# Patient Record
Sex: Male | Born: 1960
Health system: Southern US, Community
[De-identification: ages and names within clinical notes are randomized; demographics above are authoritative.]

## PROBLEM LIST (undated history)

## (undated) DIAGNOSIS — K219 Gastro-esophageal reflux disease without esophagitis: Secondary | ICD-10-CM

## (undated) DIAGNOSIS — M4802 Spinal stenosis, cervical region: Secondary | ICD-10-CM

## (undated) DIAGNOSIS — I10 Essential (primary) hypertension: Secondary | ICD-10-CM

## (undated) DIAGNOSIS — N189 Chronic kidney disease, unspecified: Secondary | ICD-10-CM

## (undated) DIAGNOSIS — I509 Heart failure, unspecified: Secondary | ICD-10-CM

## (undated) DIAGNOSIS — G4730 Sleep apnea, unspecified: Secondary | ICD-10-CM

## (undated) DIAGNOSIS — C801 Malignant (primary) neoplasm, unspecified: Secondary | ICD-10-CM

## (undated) DIAGNOSIS — E785 Hyperlipidemia, unspecified: Secondary | ICD-10-CM

## (undated) DIAGNOSIS — E079 Disorder of thyroid, unspecified: Secondary | ICD-10-CM

## (undated) DIAGNOSIS — F419 Anxiety disorder, unspecified: Secondary | ICD-10-CM

## (undated) DIAGNOSIS — F329 Major depressive disorder, single episode, unspecified: Secondary | ICD-10-CM

## (undated) DIAGNOSIS — F32A Depression, unspecified: Secondary | ICD-10-CM

## (undated) DIAGNOSIS — E039 Hypothyroidism, unspecified: Secondary | ICD-10-CM

## (undated) HISTORY — PX: TONSILLECTOMY: SUR1361

## (undated) HISTORY — DX: Depression, unspecified: F32.A

## (undated) HISTORY — DX: Gastro-esophageal reflux disease without esophagitis: K21.9

## (undated) HISTORY — DX: Essential (primary) hypertension: I10

## (undated) HISTORY — DX: Spinal stenosis, cervical region: M48.02

## (undated) HISTORY — DX: Chronic kidney disease, unspecified: N18.9

## (undated) HISTORY — DX: Hyperlipidemia, unspecified: E78.5

## (undated) HISTORY — DX: Anxiety disorder, unspecified: F41.9

## (undated) HISTORY — DX: Disorder of thyroid, unspecified: E07.9

## (undated) HISTORY — PX: WISDOM TOOTH EXTRACTION: SHX21

## (undated) HISTORY — PX: OTHER SURGICAL HISTORY: SHX169

## (undated) HISTORY — DX: Morbid (severe) obesity due to excess calories: E66.01

## (undated) HISTORY — DX: Major depressive disorder, single episode, unspecified: F32.9

---

## 2008-09-15 ENCOUNTER — Ambulatory Visit (HOSPITAL_COMMUNITY): Admission: RE | Admit: 2008-09-15 | Discharge: 2008-09-15 | Payer: Self-pay | Admitting: Internal Medicine

## 2008-09-15 ENCOUNTER — Ambulatory Visit: Payer: Self-pay | Admitting: Internal Medicine

## 2008-10-09 HISTORY — PX: COLONOSCOPY WITH ESOPHAGOGASTRODUODENOSCOPY (EGD): SHX5779

## 2008-11-17 DIAGNOSIS — Z9089 Acquired absence of other organs: Secondary | ICD-10-CM | POA: Insufficient documentation

## 2008-11-17 DIAGNOSIS — K219 Gastro-esophageal reflux disease without esophagitis: Secondary | ICD-10-CM

## 2008-11-17 DIAGNOSIS — R1032 Left lower quadrant pain: Secondary | ICD-10-CM

## 2008-11-17 DIAGNOSIS — J40 Bronchitis, not specified as acute or chronic: Secondary | ICD-10-CM

## 2008-11-17 DIAGNOSIS — R112 Nausea with vomiting, unspecified: Secondary | ICD-10-CM

## 2008-11-17 DIAGNOSIS — E039 Hypothyroidism, unspecified: Secondary | ICD-10-CM | POA: Insufficient documentation

## 2008-11-17 DIAGNOSIS — R197 Diarrhea, unspecified: Secondary | ICD-10-CM

## 2009-02-23 ENCOUNTER — Ambulatory Visit: Payer: Self-pay | Admitting: Internal Medicine

## 2009-02-23 ENCOUNTER — Encounter: Payer: Self-pay | Admitting: Gastroenterology

## 2009-02-23 DIAGNOSIS — Z8719 Personal history of other diseases of the digestive system: Secondary | ICD-10-CM

## 2009-02-26 LAB — CONVERTED CEMR LAB
Eosinophils Relative: 4 % (ref 0–5)
HCT: 43.1 % (ref 39.0–52.0)
Hemoglobin: 14.8 g/dL (ref 13.0–17.0)
Lymphocytes Relative: 43 % (ref 12–46)
Lymphs Abs: 1.9 10*3/uL (ref 0.7–4.0)
Monocytes Absolute: 0.5 10*3/uL (ref 0.1–1.0)
RBC: 5.14 M/uL (ref 4.22–5.81)
Tissue Transglutaminase Ab, IgA: 0.3 units (ref <7)
WBC: 4.5 10*3/uL (ref 4.0–10.5)

## 2009-03-04 ENCOUNTER — Encounter: Payer: Self-pay | Admitting: Internal Medicine

## 2009-03-04 ENCOUNTER — Telehealth (INDEPENDENT_AMBULATORY_CARE_PROVIDER_SITE_OTHER): Payer: Self-pay | Admitting: *Deleted

## 2009-03-04 ENCOUNTER — Ambulatory Visit: Payer: Self-pay | Admitting: Internal Medicine

## 2009-03-04 ENCOUNTER — Ambulatory Visit (HOSPITAL_COMMUNITY): Admission: RE | Admit: 2009-03-04 | Discharge: 2009-03-04 | Payer: Self-pay | Admitting: Internal Medicine

## 2009-03-05 ENCOUNTER — Encounter: Payer: Self-pay | Admitting: Internal Medicine

## 2009-05-14 ENCOUNTER — Ambulatory Visit: Payer: Self-pay | Admitting: Internal Medicine

## 2009-05-14 DIAGNOSIS — K589 Irritable bowel syndrome without diarrhea: Secondary | ICD-10-CM

## 2009-06-15 ENCOUNTER — Encounter: Payer: Self-pay | Admitting: Internal Medicine

## 2009-07-23 ENCOUNTER — Encounter: Payer: Self-pay | Admitting: Urgent Care

## 2009-09-20 ENCOUNTER — Ambulatory Visit: Payer: Self-pay | Admitting: Psychiatry

## 2009-09-20 ENCOUNTER — Inpatient Hospital Stay (HOSPITAL_COMMUNITY): Admission: RE | Admit: 2009-09-20 | Discharge: 2009-09-23 | Payer: Self-pay | Admitting: Psychiatry

## 2010-06-15 ENCOUNTER — Observation Stay (HOSPITAL_COMMUNITY)
Admission: EM | Admit: 2010-06-15 | Discharge: 2010-06-16 | Payer: Self-pay | Source: Home / Self Care | Admitting: Emergency Medicine

## 2010-06-28 ENCOUNTER — Ambulatory Visit: Payer: Self-pay | Admitting: Cardiology

## 2010-06-28 ENCOUNTER — Encounter: Payer: Self-pay | Admitting: Adult Health

## 2010-06-28 DIAGNOSIS — R079 Chest pain, unspecified: Secondary | ICD-10-CM

## 2010-06-28 DIAGNOSIS — F319 Bipolar disorder, unspecified: Secondary | ICD-10-CM | POA: Insufficient documentation

## 2010-07-07 ENCOUNTER — Ambulatory Visit: Payer: Self-pay | Admitting: Cardiology

## 2010-07-26 ENCOUNTER — Ambulatory Visit: Payer: Self-pay | Admitting: Cardiology

## 2010-11-08 NOTE — Assessment & Plan Note (Signed)
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  He followed up with [place] and a stress test was ordered.  He is here for the results.  He has been asymptomatic since being seen without recurrent chest pain.  Current Medications (verified): 1)  Synthroid 175 Mcg Tabs (Levothyroxine Sodium) .... Once Daily 2)  Lomotil 2.5-0.025 Mg Tabs (Diphenoxylate-Atropine) .... Take As Needed 3)  Promethazine Hcl 25 Mg Tabs (Promethazine Hcl) .... One By Mouth Every 4-6 Hours As Needed For N/v 4)  Multivitamin .... Take 1 Tablet By Mouth Once A Day 5)  Alprazolam 1 Mg Tabs (Alprazolam) .... Take 1 Tab Two Times A Day 6)  Lexapro 20 Mg Tabs (Escitalopram Oxalate) .... Take 1 Tab Daily 7)  Abilify 5 Mg Tabs (Aripiprazole) .... Take 1/2 Tab Daily 8)  Nitrostat 0.4 Mg Subl (Nitroglycerin) .Marland Kitchen.. 1 Tablet Under Tongue At Onset of Chest Pain; You May Repeat Every 5 Minutes For Up To 3 Doses. 9)  Trazodone Hcl 100 Mg Tabs (Trazodone Hcl) .Marland Kitchen.. 1 By Mouth At Bedtime 10)  Nexium 40 Mg Cpdr (Esomeprazole Magnesium) .Marland Kitchen.. 1daily  Allergies (verified): No Known Drug Allergies  Comments:  Nurse/Medical Assistant: reviewed meds with patient from previous ov didn't bring meds or list   Review of Systems       All other systems have been reviewed and are negative unless stated above.   Vital Signs:  Patient profile:   50 year old male Weight:      324 pounds BMI:     45.35 Pulse rate:   50 / minute BP sitting:   124 / 80  (right arm)  Vitals Entered By: Dreama Saa, CNA (July 26, 2010 11:13 AM)  Physical Exam  General:  Well developed, well nourished, in no acute distress. Lungs:  Clear bilaterally to auscultation and percussion. Heart:  Non-displaced PMI, chest non-tender; regular rate and  rhythm, S1, S2 without murmurs, rubs or gallops. Carotid upstroke normal, no bruit. Normal abdominal aortic size, no bruits. Femorals normal pulses, no bruits. Pedals normal pulses. No edema, no varicosities. Abdomen:  Obese Pulses:  pulses normal in all 4 extremities Neurologic:  Alert and oriented x 3. Psych:  Normal affect.   Impression & Recommendations:  Problem # 1:  CHEST PAIN (ICD-786.50) Review of stress test demonstrates no evidence of ischemia by ST analysis.  He is given these results and reassurance is given to him and his wife.  He is advised to lose weight and exercise more in order to improve overall health status.  He verbalizes understanding.  We will see him on a as needed basis. His updated medication list for this problem includes:    Nitrostat 0.4 Mg Subl (Nitroglycerin) .Marland Kitchen... 1 tablet under tongue at onset of chest pain; you may repeat every 5 minutes for up to 3 doses.  Patient Instructions: 1)  Your physician recommends that you schedule a follow-up appointment in: as needed  2)  Your physician recommends that you continue on your current medications as directed. Please refer to the Current Medication list given to you today.
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
## 2010-12-14 ENCOUNTER — Inpatient Hospital Stay (HOSPITAL_COMMUNITY)
Admission: RE | Admit: 2010-12-14 | Discharge: 2010-12-17 | DRG: 885 | Disposition: A | Discharge status: Home / Self Care | Payer: 59 | Attending: Psychiatry | Admitting: Psychiatry

## 2010-12-14 DIAGNOSIS — Z818 Family history of other mental and behavioral disorders: Secondary | ICD-10-CM

## 2010-12-14 DIAGNOSIS — E039 Hypothyroidism, unspecified: Secondary | ICD-10-CM

## 2010-12-14 DIAGNOSIS — K219 Gastro-esophageal reflux disease without esophagitis: Secondary | ICD-10-CM

## 2010-12-14 DIAGNOSIS — R45850 Homicidal ideations: Secondary | ICD-10-CM

## 2010-12-14 DIAGNOSIS — I10 Essential (primary) hypertension: Secondary | ICD-10-CM

## 2010-12-14 DIAGNOSIS — F411 Generalized anxiety disorder: Secondary | ICD-10-CM

## 2010-12-14 DIAGNOSIS — F332 Major depressive disorder, recurrent severe without psychotic features: Principal | ICD-10-CM

## 2010-12-14 DIAGNOSIS — R45851 Suicidal ideations: Secondary | ICD-10-CM

## 2010-12-14 DIAGNOSIS — E669 Obesity, unspecified: Secondary | ICD-10-CM

## 2010-12-14 LAB — CBC
HCT: 49.8 % (ref 39.0–52.0)
Hemoglobin: 17.4 g/dL — ABNORMAL HIGH (ref 13.0–17.0)
RDW: 13.4 % (ref 11.5–15.5)
WBC: 10.2 10*3/uL (ref 4.0–10.5)

## 2010-12-14 LAB — COMPREHENSIVE METABOLIC PANEL
ALT: 31 U/L (ref 0–53)
Alkaline Phosphatase: 77 U/L (ref 39–117)
Glucose, Bld: 102 mg/dL — ABNORMAL HIGH (ref 70–99)
Potassium: 3.7 mEq/L (ref 3.5–5.1)
Sodium: 141 mEq/L (ref 135–145)
Total Protein: 7.3 g/dL (ref 6.0–8.3)

## 2010-12-15 DIAGNOSIS — F332 Major depressive disorder, recurrent severe without psychotic features: Secondary | ICD-10-CM

## 2010-12-15 LAB — URINE DRUGS OF ABUSE SCREEN W ALC, ROUTINE (REF LAB)
Barbiturate Quant, Ur: NEGATIVE
Marijuana Metabolite: NEGATIVE
Methadone: NEGATIVE
Phencyclidine (PCP): NEGATIVE
Propoxyphene: NEGATIVE

## 2010-12-15 LAB — TSH: TSH: 11.645 u[IU]/mL — ABNORMAL HIGH (ref 0.350–4.500)

## 2010-12-18 LAB — BENZODIAZEPINE, QUANTITATIVE, URINE
Nordiazepam GC/MS Conf: NEGATIVE NG/ML
Oxazepam GC/MS Conf: NEGATIVE NG/ML
Temazepam GC/MS Conf: NEGATIVE NG/ML
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
## 2010-12-22 LAB — COMPREHENSIVE METABOLIC PANEL
ALT: 22 U/L (ref 0–53)
AST: 25 U/L (ref 0–37)
Albumin: 3.2 g/dL — ABNORMAL LOW (ref 3.5–5.2)
Alkaline Phosphatase: 58 U/L (ref 39–117)
BUN: 8 mg/dL (ref 6–23)
Chloride: 110 mEq/L (ref 96–112)
Potassium: 3.8 mEq/L (ref 3.5–5.1)
Sodium: 141 mEq/L (ref 135–145)
Total Bilirubin: 0.7 mg/dL (ref 0.3–1.2)
Total Protein: 5.9 g/dL — ABNORMAL LOW (ref 6.0–8.3)

## 2010-12-22 LAB — CBC
Hemoglobin: 16.6 g/dL (ref 13.0–17.0)
MCV: 90 fL (ref 78.0–100.0)
Platelets: 153 10*3/uL (ref 150–400)
Platelets: 167 10*3/uL (ref 150–400)
RBC: 4.78 MIL/uL (ref 4.22–5.81)
RBC: 5.39 MIL/uL (ref 4.22–5.81)
RDW: 13.5 % (ref 11.5–15.5)
WBC: 6.2 10*3/uL (ref 4.0–10.5)

## 2010-12-22 LAB — BASIC METABOLIC PANEL
Calcium: 8.9 mg/dL (ref 8.4–10.5)
GFR calc Af Amer: >60 mL/min (ref >60)
GFR calc non Af Amer: 59 mL/min — ABNORMAL LOW (ref >60)
Sodium: 138 mEq/L (ref 135–145)

## 2010-12-22 LAB — LIPID PANEL
LDL Cholesterol: 104 mg/dL — ABNORMAL HIGH (ref 0–99)
Total CHOL/HDL Ratio: 5.7 RATIO
VLDL: 33 mg/dL (ref 0–40)

## 2010-12-22 LAB — DIFFERENTIAL
Basophils Absolute: 0 10*3/uL (ref 0.0–0.1)
Basophils Relative: 1 % (ref 0–1)
Basophils Relative: 2 % — ABNORMAL HIGH (ref 0–1)
Eosinophils Absolute: 0.2 10*3/uL (ref 0.0–0.7)
Eosinophils Relative: 3 % (ref 0–5)
Lymphs Abs: 2.5 10*3/uL (ref 0.7–4.0)
Monocytes Absolute: 0.5 10*3/uL (ref 0.1–1.0)
Monocytes Relative: 7 % (ref 3–12)
Monocytes Relative: 8 % (ref 3–12)
Neutro Abs: 3.1 10*3/uL (ref 1.7–7.7)
Neutrophils Relative %: 49 % (ref 43–77)

## 2010-12-22 LAB — CK TOTAL AND CKMB (NOT AT ARMC)
Relative Index: INVALID (ref 0.0–2.5)
Total CK: 76 U/L (ref 7–232)

## 2010-12-22 LAB — POCT CARDIAC MARKERS
CKMB, poc: <1 ng/mL — ABNORMAL LOW (ref 1.0–8.0)
Myoglobin, poc: 67.4 ng/mL (ref 12–200)

## 2010-12-22 LAB — CARDIAC PANEL(CRET KIN+CKTOT+MB+TROPI)
CK, MB: 1.8 ng/mL (ref 0.3–4.0)
Relative Index: INVALID (ref 0.0–2.5)
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
## 2011-01-05 ENCOUNTER — Ambulatory Visit (INDEPENDENT_AMBULATORY_CARE_PROVIDER_SITE_OTHER): Payer: Commercial Managed Care - PPO | Admitting: Psychiatry

## 2011-01-05 DIAGNOSIS — F332 Major depressive disorder, recurrent severe without psychotic features: Secondary | ICD-10-CM
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
## 2011-01-10 LAB — BENZODIAZEPINE, QUANTITATIVE, URINE
Alprazolam (GC/LC/MS), ur confirm: 600 ng/mL
Nordiazepam GC/MS Conf: NEGATIVE
Oxazepam GC/MS Conf: NEGATIVE

## 2011-01-10 LAB — CBC
HCT: 50.8 % (ref 39.0–52.0)
MCV: 89.5 fL (ref 78.0–100.0)
Platelets: 185 10*3/uL (ref 150–400)
RDW: 13.6 % (ref 11.5–15.5)
WBC: 7.7 10*3/uL (ref 4.0–10.5)

## 2011-01-10 LAB — COMPREHENSIVE METABOLIC PANEL
Albumin: 3.9 g/dL (ref 3.5–5.2)
BUN: 6 mg/dL (ref 6–23)
Calcium: 9.2 mg/dL (ref 8.4–10.5)
Creatinine, Ser: 1.25 mg/dL (ref 0.4–1.5)
Total Bilirubin: 1 mg/dL (ref 0.3–1.2)
Total Protein: 7.9 g/dL (ref 6.0–8.3)

## 2011-01-10 LAB — DRUGS OF ABUSE SCREEN W/O ALC, ROUTINE URINE
Cocaine Metabolites: NEGATIVE
Marijuana Metabolite: NEGATIVE
Methadone: NEGATIVE
Phencyclidine (PCP): NEGATIVE
Propoxyphene: NEGATIVE

## 2011-01-17 LAB — FECAL LACTOFERRIN, QUANT

## 2011-01-17 LAB — CLOSTRIDIUM DIFFICILE EIA: C difficile Toxins A+B, EIA: NEGATIVE

## 2011-01-17 LAB — H. PYLORI ANTIBODY, IGG: H Pylori IgG: 1 {ISR} — ABNORMAL HIGH

## 2011-01-17 LAB — STOOL CULTURE

## 2011-01-17 LAB — OVA AND PARASITE EXAMINATION

## 2011-01-19 ENCOUNTER — Encounter (INDEPENDENT_AMBULATORY_CARE_PROVIDER_SITE_OTHER): Payer: Commercial Managed Care - PPO | Admitting: Psychiatry

## 2011-01-19 DIAGNOSIS — F331 Major depressive disorder, recurrent, moderate: Secondary | ICD-10-CM

## 2011-01-23 ENCOUNTER — Ambulatory Visit (HOSPITAL_COMMUNITY)
Admission: RE | Admit: 2011-01-23 | Discharge: 2011-01-23 | Disposition: A | Discharge status: Home / Self Care | Payer: 59 | Attending: Psychiatry | Admitting: Psychiatry

## 2011-01-23 DIAGNOSIS — F332 Major depressive disorder, recurrent severe without psychotic features: Secondary | ICD-10-CM | POA: Insufficient documentation

## 2011-01-24 ENCOUNTER — Encounter (HOSPITAL_COMMUNITY): Payer: 59 | Attending: Psychiatry | Admitting: Psychiatry

## 2011-01-24 DIAGNOSIS — F332 Major depressive disorder, recurrent severe without psychotic features: Secondary | ICD-10-CM | POA: Insufficient documentation

## 2011-01-24 DIAGNOSIS — K219 Gastro-esophageal reflux disease without esophagitis: Secondary | ICD-10-CM | POA: Insufficient documentation

## 2011-01-24 DIAGNOSIS — E039 Hypothyroidism, unspecified: Secondary | ICD-10-CM | POA: Insufficient documentation

## 2011-01-25 ENCOUNTER — Encounter (HOSPITAL_COMMUNITY): Payer: 59 | Admitting: Psychiatry

## 2011-01-25 ENCOUNTER — Encounter (HOSPITAL_BASED_OUTPATIENT_CLINIC_OR_DEPARTMENT_OTHER): Payer: 59 | Admitting: Psychiatry

## 2011-01-25 DIAGNOSIS — F332 Major depressive disorder, recurrent severe without psychotic features: Secondary | ICD-10-CM

## 2011-01-25 DIAGNOSIS — F411 Generalized anxiety disorder: Secondary | ICD-10-CM

## 2011-01-26 ENCOUNTER — Encounter (HOSPITAL_COMMUNITY): Payer: 59 | Admitting: Psychiatry

## 2011-01-26 ENCOUNTER — Other Ambulatory Visit (HOSPITAL_COMMUNITY): Payer: 59 | Attending: Psychiatry | Admitting: Psychiatry

## 2011-01-27 ENCOUNTER — Encounter (HOSPITAL_COMMUNITY): Payer: 59 | Admitting: Psychiatry

## 2011-01-30 ENCOUNTER — Encounter (HOSPITAL_COMMUNITY): Payer: 59 | Admitting: Psychiatry

## 2011-01-31 ENCOUNTER — Encounter (HOSPITAL_COMMUNITY): Payer: 59 | Admitting: Psychiatry

## 2011-02-01 ENCOUNTER — Encounter (HOSPITAL_COMMUNITY): Payer: 59 | Admitting: Psychiatry

## 2011-02-02 ENCOUNTER — Encounter (HOSPITAL_COMMUNITY): Payer: 59 | Admitting: Psychiatry

## 2011-02-03 ENCOUNTER — Encounter (HOSPITAL_COMMUNITY): Payer: 59 | Admitting: Psychiatry

## 2011-02-06 ENCOUNTER — Encounter (HOSPITAL_COMMUNITY): Payer: 59 | Admitting: Psychiatry

## 2011-02-07 ENCOUNTER — Encounter (HOSPITAL_COMMUNITY): Payer: 59 | Attending: Psychiatry | Admitting: Psychiatry

## 2011-02-07 ENCOUNTER — Encounter (HOSPITAL_COMMUNITY): Payer: 59 | Admitting: Psychiatry

## 2011-02-07 DIAGNOSIS — K219 Gastro-esophageal reflux disease without esophagitis: Secondary | ICD-10-CM | POA: Insufficient documentation

## 2011-02-07 DIAGNOSIS — E039 Hypothyroidism, unspecified: Secondary | ICD-10-CM | POA: Insufficient documentation

## 2011-02-07 DIAGNOSIS — F332 Major depressive disorder, recurrent severe without psychotic features: Secondary | ICD-10-CM | POA: Insufficient documentation

## 2011-02-08 ENCOUNTER — Encounter (HOSPITAL_COMMUNITY): Payer: 59 | Admitting: Psychiatry

## 2011-02-09 ENCOUNTER — Encounter (HOSPITAL_COMMUNITY): Payer: 59 | Admitting: Psychiatry

## 2011-02-10 ENCOUNTER — Encounter (HOSPITAL_COMMUNITY): Payer: 59 | Admitting: Psychiatry

## 2011-02-13 ENCOUNTER — Encounter (HOSPITAL_COMMUNITY): Payer: 59 | Admitting: Psychiatry

## 2011-02-14 ENCOUNTER — Encounter (HOSPITAL_COMMUNITY): Payer: 59 | Admitting: Psychiatry

## 2011-02-15 ENCOUNTER — Encounter (HOSPITAL_COMMUNITY): Payer: 59 | Admitting: Psychiatry

## 2011-02-16 ENCOUNTER — Encounter (HOSPITAL_COMMUNITY): Payer: 59 | Admitting: Psychiatry

## 2011-02-17 ENCOUNTER — Encounter (HOSPITAL_COMMUNITY): Payer: 59 | Admitting: Psychiatry

## 2011-02-20 ENCOUNTER — Encounter (HOSPITAL_COMMUNITY): Payer: 59 | Admitting: Psychiatry

## 2011-02-21 ENCOUNTER — Encounter (HOSPITAL_COMMUNITY): Payer: 59 | Admitting: Psychiatry

## 2011-02-21 ENCOUNTER — Ambulatory Visit (INDEPENDENT_AMBULATORY_CARE_PROVIDER_SITE_OTHER): Payer: 59 | Admitting: Psychiatry

## 2011-02-21 DIAGNOSIS — F331 Major depressive disorder, recurrent, moderate: Secondary | ICD-10-CM

## 2011-02-21 NOTE — Assessment & Plan Note (Signed)
Greg Kemp, Greg Kemp                    CHART#:  [id number]   DATE:  09/15/2008                       DOB:  09-27-1961   REQUESTING PHYSICIAN:  Catalina Pizza, MD   REASON FOR CONSULTATION:  A 52-month history of diarrhea, nausea,  vomiting, and abdominal pain.   HISTORY OF PRESENT ILLNESS:  The patient is a 50 year old obese  Caucasian male.  For the last 4 months, he has had intermittent diarrhea  which is generally 6-8 times per day.  He has also noticed ribbon-like  stool.  He has noticed some mucus in his stool.  His stools are loose  and watery.  He denies any blood in his stools or melena.  He has had  some constipation as well where he can go a day or two without a bowel  movement and his bowels seem to alternate.  He has lower abdominal pain  all the time, it is usually left lower quadrant or suprapubic.  He rates  the pain anywhere from 1-9 on pain scale.  He has actually gained 27  pounds in the last 4 months with these symptoms.  He has tried Imodium  which does seem to help some, but it tends to constipate him.  He has  normal bowel habits were once daily or every other day previously.  He  denies any fever or chills.  He has had nausea and vomiting several days  per week.  He has not had a formed bowel movement in the last 4 months.  He denies any ill contacts.  Denies any new pets.  Denies any foreign  travel.  His only new medications are that of Protonix and Lomotil for  which he started for these problems.  He has complained of metallic  acidic taste in his mouth, but denies any heartburn or indigestion.  His  symptoms have awakened him from sleeping at times.  He is taking  Phenergan for the nausea.  Lab work from September 07, 2008, shows a CBC  with a white blood cell count of 5.7, hemoglobin 16.3, hematocrit 46.8,  platelets 158, percent lymphocytes 51, monocytes 15, and granulocytes  30.  Amylase is 47, lipase 51, and TSH 2.497.  LFTs were normal except  an AST of 42.  He  had a normal MET-7.  He had a C. diff which was  negative.  He had a negative lactoferrin.   PAST MEDICAL AND SURGICAL HISTORY:  Hypothyroidism, GERD, bronchitis,  and status post tonsillectomy.   CURRENT MEDICATIONS:  Synthroid unknown dose daily, Protonix 40 mg  daily, Lomotil p.r.n., and Phenergan p.r.n.   ALLERGIES:  No known drug allergies.   FAMILY HISTORY:  There is no known family history of colorectal  carcinoma, liver, or chronic GI problems.  Mother deceased at 52  secondary to COPD.  Father at age 52 and healthy.  He has one healthy  half-sister.   SOCIAL HISTORY:  He has been married for 31 years.  He has 5 children,  one of which is a foster child.  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  He moved to [place] over a year ago to move  closer to his grandchildren.  Denies any tobacco, alcohol, or drug use.   REVIEW OF SYSTEMS:  See HPI, otherwise  negative.   PHYSICAL EXAMINATION:  VITAL SIGNS:  Weight 302.5 pounds, height 71  inches, temperature 97.8, blood pressure 120/88, and pulse 84.  GENERAL:  He is an obese Caucasian male, who is alert, oriented,  pleasant, and cooperative, no acute distress.  He is accompanied by wife  today.  HEENT:  Sclerae clear and nonicteric.  Conjunctivae pink.  Oropharynx  pink and moist without any lesions.  NECK:  Supple without mass or thyromegaly.  CARDIAC:  Regular rate and rhythm.  Normal S1 and S2.  No murmurs,  clicks, rubs, or gallops.  LUNGS:  Clear to auscultation bilaterally.  ABDOMEN:  Protuberant with positive bowel sounds x4.  No bruits  auscultated.  Abdomen is soft.  He has mild distention in the left lower  quadrant with palpable fullness.  He has moderate tenderness to left  lower quadrant and to the suprapubic area on deep palpation as well as  around the umbilicus.  There is no rebound, tenderness, or guarding.  No  hepatosplenomegaly or mass, although exam is limited given the patient's  body  habitus.  EXTREMITIES:  Without clubbing or edema.   IMPRESSION:  The patient is a  50 year old Caucasian male with a 52-month  history of intermittent diarrhea, severe abdominal pain, nausea, and  vomiting.  He is quite tender in his left lower quadrant and lower  abdomen on deep palpation.  Interestingly, he has a myriad of  gastrointestinal symptoms.  However, his weight continues to climb and  he has reported 27-pound weight gain in the last 4 months which is  inconsistent with his history.  Given his severe pain, I suspect he may  have diverticulitis as a culprit, I cannot rule out infectious colitis  as a culprit and I do not have stool studies from Dr. Scharlene Gloss office yet.  Less likely it would be inflammatory bowel disease given the fact he has  not had noted any bleeding.  He may have an element of gastroesophageal  reflux disease.  He is going to require further evaluation given the  severity of his pain and the frequency of his diarrhea.   PLAN:  CT scan of the abdomen and pelvis with IV and oral contrast as  soon as possible.  I will call him with the results and we can go from  there.  If CT scan is negative, we will initiate treatment with  antispasmodics, PPI, and fiber; and if there is no improvement, and he  continues to have diarrhea, he is going to need colonoscopy plus/minus  EGD.      Lorenza Burton, N.P.  Electronically Signed     R. Roetta Sessions, M.D.  Electronically Signed   KJ/MEDQ  D:  09/15/2008  T:  09/16/2008  Job:  [id number]   cc:   Catalina Pizza, M.D.

## 2011-02-21 NOTE — Op Note (Signed)
Greg Kemp, Greg Kemp                 ACCOUNT NO.:  [id number]   MEDICAL RECORD NO.:  [id number]          PATIENT TYPE:  AMB   LOCATION:  [facility]                           FACILITY:  [facility]   PHYSICIAN:  R. Roetta Sessions, M.D. DATE OF BIRTH:  July 23, 1961   DATE OF PROCEDURE:  03/04/2009  DATE OF DISCHARGE:                               OPERATIVE REPORT   PROCEDURE:  Diagnostic esophagogastroduodenoscopy followed by diagnostic  ileal colonoscopy.   INDICATIONS FOR PROCEDURE:  A 50 year old gentleman with intermittent  dark stools, chronic gastroesophageal reflux disease, intermittent  nausea and vomiting was also has intermittent nonbloody diarrhea.  Unfortunately, his 55 year old daughter just recently succumbed to  metastatic colorectal cancer.  EGD and colonoscopy are now being done.  Risks, benefits, alternatives and limitations have been discussed,  questions answered.  Please see the documentation in the medical record.   PROCEDURE NOTE:  O2 saturation, blood pressure, pulse, respirations were  monitored throughout the entirety of both procedures.   CONSCIOUS SEDATION:  Versed 6 mg IV, Demerol 125 mg IV in divided doses.  Cetacaine spray for topical pharyngeal anesthesia.   INSTRUMENT:  Pentax video chip system.   FINDINGS:  EGD examination of the tubular esophagus revealed a  noncritical Schatzki ring, somewhat patulous EG junction, 4-quadrant  distal esophageal erosions with one area of focal ulceration.  EG  junction was patent and easily traversed.   Stomach:  Gastric cavity was empty.  It insufflated well with air.  Thorough examination of the gastric mucosa including retroflexion of the  proximal stomach and esophagogastric junction demonstrated a small  hiatal hernia.  Otherwise, the gastric mucosa appeared unremarkable.  Pylorus was patent and easily traversed.  Examination of the bulb and  second portion revealed multiple bulbar erosions with surrounding  erythema.  There  was no frank ulcer.  D2 and D3 appeared otherwise  grossly normal.   THERAPEUTIC/DIAGNOSTIC MANEUVERS:  Biopsies of D2 and D3 were taken for  histologic study.  The patient tolerated the procedure well and was  prepared for colonoscopy.  Digital rectal exam revealed no  abnormalities.   FINDINGS:  Prep was adequate.   Colon:  Colonic mucosa was surveyed from the rectosigmoid junction  through the left, transverse, right colon to the appendiceal orifice,  ileocecal valve and cecum.  These structures were well seen,  photographed for the record.  Terminal ileum was intubated at 5 cm.  From this level the scope was slowly withdrawn.  All previous mucosal  surfaces were again seen.  The patient was noted have narrow mouthed,  shallow left-sided diverticula.  The colonic mucosa appeared normal as  did the terminal ileum (prominent lymphoid aggregates).  Scope was  pulled down to the rectum.  Thorough examination of rectal mucosa  including retroflexed view of the anal verge demonstrated no  abnormalities.  Stool sample was submitted to lab.  The patient  tolerated both procedures well and was reactive at endoscopy.   IMPRESSION:  1. Ulcerative/erosive reflux esophagitis, noncritical Schatzki's ring,      hiatal hernia, otherwise normal stomach.  Multiple bulbar erosions.  Otherwise D1-D3 appeared normal status post biopsy D2-D3.  2. Colonoscopy findings:  Normal rectum, shallow narrow-mouthed left-      sided diverticula.  Colonic mucosa, terminal ileal mucosa appeared      normal.  Status post stool sample collection.   RECOMMENDATIONS:  1. Stop Protonix, begin Dexilant 60 mg orally twice daily before      breakfast and supper for 2 weeks and then drop back to once daily      before breakfast thereafter.  Antireflux literature provided to Mr.      Wragg.  2. Obtain H. pylori serologies today.  3. Follow up on pathology.  4. Diverticulosis literature provided to Mr. Liebler.  5.  Follow up on stool studies.  6. Further recommendations to follow.      Jonathon Bellows, M.D.  Electronically Signed     RMR/MEDQ  D:  03/04/2009  T:  03/04/2009  Job:  [id number]   cc:   R. Roetta Sessions, M.D.  [address]  [place]   [address]

## 2011-02-22 ENCOUNTER — Encounter (HOSPITAL_COMMUNITY): Payer: 59 | Admitting: Psychiatry

## 2011-02-28 ENCOUNTER — Encounter (INDEPENDENT_AMBULATORY_CARE_PROVIDER_SITE_OTHER): Payer: 59 | Admitting: Psychiatry

## 2011-02-28 DIAGNOSIS — F331 Major depressive disorder, recurrent, moderate: Secondary | ICD-10-CM

## 2011-03-28 ENCOUNTER — Encounter (HOSPITAL_COMMUNITY): Payer: 59 | Admitting: Psychiatry

## 2011-05-02 ENCOUNTER — Encounter (INDEPENDENT_AMBULATORY_CARE_PROVIDER_SITE_OTHER): Payer: 59 | Admitting: Psychiatry

## 2011-05-02 DIAGNOSIS — F331 Major depressive disorder, recurrent, moderate: Secondary | ICD-10-CM

## 2011-06-08 ENCOUNTER — Encounter (INDEPENDENT_AMBULATORY_CARE_PROVIDER_SITE_OTHER): Payer: 59 | Admitting: Psychiatry

## 2011-06-08 DIAGNOSIS — F331 Major depressive disorder, recurrent, moderate: Secondary | ICD-10-CM

## 2011-06-13 ENCOUNTER — Encounter (HOSPITAL_COMMUNITY): Payer: 59 | Admitting: Psychiatry

## 2011-07-06 ENCOUNTER — Encounter (HOSPITAL_COMMUNITY): Payer: 59 | Admitting: Psychiatry

## 2011-07-12 ENCOUNTER — Ambulatory Visit (INDEPENDENT_AMBULATORY_CARE_PROVIDER_SITE_OTHER): Payer: 59 | Admitting: Psychiatry

## 2011-07-12 DIAGNOSIS — F332 Major depressive disorder, recurrent severe without psychotic features: Secondary | ICD-10-CM

## 2011-07-17 ENCOUNTER — Encounter (INDEPENDENT_AMBULATORY_CARE_PROVIDER_SITE_OTHER): Payer: 59 | Admitting: Psychiatry

## 2011-07-17 DIAGNOSIS — F332 Major depressive disorder, recurrent severe without psychotic features: Secondary | ICD-10-CM

## 2011-07-18 ENCOUNTER — Encounter (HOSPITAL_COMMUNITY): Payer: 59 | Admitting: Psychiatry

## 2011-07-27 ENCOUNTER — Encounter (INDEPENDENT_AMBULATORY_CARE_PROVIDER_SITE_OTHER): Payer: 59 | Admitting: Psychiatry

## 2011-07-27 DIAGNOSIS — F332 Major depressive disorder, recurrent severe without psychotic features: Secondary | ICD-10-CM

## 2011-08-01 ENCOUNTER — Encounter (HOSPITAL_COMMUNITY): Payer: 59 | Admitting: Psychiatry

## 2011-08-01 ENCOUNTER — Encounter (INDEPENDENT_AMBULATORY_CARE_PROVIDER_SITE_OTHER): Payer: 59 | Admitting: Psychiatry

## 2011-08-01 DIAGNOSIS — F331 Major depressive disorder, recurrent, moderate: Secondary | ICD-10-CM

## 2011-08-02 ENCOUNTER — Encounter (INDEPENDENT_AMBULATORY_CARE_PROVIDER_SITE_OTHER): Payer: 59 | Admitting: Psychiatry

## 2011-08-02 DIAGNOSIS — F332 Major depressive disorder, recurrent severe without psychotic features: Secondary | ICD-10-CM

## 2011-08-02 NOTE — Progress Notes (Signed)
Greg Kemp, Greg Kemp                 ACCOUNT NO.:  [id number]  MEDICAL RECORD NO.:  [id number]  LOCATION:  [facility]                           FACILITY:  [facility]  PHYSICIAN:  Alverna Fawley T. Kiamesha Samet, M.D.   DATE OF BIRTH:  Feb 09, 1961                                PROGRESS NOTE   The patient came in today for his appointment with his wife.  He continued to endorse depressive thoughts, anhedonia and decreased energy.  As per his wife, the patient has been laying down all the time at home and has no energy to do things.  Recently he attended the wedding of the son at [place], [place] but wife reported it was very hard for him and he does not feel that he enjoyed that much.  The patient continued to have crying spells, negative thoughts and recently flashback of his past in which he reported not a good relationship with his father.  He actually felt very embarrassed when he told the therapist that his father and mother were separated at a very young age, and he felt very uncomfortable talking about his father's behavior towards him.  On his last visit, we had increased his Wellbutrin to 300, which he has been taking.  Though he complained of very minor shakes with the increased Wellbutrin, but he did not report any other side effects.  He has been sleeping either too much or too less.  He has been out of work since early September.  Wife also noticed some decrease in ADLs and no desire to do anything.  PAST MEDICAL HISTORY: Patient has history of GERD, obesity, hypothyroidism.  CURRENT MEDICATIONS: He is on Synthroid 200 mcg, Nexium 40 mg daily, Xanax 1 mg 1 tablet q. week p.r.n. which according to his wife he has been taking on a regular basis, Cymbalta 60 mg daily, Wellbutrin XL 300 mg daily.  He is also on Ambien 10 mg only as needed for sleep, but lately he has not been taking those, as he has been sleeping too much.  His weight is 329 pounds.  MENTAL STATUS EXAMINATION: The patient is an  obese Caucasian man who is wearing jeans and a T- shirt.  He maintained poor eye contact.  His speech is very soft but clear and coherent.  He described his mood as anxious and depressed. His affect was constricted, though he denied any auditory hallucinations, suicidal thoughts or homicidal thoughts, but he has a lot of negative thoughts and on occasion hearing past memories and flashbacks.  However, there were no delusions or psychosis present at this time.  His attention and concentration were distracted at times. His speech is very soft and slow.  He is alert and oriented x3.  His insight and judgment is fair.  His impulse control was okay.  DIAGNOSIS: AXIS I:  Major depressive disorder, recurrent.  Rule out posttraumatic stress disorder. AXIS II:  Deferred. AXIS III:  See medical history. AXIS IV:  Moderate. AXIS V:  40-45.  PLAN: I talked to the patient and his wife in detail.  I do believe at this time the patient is not ready to go back to work.  We  will try a small dose of Abilify in addition with his medication; however, I recommended if he starts feeling shakes and tremors, then he needs to call us immediately.  We also explored the option of intensive outpatient program, though he will discuss with the therapist on his next visit on this issue.  I also talked about the safety plan that in any case he started having suicidal thoughts or homicidal thoughts or any active hallucination, then he needs to call 911 or go to local ER.  I will see him again in 10 days.     Aodhan Scheidt T. Lolly Mustache, M.D.     STA/MEDQ  D:  08/01/2011  T:  08/01/2011  Job:  [id number]  Electronically Signed by Kathryne Sharper M.D. on 08/02/2011 03:55:43 PM

## 2011-08-10 ENCOUNTER — Encounter (INDEPENDENT_AMBULATORY_CARE_PROVIDER_SITE_OTHER): Payer: 59 | Admitting: Psychiatry

## 2011-08-10 DIAGNOSIS — F331 Major depressive disorder, recurrent, moderate: Secondary | ICD-10-CM

## 2011-08-11 ENCOUNTER — Encounter (INDEPENDENT_AMBULATORY_CARE_PROVIDER_SITE_OTHER): Payer: 59 | Admitting: Psychiatry

## 2011-08-11 DIAGNOSIS — F332 Major depressive disorder, recurrent severe without psychotic features: Secondary | ICD-10-CM

## 2011-08-24 ENCOUNTER — Encounter (HOSPITAL_COMMUNITY): Payer: 59 | Admitting: Psychiatry

## 2011-08-24 ENCOUNTER — Encounter (HOSPITAL_COMMUNITY): Payer: Self-pay | Admitting: Psychiatry

## 2011-08-24 ENCOUNTER — Ambulatory Visit (INDEPENDENT_AMBULATORY_CARE_PROVIDER_SITE_OTHER): Payer: 59 | Admitting: Psychiatry

## 2011-08-24 DIAGNOSIS — F332 Major depressive disorder, recurrent severe without psychotic features: Secondary | ICD-10-CM
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
## 2011-08-24 NOTE — Patient Instructions (Signed)
Continue to improve self-care especially regarding nutrition and exercise.  Continue to use relaxation techniques.

## 2011-09-08 ENCOUNTER — Ambulatory Visit (INDEPENDENT_AMBULATORY_CARE_PROVIDER_SITE_OTHER): Payer: 59 | Admitting: Psychiatry

## 2011-09-08 ENCOUNTER — Encounter (HOSPITAL_COMMUNITY): Payer: Self-pay | Admitting: Psychiatry

## 2011-09-08 DIAGNOSIS — F332 Major depressive disorder, recurrent severe without psychotic features: Secondary | ICD-10-CM

## 2011-09-08 NOTE — Patient Instructions (Signed)
Continue efforts regarding self -care ( eating patterns and physical activity), use relaxation breathing and mindfulness exercise, chart moods (anxiety and depression)
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
## 2011-09-14 ENCOUNTER — Encounter (HOSPITAL_COMMUNITY): Payer: Self-pay | Admitting: Psychiatry

## 2011-09-14 ENCOUNTER — Encounter (HOSPITAL_COMMUNITY): Payer: 59 | Admitting: Psychiatry

## 2011-09-14 ENCOUNTER — Ambulatory Visit (INDEPENDENT_AMBULATORY_CARE_PROVIDER_SITE_OTHER): Payer: 59 | Admitting: Psychiatry

## 2011-09-14 VITALS — Wt 360.0 lb

## 2011-09-14 DIAGNOSIS — F329 Major depressive disorder, single episode, unspecified: Secondary | ICD-10-CM

## 2011-09-14 MED ORDER — ARIPIPRAZOLE 15 MG PO TABS: 15.0000 mg | ORAL_TABLET | Freq: Every day | ORAL | Status: DC

## 2011-09-14 MED ORDER — BUPROPION HCL ER (XL) 300 MG PO TB24: 300.0000 mg | ORAL_TABLET | ORAL | Status: DC

## 2011-09-14 NOTE — Progress Notes (Signed)
[facility] [address] Progress Note  Greg Kemp [id number] 50 y.o.  09/14/2011 10:37 AM  Chief Complaint: I'm feeling more depressed anxious and I have no energy  History of Present Illness: Patient came for his followup appointment with his wife. He has been noticed more depressed anxious with decreased energy and anhedonia. On his last visit he was offered to do intensive outpatient program however patient declined and refused as his wife was also not feeling well. Patient complained of some crying spells and no motivation to do anything. On last visit we have tried Abilify which he has been taking 10 mg but does not feel much improved. Wife also endorse that he continues to have confusion likes take when he talked to himself with poor concentration and attention. Patient admitted sometime he hear his grand mother voices that she is calling him. He denies any active or passive suicidal thinking but remains isolated withdrawn and stays most of the time on his bed. He has gained weight.  He is taking Abilify 10 mg, Wellbutrin XL 300 mg and Cymbalta 60 mg daily. He has restarted taking Ambien as he could not sleep without it. He is also taking Xanax from his primary care physician. He has complete blood work done last week but we are still waiting for his blood results. He has been out of work for past few months and currently not able to resume his work and thinking about long-term disability.  Suicidal Ideation: No Plan Formed: No Patient has means to carry out plan: No  Homicidal Ideation: No Plan Formed: No Patient has means to carry out plan: No  Review of Systems: Psychiatric: Agitation: No Hallucination: Yes Depressed Mood: Yes Insomnia: Yes Hypersomnia: No Altered Concentration: No Feels Worthless: No Grandiose Ideas: No Belief In Special Powers: No New/Increased Substance Abuse: No Compulsions: No  Neurologic: Headache: Yes Seizure: No Paresthesias: No  Past  Medical Family, Social History: Patient lives with his wife who has been very supportive. He continues to think about his daughter who was involved with the criminal who is in jail now.  Outpatient Encounter Prescriptions as of 09/14/2011  Medication Sig Dispense Refill  . ARIPiprazole (ABILIFY) 15 MG tablet Take 1 tablet (15 mg total) by mouth daily.  30 tablet  0  . DULoxetine (CYMBALTA) 60 MG capsule Take 60 mg by mouth daily.        Marland Kitchen DISCONTD: ARIPiprazole (ABILIFY) 10 MG tablet Take 10 mg by mouth daily.        Marland Kitchen ALPRAZolam (XANAX) 1 MG tablet Take 1 mg by mouth at bedtime as needed.        Marland Kitchen buPROPion (WELLBUTRIN XL) 300 MG 24 hr tablet Take 1 tablet (300 mg total) by mouth every morning.  30 tablet  0  . esomeprazole (NEXIUM) 40 MG capsule Take 40 mg by mouth daily before breakfast.        . levothyroxine (SYNTHROID, LEVOTHROID) 200 MCG tablet Take 200 mcg by mouth daily.        . Multiple Vitamin (MULTIVITAMIN) tablet Take 1 tablet by mouth daily.        Marland Kitchen pyridOXINE (VITAMIN B-6) 100 MG tablet Take 100 mg by mouth daily.        Marland Kitchen zolpidem (AMBIEN) 10 MG tablet Take 10 mg by mouth at bedtime as needed.        Marland Kitchen DISCONTD: buPROPion (WELLBUTRIN SR) 150 MG 12 hr tablet Take 150 mg by mouth 2 (two) times daily.  Past Psychiatric History/Hospitalization(s): Anxiety: Yes Bipolar Disorder: No Depression: Yes Mania: No Psychosis: Yes Schizophrenia: No Personality Disorder: No Hospitalization for psychiatric illness: Yes History of Electroconvulsive Shock Therapy: No Prior Suicide Attempts: No  Physical Exam: Constitutional:  Wt 360 lb (163.295 kg)  General Appearance: alert, oriented, no acute distress  Musculoskeletal: Strength & Muscle Tone: within normal limits Gait & Station: normal Patient leans: N/A  Psychiatric: Speech (describe rate, volume, coherence, spontaneity, and abnormalities if any): Speech is slow soft and nonspontaneous with decreased volume and  rate  Thought Process (describe rate, content, abstract reasoning, and computation): His thought process is slow but logical linear and goal-directed  Associations: Coherent and Relevant  Thoughts: He has poverty of thought content with occasional hallucination of his grandmother but there are no command auditory hallucination  Mental Status: Orientation: oriented to person, place and time/date Mood & Affect: depressed affect and flat affect Attention Span & Concentration: Poor  Medical Decision Making (Choose Three): Review of Psycho-Social Stressors (1), Established Problem, Worsening (2), Review of Last Therapy Session (1), Review of Medication Regimen & Side Effects (2) and Review of New Medication or Change in Dosage (2)  Assessment: Axis I: Maj. depressive disorder with psychotic features  Axis II: Deferred  Axis III: Hypothyroidism obesity GERD  Axis IV: Moderate  Axis V: 45-55   Plan: I talked to the patient and his wife in length about increasing his Abilify to 15 mg. At this time patient reported no side effects of medication however he continued to exhibit significant depression anhedonia. I recommended to consider one more time for intensive outpatient program. We are still waiting for his labs which was done at primary care physician office. We will consider adding a stimulant on his next visit if he continued to endorse significant fatigue and decreased energy. We'll also consider option of electroconvulsive therapy his medication continues to show no improvement. I also talk about risks and benefits of medication in detail. We also talked about safety plan that in case if she started to feel worsening of his depression or any time having active suicidal thinking than he to call 911 or go to local ER. At this time patient cannot work and thinking about long-term disability. I will see him again in one month. Time spent 30 minutes   Amely Voorheis T.,  MD 09/14/2011

## 2011-09-18 ENCOUNTER — Ambulatory Visit (HOSPITAL_COMMUNITY): Payer: 59 | Admitting: Psychiatry

## 2011-09-18 ENCOUNTER — Other Ambulatory Visit (HOSPITAL_COMMUNITY): Payer: Self-pay | Admitting: Psychiatry

## 2011-09-21 ENCOUNTER — Ambulatory Visit (INDEPENDENT_AMBULATORY_CARE_PROVIDER_SITE_OTHER): Payer: 59 | Admitting: Psychiatry

## 2011-09-21 ENCOUNTER — Encounter (HOSPITAL_COMMUNITY): Payer: Self-pay | Admitting: Psychiatry

## 2011-09-21 DIAGNOSIS — F332 Major depressive disorder, recurrent severe without psychotic features: Secondary | ICD-10-CM

## 2011-09-21 NOTE — Patient Instructions (Signed)
Continue efforts regarding self-care, chart moods
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
## 2011-09-24 ENCOUNTER — Ambulatory Visit: Payer: 59

## 2011-09-27 ENCOUNTER — Encounter: Payer: Self-pay | Admitting: Cardiology

## 2011-10-04 ENCOUNTER — Encounter (HOSPITAL_COMMUNITY): Payer: Self-pay | Admitting: Psychiatry

## 2011-10-04 ENCOUNTER — Ambulatory Visit (INDEPENDENT_AMBULATORY_CARE_PROVIDER_SITE_OTHER): Payer: 59 | Admitting: Psychiatry

## 2011-10-04 DIAGNOSIS — F332 Major depressive disorder, recurrent severe without psychotic features: Secondary | ICD-10-CM

## 2011-10-04 NOTE — Patient Instructions (Signed)
Use coping statement handout
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
## 2011-10-18 ENCOUNTER — Ambulatory Visit (INDEPENDENT_AMBULATORY_CARE_PROVIDER_SITE_OTHER): Payer: 59 | Admitting: Psychiatry

## 2011-10-18 ENCOUNTER — Encounter (HOSPITAL_COMMUNITY): Payer: Self-pay | Admitting: Psychiatry

## 2011-10-18 DIAGNOSIS — F332 Major depressive disorder, recurrent severe without psychotic features: Secondary | ICD-10-CM
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
## 2011-10-18 NOTE — Patient Instructions (Signed)
Improve self-care, use coping statements, chart moods

## 2011-10-24 ENCOUNTER — Encounter (HOSPITAL_COMMUNITY): Payer: Self-pay | Admitting: Psychiatry

## 2011-10-24 ENCOUNTER — Ambulatory Visit (INDEPENDENT_AMBULATORY_CARE_PROVIDER_SITE_OTHER): Payer: 59 | Admitting: Psychiatry

## 2011-10-24 VITALS — Wt 366.0 lb

## 2011-10-24 DIAGNOSIS — F329 Major depressive disorder, single episode, unspecified: Secondary | ICD-10-CM

## 2011-10-24 MED ORDER — CLONAZEPAM 1 MG PO TABS: ORAL_TABLET | ORAL | Status: DC

## 2011-10-24 MED ORDER — ARIPIPRAZOLE 10 MG PO TABS: 10.0000 mg | ORAL_TABLET | Freq: Every day | ORAL | Status: DC

## 2011-10-24 MED ORDER — BUPROPION HCL ER (XL) 300 MG PO TB24: 300.0000 mg | ORAL_TABLET | ORAL | Status: DC

## 2011-10-24 NOTE — Progress Notes (Signed)
  [facility] [address] Progress Note  Greg Kemp [id number] 51 y.o.  10/24/2011 3:08 PM  Patient came for his followup appointment with his wife. On his last visit we have increased his Abilify as patient reported increased depression and anxiety. However patient do not seem much improvement and actually complain of tremors and shakes. Patient recently had sleep study done and we don't have any results for that study. Per patient's wife he did not have any sleep apnea and did not prescribe any medication her CPAP machine. He continued to endorse insomnia anxiety and nervousness at the nighttime. Despite taking Ambien and Xanax he still sleep for a few hours. He had quite Christmas. He continued to endorse decreased energy and no motivation to do things. Though he denies any active or passive suicidal thinking but endorse social isolation and anhedonia and at times hopeless feeling and crying spells. He has not joined a gym and has gained 5 more pounds. Patient is concerned about her financial burden and now actively looking for her COBRA insurance to cover his have dispense. He is compliant with his Cymbalta and Wellbutrin.    Family and Social History:  Patient lives with his wife who has been very supportive. He continues to think about his daughter who was involved with the criminal who is in jail now.  Constitutional:  Wt 366 lb (166.017 kg)  Mental status examination Patient is morbid obese. He is casually dressed and fairly groomed. His thought process is slow and his speech is soft with low tone and volume. His thought process is also slow but logical linear and goal-directed. His attention and concentration is distracted. He described his mood is depressed and anxious. His affect is constricted. He complained of tired feeling. He denies any active or passive suicidal thoughts or homicidal thoughts. There no psychotic symptoms present. He's alert and oriented x3. His insight  judgment and impulse control is okay.  Assessment: Axis I: Maj. depressive disorder with psychotic features  Axis II: Deferred  Axis III: Hypothyroidism obesity GERD  Axis IV: Moderate  Axis V: 45-55   Plan:  I do believe increasing Abilify did not help him and we will like to cut down his Abilify to 10 mg to help reducing the side effects like tremors and shakes he at I have suggested him to stop Xanax and Ambien since they are not helping to sleep and nervousness. I recommended to try Klonopin 0.5 mg at bedtime and also take day 0.5 mg for anxiety. I have explained risks and benefits of medication. I also talk about tolerance and dependency issues with the benzodiazepine. We are still waiting for his blood work and sleep study done in December. If patient continues to show little improvement I will consider adding stimulant or ECT treatment. I recommended to call us if he has a question or concern about the medication otherwise I will see him in one month.

## 2011-11-02 ENCOUNTER — Encounter (HOSPITAL_COMMUNITY): Payer: Self-pay | Admitting: Psychiatry

## 2011-11-02 ENCOUNTER — Ambulatory Visit (INDEPENDENT_AMBULATORY_CARE_PROVIDER_SITE_OTHER): Payer: 59 | Admitting: Psychiatry

## 2011-11-02 DIAGNOSIS — F332 Major depressive disorder, recurrent severe without psychotic features: Secondary | ICD-10-CM

## 2011-11-02 NOTE — Patient Instructions (Signed)
Chart moods using 10 point scale, use mindfulness exercise using breath awareness,  Continue to improve self-care
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
## 2011-11-16 ENCOUNTER — Ambulatory Visit (INDEPENDENT_AMBULATORY_CARE_PROVIDER_SITE_OTHER): Payer: 59 | Admitting: Psychiatry

## 2011-11-16 ENCOUNTER — Encounter (HOSPITAL_COMMUNITY): Payer: Self-pay | Admitting: Psychiatry

## 2011-11-16 DIAGNOSIS — F332 Major depressive disorder, recurrent severe without psychotic features: Secondary | ICD-10-CM
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
## 2011-11-16 NOTE — Patient Instructions (Signed)
Continue to chart moods, identify and challenge thought patterns, continue to improve self-care

## 2011-11-23 ENCOUNTER — Ambulatory Visit (HOSPITAL_COMMUNITY): Payer: 59 | Admitting: Psychiatry

## 2011-11-28 ENCOUNTER — Ambulatory Visit (INDEPENDENT_AMBULATORY_CARE_PROVIDER_SITE_OTHER): Payer: 59 | Admitting: Psychiatry

## 2011-11-28 ENCOUNTER — Encounter (HOSPITAL_COMMUNITY): Payer: Self-pay | Admitting: Psychiatry

## 2011-11-28 VITALS — Wt 366.0 lb

## 2011-11-28 DIAGNOSIS — F329 Major depressive disorder, single episode, unspecified: Secondary | ICD-10-CM

## 2011-11-28 MED ORDER — BUPROPION HCL ER (XL) 300 MG PO TB24: 300.0000 mg | ORAL_TABLET | ORAL | Status: DC

## 2011-11-28 MED ORDER — ARIPIPRAZOLE 10 MG PO TABS: 10.0000 mg | ORAL_TABLET | Freq: Every day | ORAL | Status: DC

## 2011-11-28 MED ORDER — BENZTROPINE MESYLATE 0.5 MG PO TABS: 0.5000 mg | ORAL_TABLET | Freq: Every day | ORAL | Status: DC

## 2011-11-28 MED ORDER — CLONAZEPAM 1 MG PO TABS: ORAL_TABLET | ORAL | Status: DC

## 2011-11-28 NOTE — Progress Notes (Signed)
  [facility] [address] Progress Note  Greg Kemp [id number] 51 y.o.  11/28/2011 8:49 AM  Chief complaint I like Klonopin better than Xanax  History of present illness Patient is 51 year old Caucasian unemployed married man who came for his followup appointment with his wife. On last visit we have stopped Xanax and Ambien and his started him on Klonopin for sleep and anxiety. Patient liked Klonopin. He is less anxious and more sleep. However he continues to have depressive thoughts and social isolation. He stays most of the time in his room and does not interact with other people. He does not like people around him. He continues to have crying spells and hopeless feeling. His daughter got married in January and he is happy about her. His other daughter is also getting married in March. Wife endorse patient does not leave the house despite excessive encouragement. Though patient denies any active or passive suicidal thinking but admitted decreased interest in his daily life. Patient decided to keep [facility] insurance even though it is expensive. He mentioned he has no other choice. He continues to have shakes however they're less intense and less frequent since Abilify has reduced. He has no other side effects of medication. He denies any agitation anger or mood swings.   Past psychiatric history Patient has at least one psychiatric admission in 2010. He has history of suicidal thinking but denies any history of suicidal attempt. He has been in intensive outpatient program 2 times due to significant depression and decreased coping skills. He has no history of violent or aggressive behavior.  Medical history Patient has history of tonsillectomy, appendectomy, obesity, GERD and hypothyroidism.   Family and Social History:  Patient lives with his wife who has been very supportive. He continues to think about his daughter who was involved with the criminal who is in jail now.  Alcohol and  substance use history Patient denies any history of illegal substance use however he drank alcohol on occasion. Denies any binge drinking  Constitutional:  Wt 366 lb (166.017 kg)  Mental status examination Patient is morbid obese. He is casually dressed and fairly groomed. His thought process is slow and his speech is soft with low tone and volume. He maintained fair eye contact. His attention and concentration is fair. He described his mood anxious and depressed and his affect is constricted. He has poverty of thought content. Denies any active or passive suicidal thinking and homicidal thinking. He denies any auditory or visual hallucination. There no psychotic symptoms present. He is alert and oriented x3. His insight judgment and impulse control is okay  Assessment: Axis I: Maj. depressive disorder with psychotic features  Axis II: Deferred  Axis III: Hypothyroidism obesity GERD  Axis IV: Moderate  Axis V: 45-55   Plan:  I review his blood test, he is still has low thyroid. I suggested him to contact his primary care physician if his thyroid medicine needs to be increased. I will also add Cogentin 0.5 mg at bedtime for shakes which is possibly coming from Abilify. I will continue Cymbalta Wellbutrin and Klonopin as prescribed. Patient wants to do intensive outpatient program and I will refer him to assessment for evaluation. I have explained risks and benefits of medication in detail. She will continue to see therapist in this office for individual counseling. I will see him again in 6 weeks. Samples of Cymbalta is given. Time spent 30

## 2011-11-30 ENCOUNTER — Ambulatory Visit (HOSPITAL_COMMUNITY): Payer: 59 | Admitting: Psychiatry

## 2011-11-30 ENCOUNTER — Ambulatory Visit (HOSPITAL_COMMUNITY)
Admission: RE | Admit: 2011-11-30 | Discharge: 2011-11-30 | Disposition: A | Discharge status: Home / Self Care | Payer: 59 | Attending: Psychiatry | Admitting: Psychiatry

## 2011-11-30 DIAGNOSIS — F3289 Other specified depressive episodes: Secondary | ICD-10-CM | POA: Insufficient documentation

## 2011-11-30 DIAGNOSIS — K219 Gastro-esophageal reflux disease without esophagitis: Secondary | ICD-10-CM | POA: Insufficient documentation

## 2011-11-30 DIAGNOSIS — F329 Major depressive disorder, single episode, unspecified: Secondary | ICD-10-CM | POA: Insufficient documentation

## 2011-11-30 DIAGNOSIS — Z79899 Other long term (current) drug therapy: Secondary | ICD-10-CM | POA: Insufficient documentation

## 2011-11-30 DIAGNOSIS — E079 Disorder of thyroid, unspecified: Secondary | ICD-10-CM | POA: Insufficient documentation
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
## 2011-12-04 ENCOUNTER — Other Ambulatory Visit (HOSPITAL_COMMUNITY): Payer: 59 | Attending: Psychiatry

## 2011-12-04 ENCOUNTER — Encounter (HOSPITAL_COMMUNITY): Payer: Self-pay

## 2011-12-04 DIAGNOSIS — Z79899 Other long term (current) drug therapy: Secondary | ICD-10-CM | POA: Insufficient documentation

## 2011-12-04 DIAGNOSIS — F410 Panic disorder [episodic paroxysmal anxiety] without agoraphobia: Secondary | ICD-10-CM | POA: Insufficient documentation

## 2011-12-04 DIAGNOSIS — Z818 Family history of other mental and behavioral disorders: Secondary | ICD-10-CM | POA: Insufficient documentation

## 2011-12-04 DIAGNOSIS — F331 Major depressive disorder, recurrent, moderate: Secondary | ICD-10-CM

## 2011-12-04 DIAGNOSIS — F332 Major depressive disorder, recurrent severe without psychotic features: Secondary | ICD-10-CM | POA: Insufficient documentation

## 2011-12-04 DIAGNOSIS — K219 Gastro-esophageal reflux disease without esophagitis: Secondary | ICD-10-CM | POA: Insufficient documentation

## 2011-12-04 DIAGNOSIS — E079 Disorder of thyroid, unspecified: Secondary | ICD-10-CM | POA: Insufficient documentation

## 2011-12-04 DIAGNOSIS — Z6379 Other stressful life events affecting family and household: Secondary | ICD-10-CM | POA: Insufficient documentation

## 2011-12-04 NOTE — Progress Notes (Signed)
    Daily Group Progress Note  Program: IOP  Group Time: 9:00-10:30 am   Participation Level: Active  Behavioral Response: Appropriate  Type of Therapy:  Process Group  Summary of Progress: Today was patients first day in the group. He reports severe depression and described feeling a need to return to the IOP program for a second time to help manage depression symptoms. Patients was introduced to the group and observed the group process.      Group Time: 10:30 am - 12:00 pm   Participation Level:  Active  Behavioral Response: Appropriate  Type of Therapy: Psycho-education Group  Summary of Progress: Patient received information on effective communication and how to use better skills of communication in personal relationships.  Maxcine Ham, MSW, LCSW

## 2011-12-04 NOTE — Progress Notes (Unsigned)
Patient ID: Greg Kemp, male   DOB: 29-Oct-1960, 51 y.o.   MRN: [id number] D:  This is a 14 mwm referred per Dr. Lolly Mustache, treatment for depressive symptoms along with panic attacks.  C/O tremors in hands.  This patient is well known to this writer due to previous admit in [facility] (April 2012).  Reports after IOP, he attempted to return to work, but couldn't due to severe panic attacks.  Triggers/Stressors:  1) Unresolved Grief/Loss Issues:  Lost job (~ 2 months ago) due to taking multiple leaves.  Also, continues to grieve loss of daughter due to cancer.  2) Awaiting to hear about wife's long term disability.  3) Finances:  Has gone through life's savings.  States he paid for son's wedding.  4) Living Situation:  Pregnant daughter, along with her husband resides with patient.  Pt is currently remodeling to accommodate a nursery. *CC: previous record for further history Pt denies any drugs/ETOH.  Reports wife of thirty five years is supportive.  A:  Re-oriented patient.  Informed Dr. Lolly Mustache and Florencia Reasons, LCSW of admit.  Provided patient with orientation folder.  R:  Pt receptive.

## 2011-12-05 ENCOUNTER — Other Ambulatory Visit (HOSPITAL_COMMUNITY): Payer: Self-pay | Admitting: Psychiatry

## 2011-12-05 ENCOUNTER — Other Ambulatory Visit (HOSPITAL_COMMUNITY): Payer: 59

## 2011-12-05 DIAGNOSIS — F329 Major depressive disorder, single episode, unspecified: Secondary | ICD-10-CM

## 2011-12-05 NOTE — Progress Notes (Signed)
    Daily Group Progress Note  Program: IOP  Group Time: 9:00-10:30 am   Participation Level: Active  Behavioral Response: Appropriate  Type of Therapy:  Process Group  Summary of Progress: Patient reports severe depression associated with the grieving of the loss of his daughter. Patient identified his difficulty with overcoming feelings of loss despite the death occuring two years ago. Patient states he goes back and forth between anger and sadness and remember the sad times and the happy times.      Group Time: 9:00-10:30 am   Participation Level:  Active  Behavioral Response: Appropriate  Type of Therapy: Psycho-education Group  Summary of Progress: Patient participated in learning the DBT skill of mindfulness as an emotional management tool and how to live more actively in the present moment.   Maxcine Ham, MSW, LCSW

## 2011-12-06 ENCOUNTER — Other Ambulatory Visit (HOSPITAL_COMMUNITY): Payer: 59

## 2011-12-06 NOTE — Progress Notes (Signed)
    Daily Group Progress Note  Program: IOP  Group Time: 9:00-10:30 am   Participation Level: Active  Behavioral Response: Appropriate  Type of Therapy:  Process Group  Summary of Progress: Patient continues to report depressed mood and grieving the loss of his daughter. He also described how he feels his depression returned from stopping his routine of daily self care and ensuring he is doing his daily list of activities to manage mood. He identified the importance of resuming these activities.      Group Time: 10:30 am - 12:00 pm   Participation Level:  Active  Behavioral Response: Appropriate  Type of Therapy: Psycho-education Group  Summary of Progress: Patient learned tools of  healthy Boundary setting and ways to incorporate into their life to manage depression and anxiety.   Maxcine Ham, MSW, LCSW
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
## 2011-12-07 ENCOUNTER — Other Ambulatory Visit (HOSPITAL_COMMUNITY): Payer: 59

## 2011-12-07 NOTE — Progress Notes (Signed)
    Daily Group Progress Note  Program: IOP  Group Time: 9:00-10:30 am   Participation Level: Active  Behavioral Response: Appropriate  Type of Therapy:  Process Group  Summary of Progress: Patient presents with depressed mood and flat affect. Patient described feeling a sense of trauma and loss over remember the suicide of two friends and how this still haunts him today from finding both of tehri bodies. Patient is processes different losses and how they are impacting his depression.      Group Time: 10:30 am - 12:00 pm   Participation Level:  Active  Behavioral Response: Appropriate  Type of Therapy: Psycho-education Group  Summary of Progress: Patient participated in further discussion on boundaries and barriers affecting setting them.    Maxcine Ham, MSW, LCSW

## 2011-12-08 ENCOUNTER — Other Ambulatory Visit (HOSPITAL_COMMUNITY): Payer: 59 | Attending: Psychiatry

## 2011-12-08 DIAGNOSIS — Z6379 Other stressful life events affecting family and household: Secondary | ICD-10-CM | POA: Insufficient documentation

## 2011-12-08 DIAGNOSIS — Z79899 Other long term (current) drug therapy: Secondary | ICD-10-CM | POA: Insufficient documentation

## 2011-12-08 DIAGNOSIS — E079 Disorder of thyroid, unspecified: Secondary | ICD-10-CM | POA: Insufficient documentation

## 2011-12-08 DIAGNOSIS — K219 Gastro-esophageal reflux disease without esophagitis: Secondary | ICD-10-CM | POA: Insufficient documentation

## 2011-12-08 DIAGNOSIS — F410 Panic disorder [episodic paroxysmal anxiety] without agoraphobia: Secondary | ICD-10-CM | POA: Insufficient documentation

## 2011-12-08 DIAGNOSIS — F332 Major depressive disorder, recurrent severe without psychotic features: Secondary | ICD-10-CM | POA: Insufficient documentation

## 2011-12-08 DIAGNOSIS — Z818 Family history of other mental and behavioral disorders: Secondary | ICD-10-CM | POA: Insufficient documentation

## 2011-12-11 ENCOUNTER — Other Ambulatory Visit (HOSPITAL_COMMUNITY): Payer: 59

## 2011-12-11 DIAGNOSIS — F329 Major depressive disorder, single episode, unspecified: Secondary | ICD-10-CM

## 2011-12-11 DIAGNOSIS — F332 Major depressive disorder, recurrent severe without psychotic features: Secondary | ICD-10-CM

## 2011-12-11 MED ORDER — CLONAZEPAM 1 MG PO TABS: 2.0000 mg | ORAL_TABLET | Freq: Every day | ORAL | Status: DC

## 2011-12-11 MED ORDER — ARIPIPRAZOLE 10 MG PO TABS: 10.0000 mg | ORAL_TABLET | Freq: Two times a day (BID) | ORAL | Status: DC
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
## 2011-12-12 ENCOUNTER — Other Ambulatory Visit (HOSPITAL_COMMUNITY): Payer: 59

## 2011-12-12 NOTE — Progress Notes (Signed)
    Daily Group Progress Note  Program: IOP  Group Time: 9:00-10:30 am   Participation Level: Active  Behavioral Response: Appropriate  Type of Therapy:  Process Group  Summary of Progress: Patient continues to report depression, but has improved affect and is smiling and interacting more with the group. He described his current financial stress in that he is living solely off from his retirement and that is almost gone. Patient described paying for his daughters upcoming wedding despite not having the money. Members challenged patient on not setting healthier boundaries for himself but despite the awareness of how this is causing him increased stress he wants to take care of his children.      Group Time: 10:30 am - 12:00 pm   Participation Level:  Active  Behavioral Response: Appropriate  Type of Therapy: Psycho-education Group  Summary of Progress: Patient was introduced to progressive muscle relaxation as a way to decrease stress and promote healthy sleep. Patient practiced the technique and identified how they could use it going forward.  Maxcine Ham, MSW, LCSW

## 2011-12-13 ENCOUNTER — Other Ambulatory Visit (HOSPITAL_COMMUNITY): Payer: 59

## 2011-12-13 NOTE — Progress Notes (Signed)
    Daily Group Progress Note  Program: IOP  Group Time: 9:00-10:30 am   Participation Level: Active  Behavioral Response: Appropriate  Type of Therapy:  Process Group  Summary of Progress: Patient identified depression triggers and ways to better manage depressive symptoms.          Group Time: 10:30 am - 12:00 pm   Participation Level:  Active  Behavioral Response: Appropriate  Type of Therapy: Process Group  Summary of Progress: Patient continued discussion on depression triggers and ways to better manage depressive symptoms.     Ivan Maskell, MSW, LCSW  

## 2011-12-13 NOTE — Progress Notes (Signed)
    Daily Group Progress Note  Program: IOP  Group Time: 9:00-10:30 am   Participation Level: Active  Behavioral Response: Appropriate  Type of Therapy:  Process Group  Summary of Progress: Patient reports continued depression over death of his daughter and is working through his process of grieving. Identifies his difficulty with setting boundaries with his children, but is not willing to make changes in this area despite it causing financial stress and anxiety. Patient identifies a need for continued self-care and is identifying ways to increase this on a daily basis     Group Time: 10:30 am - 12:00 pm   Participation Level:  Active  Behavioral Response: Appropriate  Type of Therapy: Process Group  Summary of Progress: Patient participated in a grief and loss group to work on identifying and accepting personal losses.   Maxcine Ham, MSW, LCSW

## 2011-12-14 ENCOUNTER — Other Ambulatory Visit (HOSPITAL_COMMUNITY): Payer: 59

## 2011-12-15 ENCOUNTER — Other Ambulatory Visit (HOSPITAL_COMMUNITY): Payer: 59

## 2011-12-15 NOTE — Progress Notes (Signed)
    Daily Group Progress Note  Program: IOP  Group Time: 9:00-10:30 am    Participation Level: Active  Behavioral Response: Appropriate  Type of Therapy:  Process Group  Summary of Progress: Patient reports continued depression and anxiety, but states group is an outlet for him to express feelings and have some time for himself to release.      Group Time: 10:30 am - 12:00 pm   Participation Level:  Active  Behavioral Response: Appropriate  Type of Therapy: Psycho-education Group  Summary of Progress: Patient discussed depression symptoms and barriers to wellness.   Maxcine Ham, MSW, LCSW

## 2011-12-15 NOTE — Progress Notes (Signed)
    Daily Group Progress Note  Program: IOP  Group Time: 9:00-10:30 am   Participation Level: Active  Behavioral Response: Appropriate  Type of Therapy:  Process Group  Summary of Progress: Patient reports high depression and anxiety stemming from learning yesterday that his daughters ultrasound came back with "abnormalities" on it. Patient described fearing something wrong with the baby and how this is contributing to stress. Patient received support from others and was encouraged to find ways to take care of himself during this difficult time, which is a struggle for patient as his tendency is to care for others before himself.      Group Time: 10:30 am - 12:00 pm   Participation Level:  Active  Behavioral Response: Appropriate  Type of Therapy: Psycho-education Group  Summary of Progress: Patient defined the term "crisis" and discussed healthy and unhealthy ways of managing during crisis times.   Maxcine Ham, MSW, LCSW

## 2011-12-18 ENCOUNTER — Telehealth (HOSPITAL_COMMUNITY): Payer: Self-pay | Admitting: Psychiatry

## 2011-12-18 ENCOUNTER — Other Ambulatory Visit (HOSPITAL_COMMUNITY): Payer: 59

## 2011-12-19 ENCOUNTER — Other Ambulatory Visit (HOSPITAL_COMMUNITY): Payer: 59

## 2011-12-19 NOTE — Progress Notes (Signed)
    Daily Group Progress Note  Program: IOP  Group Time: 9:00-10:30 am  Participation Level: Active  Behavioral Response: Appropriate  Type of Therapy:  Process Group  Summary of Progress: Patient described being too depressed to attend group yesterday. He appeared tired with flat affect today. He stated he cried all day yesterday and could not get out of the bed. He continues to give all his energy towards planning his daughter wedding and states he did that over the weekend to the point it drained him. He has insight into how his caring for others is negativley impacting his wellness, but is not ready to make changes in this area.      Group Time: 10:30 am - 12:00 pm   Participation Level:  Active  Behavioral Response: Appropriate  Type of Therapy: Psycho-education Group  Summary of Progress: Patient participated in a discussion and education segment on taking personal responsibility for mental health wellness and the importance of doing things daily to take control over ensuring overall good mental health.  Maxcine Ham, MSW, LCSW

## 2011-12-20 ENCOUNTER — Encounter (HOSPITAL_COMMUNITY): Payer: Self-pay

## 2011-12-20 ENCOUNTER — Other Ambulatory Visit (HOSPITAL_COMMUNITY): Payer: 59

## 2011-12-20 NOTE — Progress Notes (Signed)
    Daily Group Progress Note  Program: IOP  Group Time: 9:00-10:30 am   Participation Level: Active  Behavioral Response: Appropriate  Type of Therapy:  Process Group  Summary of Progress: Patient participated in a discussion on depression symptoms and triggers and identified personal triggers impacting overall mental health wellness.        Group Time: 10:30 am - 12:00 pm   Participation Level:  Active  Behavioral Response: Appropriate  Type of Therapy: Psycho-education Group  Summary of Progress: Patient participated in an educational activity in identifying and creating a daily maintenance list of activities to do daily to maintain wellness and discussed the importance of planning to ensure wellness.   Modean Mccullum, MSW, LCSW  

## 2011-12-21 ENCOUNTER — Other Ambulatory Visit (HOSPITAL_COMMUNITY): Payer: 59

## 2011-12-21 ENCOUNTER — Telehealth (HOSPITAL_COMMUNITY): Payer: Self-pay | Admitting: Psychiatry

## 2011-12-22 ENCOUNTER — Telehealth (HOSPITAL_COMMUNITY): Payer: Self-pay | Admitting: Psychiatry

## 2011-12-22 ENCOUNTER — Other Ambulatory Visit (HOSPITAL_COMMUNITY): Payer: 59

## 2011-12-25 ENCOUNTER — Telehealth (HOSPITAL_COMMUNITY): Payer: Self-pay | Admitting: Psychiatry

## 2011-12-25 ENCOUNTER — Other Ambulatory Visit (HOSPITAL_COMMUNITY): Payer: 59
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
## 2011-12-25 NOTE — Patient Instructions (Signed)
Patient will be discharged today.  Will follow up with Dr. Lolly Mustache & Florencia Reasons, LCSW as scheduled.  Encouraged support groups.

## 2011-12-26 ENCOUNTER — Other Ambulatory Visit (HOSPITAL_COMMUNITY): Payer: 59
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
## 2011-12-27 ENCOUNTER — Other Ambulatory Visit (HOSPITAL_COMMUNITY): Payer: 59

## 2011-12-28 ENCOUNTER — Other Ambulatory Visit (HOSPITAL_COMMUNITY): Payer: 59

## 2011-12-29 ENCOUNTER — Other Ambulatory Visit (HOSPITAL_COMMUNITY): Payer: 59

## 2012-01-01 ENCOUNTER — Other Ambulatory Visit (HOSPITAL_COMMUNITY): Payer: 59

## 2012-01-09 ENCOUNTER — Encounter (HOSPITAL_COMMUNITY): Payer: Self-pay | Admitting: Psychiatry

## 2012-01-09 ENCOUNTER — Ambulatory Visit (INDEPENDENT_AMBULATORY_CARE_PROVIDER_SITE_OTHER): Payer: 59 | Admitting: Psychiatry

## 2012-01-09 VITALS — Wt 362.0 lb

## 2012-01-09 DIAGNOSIS — F329 Major depressive disorder, single episode, unspecified: Secondary | ICD-10-CM
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
## 2012-01-17 ENCOUNTER — Encounter (HOSPITAL_COMMUNITY): Payer: Self-pay | Admitting: Psychiatry

## 2012-01-17 ENCOUNTER — Ambulatory Visit (INDEPENDENT_AMBULATORY_CARE_PROVIDER_SITE_OTHER): Payer: 59 | Admitting: Psychiatry

## 2012-01-17 DIAGNOSIS — F331 Major depressive disorder, recurrent, moderate: Secondary | ICD-10-CM
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
## 2012-01-17 NOTE — Patient Instructions (Signed)
Improve self-care - exercise, eating habits, work on setting boundaries and using assertiveness skills

## 2012-01-31 ENCOUNTER — Ambulatory Visit (INDEPENDENT_AMBULATORY_CARE_PROVIDER_SITE_OTHER): Payer: 59 | Admitting: Psychiatry

## 2012-01-31 DIAGNOSIS — F331 Major depressive disorder, recurrent, moderate: Secondary | ICD-10-CM
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
## 2012-01-31 NOTE — Patient Instructions (Signed)
Discussed orally 

## 2012-02-08 ENCOUNTER — Ambulatory Visit (INDEPENDENT_AMBULATORY_CARE_PROVIDER_SITE_OTHER): Payer: 59 | Admitting: Psychiatry

## 2012-02-08 ENCOUNTER — Encounter (HOSPITAL_COMMUNITY): Payer: Self-pay | Admitting: Psychiatry

## 2012-02-08 VITALS — Wt 365.0 lb

## 2012-02-08 DIAGNOSIS — F329 Major depressive disorder, single episode, unspecified: Secondary | ICD-10-CM

## 2012-02-08 MED ORDER — ARIPIPRAZOLE 10 MG PO TABS: 10.0000 mg | ORAL_TABLET | Freq: Two times a day (BID) | ORAL | Status: DC

## 2012-02-08 MED ORDER — CLONAZEPAM 2 MG PO TABS: 2.0000 mg | ORAL_TABLET | Freq: Every day | ORAL | Status: DC

## 2012-02-08 MED ORDER — BENZTROPINE MESYLATE 0.5 MG PO TABS: 0.5000 mg | ORAL_TABLET | Freq: Two times a day (BID) | ORAL | Status: DC

## 2012-02-08 MED ORDER — BUPROPION HCL ER (XL) 300 MG PO TB24: 300.0000 mg | ORAL_TABLET | ORAL | Status: DC

## 2012-02-08 NOTE — Progress Notes (Signed)
  Chief complaint Medication management and followup    History of present illness Patient is 51 year old Caucasian unemployed married man who came for his followup appointment with his wife.  Patient is doing much better on his medication.  He started to do some outdoor activities.  He is more involved in house project.  Last week and he played golf with his son when he visits him on his place.  He is very excited as his daughter is pregnant and he is involve in the house project to extend his house .  He has some shakes but overall he denies any side effects of medication.  He denies any agitation anger or paranoia however he has some residual crying spells.  He is more involved in his diet and monitoring his calorie intake and doing regular exercise.  His wife also endorse much improvement on his current medication.  He denies any active or passive suicidal thinking.  Current psychiatric medication Abilify 10 mg twice a day Klonopin 2 mg at bedtime Cogentin 0.5 mg daily Cymbalta 60 mg daily Wellbutrin XL 300 mg daily  Past psychiatric history Patient has at least one psychiatric admission in 2010. He has history of suicidal thinking but denies any history of suicidal attempt. He has been in intensive outpatient program 3 times due to significant depression and decreased coping skills. He has no history of violent or aggressive behavior.  Medical history Patient has history of tonsillectomy, appendectomy, obesity, GERD and hypothyroidism.   Family and Social History:  Patient lives with his wife who has been very supportive. He continues to think about his daughter who was involved with the criminal who is in jail now.  Alcohol and substance use history Patient denies any history of illegal substance use however he drank alcohol on occasion. Denies any binge drinking  Mental status examination Patient is morbid obese. He is casually dressed and fairly groomed. His thought process is slow  and his speech is soft with low tone and volume. He maintained fair eye contact. His attention and concentration is fair. He described his mood neutral and his affect is mood congruent.  He still has poverty of thought content but he denies any active or passive suicidal thinking and homicidal thinking. He denies any auditory or visual hallucination. There no psychotic symptoms present. He is alert and oriented x3. His insight judgment and impulse control is okay  Assessment: Axis I: Maj. depressive disorder with psychotic features Axis II: Deferred Axis III: Hypothyroidism obesity GERD Axis IV: Moderate Axis V: 45-55   Plan:  I recommended to try Cogentin 0.5 mg twice a day to target those residual tremors .  I will continue his Klonopin Cymbalta Wellbutrin and Abilify at present does.  He will see therapist regularly for coping and social skills.  I recommend to call us if he has a question or concern about the medication or if he feel worsening of the symptoms.  I continued to encourage him for regular exercise and monitor his calorie intake.  I will see him again in 8 weeks.

## 2012-02-14 ENCOUNTER — Ambulatory Visit (INDEPENDENT_AMBULATORY_CARE_PROVIDER_SITE_OTHER): Payer: 59 | Admitting: Psychiatry

## 2012-02-14 DIAGNOSIS — F332 Major depressive disorder, recurrent severe without psychotic features: Secondary | ICD-10-CM

## 2012-02-14 DIAGNOSIS — F331 Major depressive disorder, recurrent, moderate: Secondary | ICD-10-CM

## 2012-02-14 NOTE — Patient Instructions (Signed)
Discussed orally 
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
## 2012-02-22 ENCOUNTER — Other Ambulatory Visit (HOSPITAL_COMMUNITY): Payer: Self-pay | Admitting: Psychiatry

## 2012-03-06 ENCOUNTER — Ambulatory Visit (INDEPENDENT_AMBULATORY_CARE_PROVIDER_SITE_OTHER): Payer: 59 | Admitting: Psychiatry

## 2012-03-06 DIAGNOSIS — F331 Major depressive disorder, recurrent, moderate: Secondary | ICD-10-CM
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
## 2012-03-06 NOTE — Patient Instructions (Signed)
Use notes for organization, structure, ...Marland KitchenMarland KitchenMarland Kitchenmedication, eating patterns, exercise

## 2012-03-20 ENCOUNTER — Other Ambulatory Visit (HOSPITAL_COMMUNITY): Payer: Self-pay | Admitting: Psychiatry

## 2012-03-20 ENCOUNTER — Telehealth (HOSPITAL_COMMUNITY): Payer: Self-pay | Admitting: *Deleted

## 2012-03-20 ENCOUNTER — Ambulatory Visit (HOSPITAL_COMMUNITY): Payer: Self-pay | Admitting: Psychiatry

## 2012-03-20 DIAGNOSIS — F329 Major depressive disorder, single episode, unspecified: Secondary | ICD-10-CM

## 2012-03-20 MED ORDER — DULOXETINE HCL 60 MG PO CPEP: 60.0000 mg | ORAL_CAPSULE | Freq: Every day | ORAL | Status: DC

## 2012-03-27 ENCOUNTER — Ambulatory Visit (INDEPENDENT_AMBULATORY_CARE_PROVIDER_SITE_OTHER): Payer: 59 | Admitting: Psychiatry

## 2012-03-27 ENCOUNTER — Encounter (HOSPITAL_COMMUNITY): Payer: Self-pay | Admitting: Psychiatry

## 2012-03-27 DIAGNOSIS — F331 Major depressive disorder, recurrent, moderate: Secondary | ICD-10-CM

## 2012-03-27 NOTE — Patient Instructions (Signed)
Discussed orally 
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
## 2012-04-09 ENCOUNTER — Ambulatory Visit (HOSPITAL_COMMUNITY): Payer: Self-pay | Admitting: Psychiatry

## 2012-04-17 ENCOUNTER — Ambulatory Visit (INDEPENDENT_AMBULATORY_CARE_PROVIDER_SITE_OTHER): Payer: 59 | Admitting: Psychiatry

## 2012-04-17 ENCOUNTER — Encounter (HOSPITAL_COMMUNITY): Payer: Self-pay | Admitting: Psychiatry

## 2012-04-17 DIAGNOSIS — F331 Major depressive disorder, recurrent, moderate: Secondary | ICD-10-CM

## 2012-04-17 NOTE — Patient Instructions (Signed)
Discussed orally 
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
## 2012-04-18 ENCOUNTER — Ambulatory Visit (INDEPENDENT_AMBULATORY_CARE_PROVIDER_SITE_OTHER): Payer: 59 | Admitting: Psychiatry

## 2012-04-18 ENCOUNTER — Encounter (HOSPITAL_COMMUNITY): Payer: Self-pay | Admitting: Psychiatry

## 2012-04-18 VITALS — Wt 380.0 lb

## 2012-04-18 DIAGNOSIS — F323 Major depressive disorder, single episode, severe with psychotic features: Secondary | ICD-10-CM

## 2012-04-18 DIAGNOSIS — F329 Major depressive disorder, single episode, unspecified: Secondary | ICD-10-CM

## 2012-04-18 MED ORDER — BUPROPION HCL ER (XL) 300 MG PO TB24: 300.0000 mg | ORAL_TABLET | ORAL | Status: DC

## 2012-04-18 MED ORDER — DULOXETINE HCL 60 MG PO CPEP: 60.0000 mg | ORAL_CAPSULE | Freq: Every day | ORAL | Status: DC

## 2012-04-18 MED ORDER — CLONAZEPAM 2 MG PO TABS: 2.0000 mg | ORAL_TABLET | Freq: Every day | ORAL | Status: DC

## 2012-04-18 MED ORDER — BENZTROPINE MESYLATE 0.5 MG PO TABS: 0.5000 mg | ORAL_TABLET | Freq: Two times a day (BID) | ORAL | Status: DC

## 2012-04-18 MED ORDER — LAMOTRIGINE 25 MG PO TABS: ORAL_TABLET | ORAL | Status: DC

## 2012-04-18 NOTE — Progress Notes (Signed)
Chief complaint I'm feeling more depressed .      History of present illness Patient is 51 year old Caucasian unemployed married man who came for his followup appointment with his wife.  Patient started to feel more depressed and anxious.  He is not sure if excessive rain and cloudy weather for past 2 weeks has cause more depression.  He admitted decrease isolation decreased energy and limited involvement in his daily activities.  In the past she was doing better and able to play golf into your activities but recently he is very seclusive and withdrawn.  He admitted crying spells and very emotional.  He denies any active or passive suicidal thoughts however feel hopeless sometimes.  He is taking Cogentin 0.5 mg twice a day .  He denies any side effects of medication.  He still has some tremors however they are not intense.  He denies any agitation anger or paranoia.  He is not drinking or using any illegal substance.  He admitted that he is not doing exercise and gain weight from the past.  He is scheduled to see his primary care physician next week.  Current psychiatric medication Abilify 10 mg twice a day Klonopin 2 mg at bedtime Cogentin 0.5 mg daily Cymbalta 60 mg daily Wellbutrin XL 300 mg daily  Past psychiatric history Patient has at least one psychiatric admission in 2010. He has history of suicidal thinking but denies any history of suicidal attempt. He has been in intensive outpatient program 3 times due to significant depression and decreased coping skills. He has no history of violent or aggressive behavior.  Medical history Patient has history of tonsillectomy, appendectomy, obesity, GERD and hypothyroidism.   Family and Social History:  Patient lives with his wife who has been very supportive. He continues to think about his daughter who was involved with the criminal who is in jail now.  Alcohol and substance use history Patient denies any history of illegal substance use  however he drank alcohol on occasion. Denies any binge drinking  Mental status examination Patient is morbid obese. He is casually dressed and fairly groomed. His thought process is slow and his speech is soft with low tone and volume. He maintained fair eye contact. His attention and concentration is fair. He described his mood depressed and anxious and his affect is mood congruent.  He still has poverty of thought content but he denies any active or passive suicidal thinking and homicidal thinking. He denies any auditory or visual hallucination. There no psychotic symptoms present. He is alert and oriented x3. His insight judgment and impulse control is okay  Assessment: Axis I: Maj. depressive disorder with psychotic features Axis II: Deferred Axis III: Hypothyroidism obesity GERD Axis IV: Moderate Axis V: 45-55   Plan:  Patient is taking moderate dose of Wellbutrin, Abilify and Cymbalta.  He is also taking Klonopin 2 mg at bedtime.  He has some tremors which could be due to Abilify.  I recommend to try Lamictal which he has never tried before.  I explained the risks and benefits of medication including rash in that case he need to stop the medication immediately.  If patient shows good response with Lamictal we will consider lowering Abilify.  I also encouraged him to do regular exercise and watch his calorie intake since patient has gained weight from the last visit.  I encourage him to see therapist regularly for coping skills.  I will see him again in 4 weeks.  Safety plan discussed that  in case if she have any suicidal thoughts or homicidal thoughts and he need to call 911 or go to local emergency room.  Portion of this note is generated with voice dictation software and may contain typographical error.

## 2012-05-01 ENCOUNTER — Ambulatory Visit (INDEPENDENT_AMBULATORY_CARE_PROVIDER_SITE_OTHER): Payer: 59 | Admitting: Psychiatry

## 2012-05-01 DIAGNOSIS — F331 Major depressive disorder, recurrent, moderate: Secondary | ICD-10-CM

## 2012-05-01 NOTE — Patient Instructions (Signed)
Discussed orally 
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
## 2012-05-09 ENCOUNTER — Encounter (HOSPITAL_COMMUNITY): Payer: Self-pay | Admitting: Psychiatry

## 2012-05-09 ENCOUNTER — Ambulatory Visit (INDEPENDENT_AMBULATORY_CARE_PROVIDER_SITE_OTHER): Payer: 59 | Admitting: Psychiatry

## 2012-05-09 DIAGNOSIS — F323 Major depressive disorder, single episode, severe with psychotic features: Secondary | ICD-10-CM

## 2012-05-09 DIAGNOSIS — F329 Major depressive disorder, single episode, unspecified: Secondary | ICD-10-CM

## 2012-05-09 MED ORDER — LAMOTRIGINE 100 MG PO TABS: 100.0000 mg | ORAL_TABLET | Freq: Every day | ORAL | Status: DC

## 2012-05-09 NOTE — Progress Notes (Signed)
Chief complaint I'm taking Lamictal he feel some improvement.        History of present illness Patient is 51 year old Caucasian unemployed married man who came for his followup appointment with his wife.  On his last visit we started him on Lamictal 25 mg with gradual increase to 50 mg.  Patient denies any side effects including any rash or itching.  He is feeling some improvement in his mood.  However he continued to feel most of the time sad isolated and withdrawn.  He has granddaughter 2 weeks ago and he is excited about this news.  His son lives next door.  Patient denies any agitation anger mood swing.  He is compliant with his other psychiatric medication.  He admitted most of his social isolation and decreased energy due to weather.  He has been not able to go out due to brain and cloudy weather which causes more pain in his body.  He has seen Dr. hall who is thinking to try a new medication for his pain.  Patient make consider taking this medication.  Overall patient denies any agitation anger mood swing.  He denies any paranoia or any hallucination.  We're still waiting for his blood work which was done by his primary care physician.  is scheduled to see his primary care physician next week.  Current psychiatric medication Abilify 10 mg twice a day Klonopin 2 mg at bedtime Cogentin 0.5 mg daily Cymbalta 60 mg daily Wellbutrin XL 300 mg daily Lamictal 50 mg daily which will be increased to 100 mg daily.  Past psychiatric history Patient has at least one psychiatric admission in 2010. He has history of suicidal thinking but denies any history of suicidal attempt. He has been in intensive outpatient program 3 times due to significant depression and decreased coping skills. He has no history of violent or aggressive behavior.  Medical history Patient has history of tonsillectomy, appendectomy, obesity, GERD and hypothyroidism.  His primary care physician is Dr. Margo Aye.   Family and Social  History:  Patient lives with his wife who has been very supportive. He continues to think about his daughter who was involved with the criminal who is in jail now.  Alcohol and substance use history Patient denies any history of illegal substance use however he drank alcohol on occasion. Denies any binge drinking  Mental status examination Patient is morbid obese. He is casually dressed and fairly groomed. His thought process is slow and his speech is soft with low tone and volume. He maintained fair eye contact. His attention and concentration is fair. He described his mood neutral and his affect is mood congruent.  He still has poverty of thought content but he denies any active or passive suicidal thinking and homicidal thinking. He denies any auditory or visual hallucination. There no psychotic symptoms present. He is alert and oriented x3. His insight judgment and impulse control is okay  Assessment: Axis I: Maj. depressive disorder with psychotic features Axis II: Deferred Axis III: Hypothyroidism obesity GERD Axis IV: Moderate Axis V: 45-55   Plan:  Patient is taking moderate dose of Wellbutrin, Klonopin , Abilify and Cymbalta.  He still has some tremors which are due to Abilify however he does not want to decrease his Abilify.  I recommend to increase his Lamictal to 100 mg daily.  I recommend to watch carefully for his rash in that case he need to stop the medication immediately.  One more time I encouraged him to contact his  primary care physician to have his recent blood work sent to [place].  I recommend to call us if he is a question or concern about the medication if he feel worsening of the symptoms.  I will see him again in 4 weeks.  Portion of this note is generated with voice dictation software and may contain typographical error.

## 2012-05-15 ENCOUNTER — Ambulatory Visit (HOSPITAL_COMMUNITY): Payer: Self-pay | Admitting: Psychiatry

## 2012-05-20 ENCOUNTER — Ambulatory Visit (HOSPITAL_COMMUNITY): Payer: Self-pay | Admitting: Psychiatry

## 2012-05-23 ENCOUNTER — Ambulatory Visit (INDEPENDENT_AMBULATORY_CARE_PROVIDER_SITE_OTHER): Payer: 59 | Admitting: Psychiatry

## 2012-05-23 DIAGNOSIS — F331 Major depressive disorder, recurrent, moderate: Secondary | ICD-10-CM

## 2012-05-23 NOTE — Patient Instructions (Addendum)
Discussed orally 
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
## 2012-05-28 ENCOUNTER — Other Ambulatory Visit (HOSPITAL_COMMUNITY): Payer: Self-pay | Admitting: Psychiatry

## 2012-05-28 DIAGNOSIS — F329 Major depressive disorder, single episode, unspecified: Secondary | ICD-10-CM

## 2012-06-07 ENCOUNTER — Ambulatory Visit (HOSPITAL_COMMUNITY): Payer: Self-pay | Admitting: Psychiatry

## 2012-06-11 ENCOUNTER — Ambulatory Visit (HOSPITAL_COMMUNITY): Payer: Self-pay | Admitting: Psychiatry

## 2012-06-13 ENCOUNTER — Ambulatory Visit (INDEPENDENT_AMBULATORY_CARE_PROVIDER_SITE_OTHER): Payer: 59 | Admitting: Psychiatry

## 2012-06-13 ENCOUNTER — Encounter (HOSPITAL_COMMUNITY): Payer: Self-pay | Admitting: Psychiatry

## 2012-06-13 VITALS — Wt 396.0 lb

## 2012-06-13 DIAGNOSIS — F329 Major depressive disorder, single episode, unspecified: Secondary | ICD-10-CM

## 2012-06-13 DIAGNOSIS — F323 Major depressive disorder, single episode, severe with psychotic features: Secondary | ICD-10-CM

## 2012-06-13 MED ORDER — LAMOTRIGINE 150 MG PO TABS: 150.0000 mg | ORAL_TABLET | Freq: Every day | ORAL | Status: DC

## 2012-06-13 MED ORDER — BENZTROPINE MESYLATE 0.5 MG PO TABS: 0.5000 mg | ORAL_TABLET | Freq: Two times a day (BID) | ORAL | Status: DC

## 2012-06-13 MED ORDER — BUPROPION HCL ER (XL) 300 MG PO TB24: 300.0000 mg | ORAL_TABLET | ORAL | Status: DC

## 2012-06-13 MED ORDER — ARIPIPRAZOLE 15 MG PO TABS: ORAL_TABLET | ORAL | Status: DC

## 2012-06-13 MED ORDER — CLONAZEPAM 2 MG PO TABS
2.0000 mg | ORAL_TABLET | Freq: Every day | ORAL | Status: DC
Start: 2012-06-13 — End: 2012-08-13
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
## 2012-06-17 ENCOUNTER — Ambulatory Visit (INDEPENDENT_AMBULATORY_CARE_PROVIDER_SITE_OTHER): Payer: 59 | Admitting: Psychiatry

## 2012-06-17 DIAGNOSIS — F331 Major depressive disorder, recurrent, moderate: Secondary | ICD-10-CM

## 2012-06-17 NOTE — Patient Instructions (Signed)
Discussed orally 
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
## 2012-06-26 ENCOUNTER — Other Ambulatory Visit (HOSPITAL_COMMUNITY): Payer: Self-pay | Admitting: Psychiatry

## 2012-07-05 ENCOUNTER — Ambulatory Visit (HOSPITAL_COMMUNITY): Payer: Self-pay | Admitting: Psychiatry

## 2012-07-19 ENCOUNTER — Ambulatory Visit (INDEPENDENT_AMBULATORY_CARE_PROVIDER_SITE_OTHER): Payer: 59 | Admitting: Psychiatry

## 2012-07-19 DIAGNOSIS — F329 Major depressive disorder, single episode, unspecified: Secondary | ICD-10-CM

## 2012-07-19 DIAGNOSIS — F331 Major depressive disorder, recurrent, moderate: Secondary | ICD-10-CM

## 2012-07-19 NOTE — Patient Instructions (Signed)
Discussed orally 
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
## 2012-08-09 ENCOUNTER — Ambulatory Visit (HOSPITAL_COMMUNITY): Payer: Self-pay | Admitting: Psychiatry

## 2012-08-13 ENCOUNTER — Encounter (HOSPITAL_COMMUNITY): Payer: Self-pay | Admitting: Psychiatry

## 2012-08-13 ENCOUNTER — Telehealth (HOSPITAL_COMMUNITY): Payer: Self-pay | Admitting: *Deleted

## 2012-08-13 ENCOUNTER — Ambulatory Visit (INDEPENDENT_AMBULATORY_CARE_PROVIDER_SITE_OTHER): Payer: Self-pay | Admitting: Psychiatry

## 2012-08-13 VITALS — HR 64 | Ht 70.5 in | Wt 391.0 lb

## 2012-08-13 DIAGNOSIS — F5105 Insomnia due to other mental disorder: Secondary | ICD-10-CM | POA: Insufficient documentation

## 2012-08-13 DIAGNOSIS — F319 Bipolar disorder, unspecified: Secondary | ICD-10-CM

## 2012-08-13 DIAGNOSIS — F419 Anxiety disorder, unspecified: Secondary | ICD-10-CM | POA: Insufficient documentation

## 2012-08-13 DIAGNOSIS — F329 Major depressive disorder, single episode, unspecified: Secondary | ICD-10-CM

## 2012-08-13 DIAGNOSIS — F323 Major depressive disorder, single episode, severe with psychotic features: Secondary | ICD-10-CM

## 2012-08-13 DIAGNOSIS — R52 Pain, unspecified: Secondary | ICD-10-CM | POA: Insufficient documentation

## 2012-08-13 MED ORDER — DULOXETINE HCL 60 MG PO CPEP: 60.0000 mg | ORAL_CAPSULE | Freq: Every day | ORAL | Status: DC

## 2012-08-13 MED ORDER — AMITRIPTYLINE HCL 25 MG PO TABS: 25.0000 mg | ORAL_TABLET | Freq: Every day | ORAL | Status: DC

## 2012-08-13 MED ORDER — CARBAMAZEPINE 200 MG PO TABS: 200.0000 mg | ORAL_TABLET | Freq: Three times a day (TID) | ORAL | Status: DC

## 2012-08-13 MED ORDER — BUPROPION HCL ER (XL) 450 MG PO TB24: 450.0000 mg | ORAL_TABLET | ORAL | Status: DC

## 2012-08-13 MED ORDER — GABAPENTIN 100 MG PO CAPS: ORAL_CAPSULE | ORAL | Status: DC

## 2012-08-13 NOTE — Patient Instructions (Addendum)
Getting a spiritual home will be very important for you.  Consider [facility] as an option.  It is our job to love ourselves and to take care of ourselves.  Work on the additional room 1 hour every day that it is above freezing. 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   Tegretol for mood, anxiety, and depression  Neurontin for anxiety and pain, may make "drunk" at first  Elavil For insomnia and pain and depression and anxiety, but may stimulate appetite (FOCUS on protein)

## 2012-08-13 NOTE — Progress Notes (Addendum)
  Chief complaint I've not been doing as well the last 2 weeks.  I like coming here.  I hear that I am supposed to take care of myself.      History of present illness Patient is 51 year old Caucasian unemployed married man who came for his followup appointment with his wife.  Spoke with them at length about getting his spiritual being in order.  Also spoke with him about gradually increasing his exercise and switching his medications to better help his memory, anxiety, pain, and depression.  He is doing well on the Cymbalta,  His insurance has lapsed and he can no longer afford the Abilify.  Will switch to Tegretol for mood, anxiety, and recovery of his brain effect from the klonopin.  Will also add Neurontin for the pain and anxiety control.  Will add Mobic for his pain management.   Current psychiatric medication Abilify 10 mg is too expensive Klonopin 2 mg stop because of MEMORY deficits noted by the wife. Cogentin 0.5 mg no longer needed Cymbalta 60 mg daily Wellbutrin XL 300 mg daily increase to 450 mg Lamictal 100 stop due to ineffective  Past psychiatric history Patient has at least one psychiatric admission in 2010. He has history of suicidal thinking but denies any history of suicidal attempt. He has been in intensive outpatient program 3 times due to significant depression and decreased coping skills. He has no history of violent or aggressive behavior.  Medical history Patient has history of tonsillectomy, appendectomy, obesity, GERD and hypothyroidism.  His primary care physician is Dr. Margo Aye.   Family and Social History:  Patient lives with his wife who has been very supportive.  He has to son into daughter.  One daughter lives in [place], other lives close by.  One son lives close by and who lives in [place] close to [place].  Alcohol and substance use history Patient denies any history of illegal substance use however he drink alcohol on occasion. Denies any  binge drinking  Mental status examination Patient is morbid obese. He is casually dressed and fairly groomed. His thought process is slow and his speech is soft with low tone and volume. He maintained fair eye contact. His attention and concentration is fair. He described his mood neutral and his affect is mood congruent.  He still has poverty of thought content but he denies any active or passive suicidal thinking and homicidal thinking. He denies any auditory or visual hallucination. There no psychotic symptoms present. He is alert and oriented x3. His insight judgment and impulse control is okay  Assessment: Axis I: Maj. depressive disorder with psychotic features Axis II: Deferred Axis III: Hypothyroidism obesity GERD Axis IV: Moderate Axis V: 45-55   Plan:  I reviewed spiritual notions related to his physical and emotional issues.  Encouraged he and his wife to find a spiritual home before Thanksgiving.  Spoke with them about his depression, anxiety, insomnia, and pain management.  They agreed to a switch to Tegretol for mood and anxiety and Neurontin for anxiety and pain management.  Also increase Wellbutrin for better energy in the AM.  Stop Klonopin for better memory.  Explained that we could go up on the Cymbalta, but will see how the frits changes do for him.  RTC 4 weeks. Spent 45 minutes.  Will also take Prilosec and add Celebrex from home supply.  Samples of Cymbalta 60 mg given.

## 2012-08-29 ENCOUNTER — Other Ambulatory Visit (HOSPITAL_COMMUNITY): Payer: Self-pay | Admitting: Psychiatry

## 2012-09-03 ENCOUNTER — Other Ambulatory Visit (HOSPITAL_COMMUNITY): Payer: Self-pay | Admitting: Psychiatry

## 2012-09-13 ENCOUNTER — Ambulatory Visit (HOSPITAL_COMMUNITY): Payer: Self-pay | Admitting: Psychiatry

## 2012-09-13 ENCOUNTER — Telehealth (HOSPITAL_COMMUNITY): Payer: Self-pay | Admitting: Psychiatry

## 2012-09-13 NOTE — Telephone Encounter (Signed)
Samples for 1 months given

## 2012-09-15 ENCOUNTER — Other Ambulatory Visit (HOSPITAL_COMMUNITY): Payer: Self-pay | Admitting: Psychiatry

## 2012-09-20 ENCOUNTER — Other Ambulatory Visit (HOSPITAL_COMMUNITY): Payer: Self-pay | Admitting: Psychiatry

## 2012-09-22 NOTE — Telephone Encounter (Signed)
Please contact Dr Dan Humphreys, I am no longer his provider.

## 2012-10-17 ENCOUNTER — Encounter (HOSPITAL_COMMUNITY): Payer: Self-pay | Admitting: Psychiatry

## 2012-10-17 ENCOUNTER — Ambulatory Visit (INDEPENDENT_AMBULATORY_CARE_PROVIDER_SITE_OTHER): Payer: Self-pay | Admitting: Psychiatry

## 2012-10-17 ENCOUNTER — Telehealth (HOSPITAL_COMMUNITY): Payer: Self-pay | Admitting: Psychiatry

## 2012-10-17 VITALS — Wt 394.2 lb

## 2012-10-17 DIAGNOSIS — F323 Major depressive disorder, single episode, severe with psychotic features: Secondary | ICD-10-CM

## 2012-10-17 DIAGNOSIS — F329 Major depressive disorder, single episode, unspecified: Secondary | ICD-10-CM

## 2012-10-17 DIAGNOSIS — F319 Bipolar disorder, unspecified: Secondary | ICD-10-CM

## 2012-10-17 DIAGNOSIS — R52 Pain, unspecified: Secondary | ICD-10-CM

## 2012-10-17 DIAGNOSIS — F5105 Insomnia due to other mental disorder: Secondary | ICD-10-CM

## 2012-10-17 DIAGNOSIS — F419 Anxiety disorder, unspecified: Secondary | ICD-10-CM

## 2012-10-17 MED ORDER — DULOXETINE HCL 60 MG PO CPEP: 60.0000 mg | ORAL_CAPSULE | Freq: Every day | ORAL | Status: DC

## 2012-10-17 MED ORDER — ARIPIPRAZOLE 10 MG PO TABS: 10.0000 mg | ORAL_TABLET | Freq: Every day | ORAL | Status: DC

## 2012-10-17 MED ORDER — CARBAMAZEPINE 200 MG PO TABS: ORAL_TABLET | ORAL | Status: DC

## 2012-10-17 MED ORDER — BUPROPION HCL ER (XL) 450 MG PO TB24: 450.0000 mg | ORAL_TABLET | ORAL | Status: DC

## 2012-10-17 MED ORDER — GABAPENTIN 300 MG PO CAPS: ORAL_CAPSULE | ORAL | Status: DC

## 2012-10-17 MED ORDER — PROPRANOLOL HCL 10 MG PO TABS: 10.0000 mg | ORAL_TABLET | Freq: Three times a day (TID) | ORAL | Status: DC

## 2012-10-17 NOTE — Telephone Encounter (Signed)
Faxed instructions.

## 2012-10-17 NOTE — Patient Instructions (Signed)
Taper off Tegretol and start Abilify  Inderal can help with the tremor with that.  Neurontin has been shifted to 30 mg caps.  May need to go into the hospital if these changes don't work.

## 2012-10-17 NOTE — Progress Notes (Signed)
Greg Kemp MRN: [id number] DOB: [birth year] Age: 52 y.o.  Date: 10/17/2012 Start Time: 1:14 PM  Chief complaint Chief Complaint  Patient presents with  . Follow-up  . Medication Refill  . Depression  . Other    insomnia   Subjective: "I've been angry all the time".  History of present illness Patient is 52 year old Caucasian unemployed married man who came for his followup appointment with his daughter/  Spoke with wife by phone while she was in ICU with her mother.  The best report I can piece together is that he chooses to sleep during the day and miss family gathering such as Thanksgiving and Christmas and stay up mostly at night.  Since stopping the Xanax, his memory is better.  His shaking is considerably better off the Abilify.  The anger is substantial and must be addressed.  I feel that I should go back to Abilify, but add Inderal for the shakes.  Will have the family apply for assistance from the Pharmaceutical company.  Wife will be applying for [facility] insurance.    Current psychiatric medication Abilify 10 mg is too expensive, but will have family apply for drug company assistance. Cogentin 0.5 mg no longer needed Cymbalta 60 mg daily Wellbutrin XL 300 mg daily increase to 450 mg Lamictal 100 stop due to ineffective  Past psychiatric history Patient has at least one psychiatric admission in 2010. He has history of suicidal thinking but denies any history of suicidal attempt. He has been in intensive outpatient program 3 times due to significant depression and decreased coping skills. He has no history of violent or aggressive behavior.  Medical history Patient has history of tonsillectomy, appendectomy, obesity, GERD and hypothyroidism.  His primary care physician is Dr. Margo Aye.   Family and Social History:  Patient lives with his wife who has been very supportive.  He has to son into daughter.  One daughter lives in [place], other lives close by.  One son lives close by and  who lives in [place] close to [place].  Alcohol and substance use history Patient denies any history of illegal substance use however he drink alcohol on occasion. Denies any binge drinking  Mental status examination Patient is morbid obese. He is casually dressed and fairly groomed. His thought process is slow and his speech is soft with low tone and volume. He maintained fair eye contact. His attention and concentration is fair. He described his mood neutral and his affect is mood congruent.  He still has poverty of thought content but he denies any active or passive suicidal thinking and homicidal thinking. He denies any auditory or visual hallucination. There no psychotic symptoms present. He is alert and oriented x3. His insight judgment and impulse control is okay  Assessment: Axis I: Maj. depressive disorder with psychotic features Axis II: Deferred Axis III: Hypothyroidism obesity GERD Axis IV: Moderate Axis V: 45-55  Plan:  I took his vitals.  I reviewed CC, tobacco/med/surg Hx, meds effects/ side effects, problem list, therapies and responses as well as current situation/symptoms discussed options. See orders and pt instructions for more details.  Greg Kemp  End Time: 2:01 PM

## 2012-10-23 ENCOUNTER — Telehealth (HOSPITAL_COMMUNITY): Payer: Self-pay | Admitting: Psychiatry

## 2012-10-23 DIAGNOSIS — F329 Major depressive disorder, single episode, unspecified: Secondary | ICD-10-CM

## 2012-10-23 MED ORDER — BUPROPION HCL ER (XL) 450 MG PO TB24: 450.0000 mg | ORAL_TABLET | ORAL | Status: DC

## 2012-10-23 NOTE — Telephone Encounter (Signed)
Request satisfied via eScripts  

## 2012-11-12 ENCOUNTER — Telehealth (HOSPITAL_COMMUNITY): Payer: Self-pay | Admitting: Psychiatry

## 2012-11-14 NOTE — Telephone Encounter (Signed)
Phone message completed in the phone message section.  

## 2012-11-28 ENCOUNTER — Ambulatory Visit (INDEPENDENT_AMBULATORY_CARE_PROVIDER_SITE_OTHER): Payer: Self-pay | Admitting: Psychiatry

## 2012-11-28 ENCOUNTER — Encounter (HOSPITAL_COMMUNITY): Payer: Self-pay | Admitting: Psychiatry

## 2012-11-28 VITALS — Wt 392.0 lb

## 2012-11-28 DIAGNOSIS — F329 Major depressive disorder, single episode, unspecified: Secondary | ICD-10-CM

## 2012-11-28 MED ORDER — PROPRANOLOL HCL 10 MG PO TABS: 10.0000 mg | ORAL_TABLET | Freq: Three times a day (TID) | ORAL | Status: DC

## 2012-11-28 MED ORDER — AMITRIPTYLINE HCL 25 MG PO TABS: 25.0000 mg | ORAL_TABLET | Freq: Every day | ORAL | Status: DC

## 2012-11-28 MED ORDER — GABAPENTIN 300 MG PO CAPS: ORAL_CAPSULE | ORAL | Status: DC

## 2012-11-28 MED ORDER — DULOXETINE HCL 30 MG PO CPEP: 30.0000 mg | ORAL_CAPSULE | Freq: Every day | ORAL | Status: DC
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
## 2012-11-28 NOTE — Patient Instructions (Addendum)
"  Native Wisdom for [employer]" by Camelia Phenes could be very helpful.  Relaxation is the ultimate solution for you.  You can seek it through tub baths, bubble baths, essential oils or incense, walking or chatting with friends, listening to soft music, watching a candle burn and just letting all thoughts go and appreciating the true essence of the Creator.   Walking 8 minutes a day and increasing this gradually, BUT keeping a record and bringing that record in to the next visit would be very helpful.  Call if problems or concerns.

## 2012-12-28 ENCOUNTER — Other Ambulatory Visit (HOSPITAL_COMMUNITY): Payer: Self-pay | Admitting: Psychiatry

## 2012-12-30 NOTE — Telephone Encounter (Signed)
Rewrote for Wellbutrin for 24 days until appointment on Apr 17 by eScripts

## 2013-01-23 ENCOUNTER — Encounter (HOSPITAL_COMMUNITY): Payer: Self-pay | Admitting: Psychiatry

## 2013-01-23 ENCOUNTER — Ambulatory Visit (INDEPENDENT_AMBULATORY_CARE_PROVIDER_SITE_OTHER): Payer: BC Managed Care – PPO | Admitting: Psychiatry

## 2013-01-23 VITALS — Wt 390.8 lb

## 2013-01-23 DIAGNOSIS — F419 Anxiety disorder, unspecified: Secondary | ICD-10-CM

## 2013-01-23 DIAGNOSIS — F319 Bipolar disorder, unspecified: Secondary | ICD-10-CM

## 2013-01-23 DIAGNOSIS — F5105 Insomnia due to other mental disorder: Secondary | ICD-10-CM

## 2013-01-23 DIAGNOSIS — R52 Pain, unspecified: Secondary | ICD-10-CM

## 2013-01-23 DIAGNOSIS — R413 Other amnesia: Secondary | ICD-10-CM | POA: Insufficient documentation

## 2013-01-23 DIAGNOSIS — F323 Major depressive disorder, single episode, severe with psychotic features: Secondary | ICD-10-CM

## 2013-01-23 DIAGNOSIS — F329 Major depressive disorder, single episode, unspecified: Secondary | ICD-10-CM

## 2013-01-23 MED ORDER — GABAPENTIN 300 MG PO CAPS: ORAL_CAPSULE | ORAL | Status: DC

## 2013-01-23 MED ORDER — DULOXETINE HCL 30 MG PO CPEP: 30.0000 mg | ORAL_CAPSULE | Freq: Every day | ORAL | Status: DC

## 2013-01-23 MED ORDER — AMITRIPTYLINE HCL 25 MG PO TABS: 25.0000 mg | ORAL_TABLET | Freq: Every day | ORAL | Status: DC

## 2013-01-23 MED ORDER — PROPRANOLOL HCL 10 MG PO TABS: 10.0000 mg | ORAL_TABLET | Freq: Three times a day (TID) | ORAL | Status: DC

## 2013-01-23 MED ORDER — DONEPEZIL HCL 5 MG PO TABS: 5.0000 mg | ORAL_TABLET | Freq: Every day | ORAL | Status: DC

## 2013-01-23 MED ORDER — BUPROPION HCL ER (XL) 150 MG PO TB24: ORAL_TABLET | ORAL | Status: DC

## 2013-01-23 NOTE — Patient Instructions (Addendum)
Set a timer for 8 or a certain number minutes and walk for that amount of time in the house or in the yard.  Mark the number of minutes on a calendar for that day.  Do that every day this week.  Then next week increase the time by 1 minutes and then mark the calendar with the number of minutes for that day.  Each week increase your exercise by one minute.  Keep a record of this so you can see the progress you are making.  Do this every day, just like eating and sleeping.  It is good for pain control, depression, and for your soul/spirit.  Bring the record in for your next visit so we can talk about your effort and how you feel with the new exercise program going and working for you.  Yoga is a very helpful exercise method.  On TV, on line, or by DVD Adelfa Koh is a source of high quality information about yoga and videos on yoga.  Renee Ramus is the world's number one video yoga instructor according to some experts.  There are exceptional health benefits that can be achieved through yoga.  The main principles of yoga is acceptance, no competition, no comparison, and no judgement.  It is exceptional in helping people meditate and get to a very relaxed state.   "I am Wishes Fulfilled Meditation" by Marylene Buerger and Lyndal Pulley may be helpful MUSIC for getting to sleep or for meditating You can order it from on line.  You might find the Chill channel on Pandora and explore the artists that you like better.   Relaxation is the ultimate solution for you.  You can seek it through tub baths, bubble baths, essential oils or incense, walking or chatting with friends, listening to soft music, watching a candle burn and just letting all thoughts go and appreciating the true essence of the Creator.  Pets or animals may be very helpful.  You might spend some time with them and then go do more directed meditation.  Take care of yourself.  No one else is standing up to do the job and only you know what you need.   GET  SERIOUS about taking care of yourself.  Do the next right thing and that often means doing something to care for yourself along the lines of are you hungry, are you angry, are you lonely, are you tired, are you scared?  [employer] is what that stands for.  Call if problems or concerns.

## 2013-01-23 NOTE — Progress Notes (Addendum)
[facility] [address] Progress Note Greg Kemp MRN: [id number] DOB: 1960-12-25 Age: 52 y.o.  Date: 01/23/2013 Start Time: 2:25 PM End Time: 2:53 PM  Chief Complaint: Chief Complaint  Patient presents with  . Depression  . Follow-up  . Medication Refill   Subjective: "Depression and memory are the biggest problems.  I couldn't afford the Cymbalta.  I got insurance now". Depression 8/10 and Anxiety 10/10, where 1 is the best and 10 is the worst.   Pain is 8/10 with the Left leg.  Memory loss is problematic.  History of present illness Patient is 52 year old Caucasian unemployed married man who came for his followup appointment with his wife.  Pt had money issues and could not afford the Cymbalta.  The PCP thinks about trying meds for memory and ADHD.  I agree with Aricept, but not the ADHD meds.  Sleep seems better with the Elavil  Anger is better with the Neurontin.  Depression is worse off the Cymbalta.  Pain is too.  Brother in Social worker with CP moved in as the mother in law died.  Current psychiatric medication Cymbalta 30 mg NONE not able to afford Wellbutrin XL 450 mg daily Inderal 10 mg three times a day Neurontin 300 mg 4 times a day Elavil 25 mg at night Synthroid 200 mcg daily  Past psychiatric history Patient has at least one psychiatric admission in 2010. He has history of suicidal thinking but denies any history of suicidal attempt. He has been in intensive outpatient program 3 times due to significant depression and decreased coping skills. He has no history of violent or aggressive behavior.  Medical history Patient has history of tonsillectomy, appendectomy, obesity, GERD and hypothyroidism.  His primary care physician is Dr. Margo Aye.   Family and Social History:  Patient lives with his wife who has been very supportive.  He has to son into daughter.  One daughter lives in [place], other lives close by.  One son lives close by and who lives in [place] close to [place]. family history includes Alcohol abuse in his father and paternal uncle; Colon cancer in his father; Depression in his daughter; and Sexual abuse in his daughter.  There is no history of ADD / ADHD, and Anxiety disorder, and OCD, and Drug abuse, and Bipolar disorder, and Dementia, and Paranoid behavior, and Schizophrenia, and Seizures, and Physical abuse, .  Alcohol and substance use history Patient denies any history of illegal substance use however he drink alcohol on occasion. Denies any binge drinking  Mental status examination Patient is morbid obese. He is casually dressed and fairly groomed. His thought process is slow and his speech is soft with low tone and volume. He maintained fair eye contact. His attention and concentration is fair. He described his mood neutral and his affect is mood congruent.  He still has poverty of thought content but he denies any active or passive suicidal thinking and homicidal thinking. He denies any auditory or visual hallucination. There no psychotic symptoms present. He is alert and oriented x3. His insight judgment and impulse control is okay  Assessment: Axis I: Maj. depressive disorder with psychotic features Axis II: Deferred Axis III: Hypothyroidism obesity GERD Axis IV: Moderate Axis V: 45-55  Plan: I took his vitals.  I reviewed CC, tobacco/med/surg Hx, meds effects/ side effects, problem list, therapies and responses as well as current situation/symptoms discussed options. Restart effective meds, start Aricept for memory, cont' others. See orders and pt instructions for  more details.  MEDICATIONS this encounter: Meds ordered this encounter  Medications  . propranolol (INDERAL) 10 MG tablet    Sig: Take 1 tablet (10 mg total) by mouth 3 (three) times daily.    Dispense:  90 tablet    Refill:  2  . DISCONTD: gabapentin (NEURONTIN) 300 MG capsule    Sig: Take by mouth 3 or 4 times a day    Dispense:  120  capsule    Refill:  2  . DULoxetine (CYMBALTA) 30 MG capsule    Sig: Take 1-2 capsules (30-60 mg total) by mouth daily.    Dispense:  60 capsule    Refill:  1  . buPROPion (WELLBUTRIN XL) 150 MG 24 hr tablet    Sig: TAKE THREE TABLETS BY MOUTH EVERY DAY IN THE MORNING    Dispense:  90 tablet    Refill:  1    See pt on Apr 17th for appointment  . DISCONTD: amitriptyline (ELAVIL) 25 MG tablet    Sig: Take 1 tablet (25 mg total) by mouth at bedtime. For insomnia and pain and depression and anxiety    Dispense:  30 tablet    Refill:  1  . donepezil (ARICEPT) 5 MG tablet    Sig: Take 1 tablet (5 mg total) by mouth at bedtime.    Dispense:  30 tablet    Refill:  2  . gabapentin (NEURONTIN) 300 MG capsule    Sig: Take by mouth 3 or 4 times a day    Dispense:  120 capsule    Refill:  2  . amitriptyline (ELAVIL) 25 MG tablet    Sig: Take 1 tablet (25 mg total) by mouth at bedtime. For insomnia and pain and depression and anxiety    Dispense:  30 tablet    Refill:  1    Medical Decision Making Problem Points:  Established problem, worsening (2), New problem, with no additional work-up planned (3), Review of last therapy session (1) and Review of psycho-social stressors (1) Data Points:  Review or order clinical lab tests (1) Review of medication regiment & side effects (2) Review of new medications or change in dosage (2)  I certify that outpatient services furnished can reasonably be expected to improve the patient's condition.   Orson Aloe, MD, [facility]

## 2013-01-23 NOTE — Addendum Note (Signed)
Addended by: Mike Craze on: 01/23/2013 02:57 PM   Modules accepted: Orders

## 2013-01-24 ENCOUNTER — Ambulatory Visit (HOSPITAL_COMMUNITY): Payer: Self-pay | Admitting: Psychiatry

## 2013-02-20 ENCOUNTER — Ambulatory Visit (HOSPITAL_COMMUNITY): Payer: Self-pay | Admitting: Psychiatry

## 2013-02-26 ENCOUNTER — Ambulatory Visit (HOSPITAL_COMMUNITY): Payer: Self-pay | Admitting: Psychiatry

## 2013-03-12 ENCOUNTER — Ambulatory Visit (INDEPENDENT_AMBULATORY_CARE_PROVIDER_SITE_OTHER): Payer: BC Managed Care – PPO | Admitting: Psychiatry

## 2013-03-12 ENCOUNTER — Encounter (HOSPITAL_COMMUNITY): Payer: Self-pay | Admitting: Psychiatry

## 2013-03-12 VITALS — BP 128/87 | HR 68 | Ht 71.0 in | Wt 389.2 lb

## 2013-03-12 DIAGNOSIS — F323 Major depressive disorder, single episode, severe with psychotic features: Secondary | ICD-10-CM

## 2013-03-12 DIAGNOSIS — F319 Bipolar disorder, unspecified: Secondary | ICD-10-CM

## 2013-03-12 DIAGNOSIS — R413 Other amnesia: Secondary | ICD-10-CM

## 2013-03-12 DIAGNOSIS — F329 Major depressive disorder, single episode, unspecified: Secondary | ICD-10-CM

## 2013-03-12 DIAGNOSIS — F419 Anxiety disorder, unspecified: Secondary | ICD-10-CM

## 2013-03-12 DIAGNOSIS — F5105 Insomnia due to other mental disorder: Secondary | ICD-10-CM

## 2013-03-12 MED ORDER — BUPROPION HCL ER (XL) 150 MG PO TB24: ORAL_TABLET | ORAL | Status: DC

## 2013-03-12 MED ORDER — PROPRANOLOL HCL 10 MG PO TABS: 10.0000 mg | ORAL_TABLET | Freq: Three times a day (TID) | ORAL | Status: DC
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
## 2013-03-12 NOTE — Patient Instructions (Addendum)
Try the Aricept for memory and have Dr Margo Aye to consider continuing as well as ordering MRI  Consult Dr Kieth Brightly for testing for the memory.  Call if problems or concerns.  CUT BACK/CUT OUT on sugar and carbohydrates, that means very limited fruits and starchy vegetables and very limited grains, breads  The goal is low GLYCEMIC INDEX.  CUT OUT all wheat, rye, or barley for the GLUTEN in them.  HIGH fat and LOW carbohydrate diet is the KEY.  Eat avocados, eggs, lean meat like grass fed beef and chicken  Nuts and seeds would be good foods as well.   Stevia is an excellent sweetener.  Safe for the brain.   Lowella Grip is also a good safe sweetener, not the baking blend form of Truvia  Almond butter is awesome.  Check out all this on the Internet.  Dr Heber  is on the Internet with some good info about this.   http://www.drperlmutter.com is where that is.  An excellent site for info on this diet is http://paleoleap.com  [employer] makes dark chocolate that is sweetened with Stevia that is safe.  William Dalton is a soda sweetened with Stevia and is available at [employer] among other places.

## 2013-03-19 ENCOUNTER — Ambulatory Visit (INDEPENDENT_AMBULATORY_CARE_PROVIDER_SITE_OTHER): Payer: BC Managed Care – PPO | Admitting: Psychology

## 2013-04-03 ENCOUNTER — Ambulatory Visit (INDEPENDENT_AMBULATORY_CARE_PROVIDER_SITE_OTHER): Payer: BC Managed Care – PPO | Admitting: Psychology

## 2013-04-09 ENCOUNTER — Other Ambulatory Visit (HOSPITAL_COMMUNITY): Payer: Self-pay | Admitting: Psychiatry

## 2013-04-09 DIAGNOSIS — F319 Bipolar disorder, unspecified: Secondary | ICD-10-CM

## 2013-04-17 ENCOUNTER — Ambulatory Visit (INDEPENDENT_AMBULATORY_CARE_PROVIDER_SITE_OTHER): Payer: BC Managed Care – PPO | Admitting: Psychology

## 2013-04-17 DIAGNOSIS — F332 Major depressive disorder, recurrent severe without psychotic features: Secondary | ICD-10-CM

## 2013-04-17 DIAGNOSIS — F5105 Insomnia due to other mental disorder: Secondary | ICD-10-CM

## 2013-04-17 DIAGNOSIS — F489 Nonpsychotic mental disorder, unspecified: Secondary | ICD-10-CM

## 2013-04-17 DIAGNOSIS — F410 Panic disorder [episodic paroxysmal anxiety] without agoraphobia: Secondary | ICD-10-CM

## 2013-04-17 DIAGNOSIS — F4312 Post-traumatic stress disorder, chronic: Secondary | ICD-10-CM

## 2013-05-02 ENCOUNTER — Ambulatory Visit (INDEPENDENT_AMBULATORY_CARE_PROVIDER_SITE_OTHER): Payer: BC Managed Care – PPO | Admitting: Psychology

## 2013-05-02 DIAGNOSIS — F410 Panic disorder [episodic paroxysmal anxiety] without agoraphobia: Secondary | ICD-10-CM

## 2013-05-02 DIAGNOSIS — F489 Nonpsychotic mental disorder, unspecified: Secondary | ICD-10-CM

## 2013-05-02 DIAGNOSIS — F332 Major depressive disorder, recurrent severe without psychotic features: Secondary | ICD-10-CM

## 2013-05-02 DIAGNOSIS — F5105 Insomnia due to other mental disorder: Secondary | ICD-10-CM

## 2013-05-12 ENCOUNTER — Ambulatory Visit (HOSPITAL_COMMUNITY): Payer: Self-pay | Admitting: Psychiatry

## 2013-05-12 ENCOUNTER — Telehealth (HOSPITAL_COMMUNITY): Payer: Self-pay | Admitting: Psychiatry

## 2013-05-12 DIAGNOSIS — F319 Bipolar disorder, unspecified: Secondary | ICD-10-CM

## 2013-05-12 MED ORDER — DULOXETINE HCL 30 MG PO CPEP: ORAL_CAPSULE | ORAL | Status: DC

## 2013-05-12 NOTE — Telephone Encounter (Signed)
Medication refilled

## 2013-05-15 ENCOUNTER — Encounter (HOSPITAL_COMMUNITY): Payer: Self-pay | Admitting: Psychiatry

## 2013-05-15 ENCOUNTER — Ambulatory Visit (INDEPENDENT_AMBULATORY_CARE_PROVIDER_SITE_OTHER): Payer: BC Managed Care – PPO | Admitting: Psychiatry

## 2013-05-15 VITALS — Wt 379.0 lb

## 2013-05-15 DIAGNOSIS — F29 Unspecified psychosis not due to a substance or known physiological condition: Secondary | ICD-10-CM

## 2013-05-15 DIAGNOSIS — R413 Other amnesia: Secondary | ICD-10-CM

## 2013-05-15 DIAGNOSIS — F329 Major depressive disorder, single episode, unspecified: Secondary | ICD-10-CM

## 2013-05-15 DIAGNOSIS — F319 Bipolar disorder, unspecified: Secondary | ICD-10-CM

## 2013-05-15 DIAGNOSIS — F5105 Insomnia due to other mental disorder: Secondary | ICD-10-CM

## 2013-05-15 DIAGNOSIS — F419 Anxiety disorder, unspecified: Secondary | ICD-10-CM

## 2013-05-15 DIAGNOSIS — R52 Pain, unspecified: Secondary | ICD-10-CM

## 2013-05-15 MED ORDER — AMITRIPTYLINE HCL 25 MG PO TABS: 25.0000 mg | ORAL_TABLET | Freq: Every day | ORAL | Status: DC

## 2013-05-15 MED ORDER — BUPROPION HCL ER (XL) 150 MG PO TB24: ORAL_TABLET | ORAL | Status: DC

## 2013-05-15 MED ORDER — LORAZEPAM 0.5 MG PO TABS: 0.5000 mg | ORAL_TABLET | Freq: Three times a day (TID) | ORAL | Status: DC | PRN

## 2013-05-15 MED ORDER — DULOXETINE HCL 60 MG PO CPEP: ORAL_CAPSULE | ORAL | Status: DC
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
## 2013-05-16 ENCOUNTER — Ambulatory Visit (HOSPITAL_COMMUNITY): Payer: Self-pay | Admitting: Psychology

## 2013-05-19 ENCOUNTER — Ambulatory Visit (INDEPENDENT_AMBULATORY_CARE_PROVIDER_SITE_OTHER): Payer: BC Managed Care – PPO | Admitting: Psychology

## 2013-05-19 DIAGNOSIS — F332 Major depressive disorder, recurrent severe without psychotic features: Secondary | ICD-10-CM

## 2013-05-19 DIAGNOSIS — R413 Other amnesia: Secondary | ICD-10-CM

## 2013-05-19 DIAGNOSIS — F4310 Post-traumatic stress disorder, unspecified: Secondary | ICD-10-CM

## 2013-05-19 DIAGNOSIS — F4312 Post-traumatic stress disorder, chronic: Secondary | ICD-10-CM

## 2013-06-02 ENCOUNTER — Ambulatory Visit (INDEPENDENT_AMBULATORY_CARE_PROVIDER_SITE_OTHER): Payer: BC Managed Care – PPO | Admitting: Psychology

## 2013-06-02 DIAGNOSIS — F4310 Post-traumatic stress disorder, unspecified: Secondary | ICD-10-CM

## 2013-06-02 DIAGNOSIS — F332 Major depressive disorder, recurrent severe without psychotic features: Secondary | ICD-10-CM

## 2013-06-02 DIAGNOSIS — F5105 Insomnia due to other mental disorder: Secondary | ICD-10-CM

## 2013-06-02 DIAGNOSIS — F4312 Post-traumatic stress disorder, chronic: Secondary | ICD-10-CM

## 2013-06-02 DIAGNOSIS — F489 Nonpsychotic mental disorder, unspecified: Secondary | ICD-10-CM

## 2013-06-02 DIAGNOSIS — R413 Other amnesia: Secondary | ICD-10-CM

## 2013-07-03 ENCOUNTER — Ambulatory Visit (INDEPENDENT_AMBULATORY_CARE_PROVIDER_SITE_OTHER): Payer: BC Managed Care – PPO | Admitting: Psychology

## 2013-07-03 DIAGNOSIS — F4310 Post-traumatic stress disorder, unspecified: Secondary | ICD-10-CM

## 2013-07-03 DIAGNOSIS — F332 Major depressive disorder, recurrent severe without psychotic features: Secondary | ICD-10-CM

## 2013-07-03 DIAGNOSIS — F4312 Post-traumatic stress disorder, chronic: Secondary | ICD-10-CM

## 2013-07-07 ENCOUNTER — Encounter (HOSPITAL_COMMUNITY): Payer: Self-pay | Admitting: Psychology
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
## 2013-07-15 ENCOUNTER — Encounter (HOSPITAL_COMMUNITY): Payer: Self-pay | Admitting: Psychiatry

## 2013-07-15 ENCOUNTER — Ambulatory Visit (INDEPENDENT_AMBULATORY_CARE_PROVIDER_SITE_OTHER): Payer: BC Managed Care – PPO | Admitting: Psychiatry

## 2013-07-15 VITALS — BP 150/85 | Ht 71.0 in | Wt 374.0 lb

## 2013-07-15 DIAGNOSIS — F319 Bipolar disorder, unspecified: Secondary | ICD-10-CM

## 2013-07-15 DIAGNOSIS — F29 Unspecified psychosis not due to a substance or known physiological condition: Secondary | ICD-10-CM

## 2013-07-15 DIAGNOSIS — R52 Pain, unspecified: Secondary | ICD-10-CM

## 2013-07-15 DIAGNOSIS — R413 Other amnesia: Secondary | ICD-10-CM

## 2013-07-15 DIAGNOSIS — F332 Major depressive disorder, recurrent severe without psychotic features: Secondary | ICD-10-CM

## 2013-07-15 DIAGNOSIS — F5105 Insomnia due to other mental disorder: Secondary | ICD-10-CM

## 2013-07-15 DIAGNOSIS — F329 Major depressive disorder, single episode, unspecified: Secondary | ICD-10-CM

## 2013-07-15 DIAGNOSIS — F419 Anxiety disorder, unspecified: Secondary | ICD-10-CM

## 2013-07-15 MED ORDER — GABAPENTIN 300 MG PO CAPS: ORAL_CAPSULE | ORAL | Status: DC

## 2013-07-15 MED ORDER — CLONAZEPAM 1 MG PO TABS: 1.0000 mg | ORAL_TABLET | Freq: Three times a day (TID) | ORAL | Status: DC

## 2013-07-15 MED ORDER — DULOXETINE HCL 60 MG PO CPEP: ORAL_CAPSULE | ORAL | Status: DC

## 2013-07-15 MED ORDER — BUPROPION HCL ER (XL) 150 MG PO TB24: 150.0000 mg | ORAL_TABLET | Freq: Two times a day (BID) | ORAL | Status: DC

## 2013-07-15 MED ORDER — PROPRANOLOL HCL 10 MG PO TABS: 10.0000 mg | ORAL_TABLET | Freq: Three times a day (TID) | ORAL | Status: DC
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
## 2013-07-21 ENCOUNTER — Encounter (HOSPITAL_COMMUNITY): Payer: Self-pay | Admitting: Psychology
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
## 2013-08-01 ENCOUNTER — Encounter (HOSPITAL_COMMUNITY): Payer: Self-pay | Admitting: Psychology

## 2013-08-01 ENCOUNTER — Ambulatory Visit (INDEPENDENT_AMBULATORY_CARE_PROVIDER_SITE_OTHER): Payer: BC Managed Care – PPO | Admitting: Psychology

## 2013-08-01 DIAGNOSIS — F4312 Post-traumatic stress disorder, chronic: Secondary | ICD-10-CM

## 2013-08-01 DIAGNOSIS — F4310 Post-traumatic stress disorder, unspecified: Secondary | ICD-10-CM

## 2013-08-01 DIAGNOSIS — F332 Major depressive disorder, recurrent severe without psychotic features: Secondary | ICD-10-CM
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
## 2013-08-11 ENCOUNTER — Other Ambulatory Visit (HOSPITAL_COMMUNITY): Payer: Self-pay | Admitting: Internal Medicine

## 2013-08-11 DIAGNOSIS — R55 Syncope and collapse: Secondary | ICD-10-CM

## 2013-08-11 DIAGNOSIS — R413 Other amnesia: Secondary | ICD-10-CM

## 2013-08-12 ENCOUNTER — Ambulatory Visit (HOSPITAL_COMMUNITY): Payer: Self-pay | Admitting: Psychiatry

## 2013-08-14 ENCOUNTER — Telehealth (HOSPITAL_COMMUNITY): Payer: Self-pay | Admitting: *Deleted

## 2013-08-14 ENCOUNTER — Other Ambulatory Visit: Payer: Self-pay

## 2013-08-14 ENCOUNTER — Other Ambulatory Visit: Payer: Self-pay | Admitting: Internal Medicine

## 2013-08-14 DIAGNOSIS — R413 Other amnesia: Secondary | ICD-10-CM

## 2013-08-14 DIAGNOSIS — R55 Syncope and collapse: Secondary | ICD-10-CM

## 2013-08-15 NOTE — Telephone Encounter (Signed)
Dr Kieth Brightly has forms

## 2013-08-24 ENCOUNTER — Other Ambulatory Visit: Payer: Self-pay

## 2013-08-26 ENCOUNTER — Other Ambulatory Visit: Payer: Self-pay | Admitting: Internal Medicine

## 2013-08-26 ENCOUNTER — Ambulatory Visit
Admission: RE | Admit: 2013-08-26 | Discharge: 2013-08-26 | Disposition: A | Discharge status: Home / Self Care | Payer: BC Managed Care – PPO | Source: Ambulatory Visit | Attending: Internal Medicine | Admitting: Internal Medicine

## 2013-08-26 DIAGNOSIS — Z77018 Contact with and (suspected) exposure to other hazardous metals: Secondary | ICD-10-CM

## 2013-08-26 DIAGNOSIS — R55 Syncope and collapse: Secondary | ICD-10-CM

## 2013-08-26 DIAGNOSIS — R413 Other amnesia: Secondary | ICD-10-CM

## 2013-08-27 ENCOUNTER — Other Ambulatory Visit: Payer: Self-pay

## 2013-08-27 ENCOUNTER — Ambulatory Visit (INDEPENDENT_AMBULATORY_CARE_PROVIDER_SITE_OTHER): Payer: BC Managed Care – PPO | Admitting: Psychology

## 2013-08-27 ENCOUNTER — Encounter (HOSPITAL_COMMUNITY): Payer: Self-pay | Admitting: Psychology

## 2013-08-27 DIAGNOSIS — F332 Major depressive disorder, recurrent severe without psychotic features: Secondary | ICD-10-CM

## 2013-08-27 DIAGNOSIS — F4312 Post-traumatic stress disorder, chronic: Secondary | ICD-10-CM
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
## 2013-08-29 ENCOUNTER — Encounter (HOSPITAL_COMMUNITY): Payer: Self-pay | Admitting: Psychology
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
## 2013-09-19 ENCOUNTER — Encounter (HOSPITAL_COMMUNITY): Payer: Self-pay | Admitting: Psychiatry

## 2013-09-19 ENCOUNTER — Ambulatory Visit (INDEPENDENT_AMBULATORY_CARE_PROVIDER_SITE_OTHER): Payer: Self-pay | Admitting: Psychiatry

## 2013-09-19 VITALS — BP 120/80 | Ht 71.0 in | Wt 366.0 lb

## 2013-09-19 DIAGNOSIS — F29 Unspecified psychosis not due to a substance or known physiological condition: Secondary | ICD-10-CM

## 2013-09-19 DIAGNOSIS — F5105 Insomnia due to other mental disorder: Secondary | ICD-10-CM

## 2013-09-19 DIAGNOSIS — R52 Pain, unspecified: Secondary | ICD-10-CM

## 2013-09-19 DIAGNOSIS — F329 Major depressive disorder, single episode, unspecified: Secondary | ICD-10-CM

## 2013-09-19 DIAGNOSIS — R413 Other amnesia: Secondary | ICD-10-CM

## 2013-09-19 DIAGNOSIS — F319 Bipolar disorder, unspecified: Secondary | ICD-10-CM

## 2013-09-19 DIAGNOSIS — F419 Anxiety disorder, unspecified: Secondary | ICD-10-CM

## 2013-09-19 MED ORDER — CLONAZEPAM 1 MG PO TABS: 1.0000 mg | ORAL_TABLET | Freq: Four times a day (QID) | ORAL | Status: DC

## 2013-09-19 MED ORDER — DULOXETINE HCL 60 MG PO CPEP: ORAL_CAPSULE | ORAL | Status: DC

## 2013-09-19 MED ORDER — GABAPENTIN 300 MG PO CAPS: ORAL_CAPSULE | ORAL | Status: DC

## 2013-09-19 MED ORDER — BUPROPION HCL ER (XL) 150 MG PO TB24: 150.0000 mg | ORAL_TABLET | Freq: Two times a day (BID) | ORAL | Status: DC
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
## 2013-09-19 NOTE — Patient Instructions (Signed)
Latuda samples gradually increase to 80 mg per day

## 2013-09-24 ENCOUNTER — Ambulatory Visit (HOSPITAL_COMMUNITY): Payer: Self-pay | Admitting: Psychology

## 2013-10-15 ENCOUNTER — Encounter (HOSPITAL_COMMUNITY): Payer: Self-pay | Admitting: Psychiatry

## 2013-10-15 ENCOUNTER — Ambulatory Visit (INDEPENDENT_AMBULATORY_CARE_PROVIDER_SITE_OTHER): Payer: Self-pay | Admitting: Psychiatry

## 2013-10-15 VITALS — BP 140/80 | Ht 71.0 in | Wt 367.0 lb

## 2013-10-15 DIAGNOSIS — F329 Major depressive disorder, single episode, unspecified: Secondary | ICD-10-CM

## 2013-10-15 DIAGNOSIS — R413 Other amnesia: Secondary | ICD-10-CM

## 2013-10-15 DIAGNOSIS — F29 Unspecified psychosis not due to a substance or known physiological condition: Secondary | ICD-10-CM

## 2013-10-15 DIAGNOSIS — F319 Bipolar disorder, unspecified: Secondary | ICD-10-CM

## 2013-10-15 MED ORDER — ALPRAZOLAM 1 MG PO TABS
1.0000 mg | ORAL_TABLET | Freq: Four times a day (QID) | ORAL | Status: DC
Start: 2013-10-15 — End: 2013-12-11
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
## 2013-10-20 ENCOUNTER — Telehealth (HOSPITAL_COMMUNITY): Payer: Self-pay | Admitting: Psychiatry
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
## 2013-10-21 ENCOUNTER — Telehealth (HOSPITAL_COMMUNITY): Payer: Self-pay | Admitting: Psychiatry

## 2013-10-21 NOTE — Telephone Encounter (Signed)
She feels pt is safe and does not require hospitalization, but will try to get him in sooner to see me

## 2013-10-27 ENCOUNTER — Ambulatory Visit (INDEPENDENT_AMBULATORY_CARE_PROVIDER_SITE_OTHER): Payer: BC Managed Care – PPO | Admitting: Psychology

## 2013-10-27 DIAGNOSIS — F419 Anxiety disorder, unspecified: Secondary | ICD-10-CM

## 2013-10-27 DIAGNOSIS — F5105 Insomnia due to other mental disorder: Secondary | ICD-10-CM

## 2013-10-27 DIAGNOSIS — F4312 Post-traumatic stress disorder, chronic: Secondary | ICD-10-CM

## 2013-10-27 DIAGNOSIS — F4310 Post-traumatic stress disorder, unspecified: Secondary | ICD-10-CM

## 2013-10-27 DIAGNOSIS — F489 Nonpsychotic mental disorder, unspecified: Secondary | ICD-10-CM

## 2013-10-27 DIAGNOSIS — F319 Bipolar disorder, unspecified: Secondary | ICD-10-CM

## 2013-10-27 DIAGNOSIS — F411 Generalized anxiety disorder: Secondary | ICD-10-CM

## 2013-10-28 ENCOUNTER — Encounter (HOSPITAL_COMMUNITY): Payer: Self-pay | Admitting: Psychiatry

## 2013-10-28 ENCOUNTER — Ambulatory Visit (INDEPENDENT_AMBULATORY_CARE_PROVIDER_SITE_OTHER): Payer: Self-pay | Admitting: Psychiatry

## 2013-10-28 DIAGNOSIS — F4312 Post-traumatic stress disorder, chronic: Secondary | ICD-10-CM

## 2013-10-28 DIAGNOSIS — F4310 Post-traumatic stress disorder, unspecified: Secondary | ICD-10-CM
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
## 2013-11-12 ENCOUNTER — Ambulatory Visit (HOSPITAL_COMMUNITY): Payer: Self-pay | Admitting: Psychiatry

## 2013-11-19 ENCOUNTER — Ambulatory Visit (INDEPENDENT_AMBULATORY_CARE_PROVIDER_SITE_OTHER): Payer: BC Managed Care – PPO | Admitting: Psychology

## 2013-11-19 DIAGNOSIS — F4310 Post-traumatic stress disorder, unspecified: Secondary | ICD-10-CM

## 2013-11-19 DIAGNOSIS — F4312 Post-traumatic stress disorder, chronic: Secondary | ICD-10-CM

## 2013-11-19 DIAGNOSIS — F332 Major depressive disorder, recurrent severe without psychotic features: Secondary | ICD-10-CM

## 2013-11-25 ENCOUNTER — Ambulatory Visit (HOSPITAL_COMMUNITY): Payer: Self-pay | Admitting: Psychiatry

## 2013-11-26 ENCOUNTER — Encounter (HOSPITAL_COMMUNITY): Payer: Self-pay | Admitting: Psychology
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
## 2013-12-11 ENCOUNTER — Encounter (HOSPITAL_COMMUNITY): Payer: Self-pay | Admitting: Psychiatry

## 2013-12-11 ENCOUNTER — Telehealth (HOSPITAL_COMMUNITY): Payer: Self-pay | Admitting: *Deleted

## 2013-12-11 ENCOUNTER — Ambulatory Visit (INDEPENDENT_AMBULATORY_CARE_PROVIDER_SITE_OTHER): Payer: BC Managed Care – PPO | Admitting: Psychiatry

## 2013-12-11 VITALS — Ht 71.0 in | Wt 370.0 lb

## 2013-12-11 DIAGNOSIS — F29 Unspecified psychosis not due to a substance or known physiological condition: Secondary | ICD-10-CM

## 2013-12-11 DIAGNOSIS — R413 Other amnesia: Secondary | ICD-10-CM

## 2013-12-11 DIAGNOSIS — F419 Anxiety disorder, unspecified: Secondary | ICD-10-CM

## 2013-12-11 DIAGNOSIS — F5105 Insomnia due to other mental disorder: Secondary | ICD-10-CM

## 2013-12-11 DIAGNOSIS — F4312 Post-traumatic stress disorder, chronic: Secondary | ICD-10-CM

## 2013-12-11 DIAGNOSIS — F329 Major depressive disorder, single episode, unspecified: Secondary | ICD-10-CM

## 2013-12-11 DIAGNOSIS — R52 Pain, unspecified: Secondary | ICD-10-CM

## 2013-12-11 MED ORDER — ALPRAZOLAM 1 MG PO TABS: 1.0000 mg | ORAL_TABLET | Freq: Four times a day (QID) | ORAL | Status: DC

## 2013-12-11 MED ORDER — GABAPENTIN 300 MG PO CAPS: ORAL_CAPSULE | ORAL | Status: DC

## 2013-12-11 MED ORDER — BUPROPION HCL ER (XL) 150 MG PO TB24: 150.0000 mg | ORAL_TABLET | Freq: Two times a day (BID) | ORAL | Status: DC

## 2013-12-11 MED ORDER — LURASIDONE HCL 80 MG PO TABS: ORAL_TABLET | ORAL | Status: DC

## 2013-12-11 MED ORDER — DULOXETINE HCL 60 MG PO CPEP: ORAL_CAPSULE | ORAL | Status: DC
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
## 2013-12-17 ENCOUNTER — Ambulatory Visit (HOSPITAL_COMMUNITY): Payer: Self-pay | Admitting: Psychology

## 2013-12-23 ENCOUNTER — Encounter (HOSPITAL_COMMUNITY): Payer: Self-pay | Admitting: Psychology
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
## 2014-01-09 ENCOUNTER — Ambulatory Visit (HOSPITAL_COMMUNITY): Payer: Self-pay | Admitting: Psychology

## 2014-01-19 ENCOUNTER — Encounter (HOSPITAL_COMMUNITY): Payer: Self-pay | Admitting: Psychology

## 2014-01-19 ENCOUNTER — Ambulatory Visit (INDEPENDENT_AMBULATORY_CARE_PROVIDER_SITE_OTHER): Payer: BC Managed Care – PPO | Admitting: Psychology

## 2014-01-19 DIAGNOSIS — F4312 Post-traumatic stress disorder, chronic: Secondary | ICD-10-CM

## 2014-01-19 DIAGNOSIS — F4310 Post-traumatic stress disorder, unspecified: Secondary | ICD-10-CM

## 2014-01-19 DIAGNOSIS — F419 Anxiety disorder, unspecified: Secondary | ICD-10-CM

## 2014-01-19 DIAGNOSIS — F411 Generalized anxiety disorder: Secondary | ICD-10-CM

## 2014-01-19 DIAGNOSIS — F332 Major depressive disorder, recurrent severe without psychotic features: Secondary | ICD-10-CM
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
## 2014-01-22 ENCOUNTER — Ambulatory Visit (HOSPITAL_COMMUNITY): Payer: Self-pay | Admitting: Psychiatry

## 2014-02-02 ENCOUNTER — Ambulatory Visit (HOSPITAL_COMMUNITY): Payer: Self-pay | Admitting: Psychiatry

## 2014-02-04 ENCOUNTER — Encounter (HOSPITAL_COMMUNITY): Payer: Self-pay | Admitting: Psychiatry

## 2014-02-04 ENCOUNTER — Telehealth (HOSPITAL_COMMUNITY): Payer: Self-pay | Admitting: *Deleted

## 2014-02-04 ENCOUNTER — Ambulatory Visit (INDEPENDENT_AMBULATORY_CARE_PROVIDER_SITE_OTHER): Payer: BC Managed Care – PPO | Admitting: Psychiatry

## 2014-02-04 VITALS — BP 120/70 | Ht 71.0 in | Wt 360.0 lb

## 2014-02-04 DIAGNOSIS — F332 Major depressive disorder, recurrent severe without psychotic features: Secondary | ICD-10-CM

## 2014-02-04 DIAGNOSIS — F419 Anxiety disorder, unspecified: Secondary | ICD-10-CM

## 2014-02-04 DIAGNOSIS — R413 Other amnesia: Secondary | ICD-10-CM

## 2014-02-04 DIAGNOSIS — F329 Major depressive disorder, single episode, unspecified: Secondary | ICD-10-CM

## 2014-02-04 DIAGNOSIS — F323 Major depressive disorder, single episode, severe with psychotic features: Secondary | ICD-10-CM

## 2014-02-04 DIAGNOSIS — R52 Pain, unspecified: Secondary | ICD-10-CM

## 2014-02-04 DIAGNOSIS — F5105 Insomnia due to other mental disorder: Secondary | ICD-10-CM

## 2014-02-04 DIAGNOSIS — F4312 Post-traumatic stress disorder, chronic: Secondary | ICD-10-CM

## 2014-02-04 MED ORDER — BUPROPION HCL ER (XL) 150 MG PO TB24: 150.0000 mg | ORAL_TABLET | Freq: Two times a day (BID) | ORAL | Status: DC

## 2014-02-04 MED ORDER — LURASIDONE HCL 80 MG PO TABS: ORAL_TABLET | ORAL | Status: DC

## 2014-02-04 MED ORDER — ALPRAZOLAM 1 MG PO TABS: 1.0000 mg | ORAL_TABLET | Freq: Four times a day (QID) | ORAL | Status: DC

## 2014-02-04 MED ORDER — GABAPENTIN 300 MG PO CAPS: ORAL_CAPSULE | ORAL | Status: DC

## 2014-02-04 MED ORDER — DULOXETINE HCL 60 MG PO CPEP: ORAL_CAPSULE | ORAL | Status: DC

## 2014-02-04 MED ORDER — TRAZODONE HCL 100 MG PO TABS: 100.0000 mg | ORAL_TABLET | Freq: Every day | ORAL | Status: DC

## 2014-02-04 NOTE — Telephone Encounter (Signed)
Samples givien
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
## 2014-02-05 ENCOUNTER — Telehealth (HOSPITAL_COMMUNITY): Payer: Self-pay | Admitting: *Deleted

## 2014-02-05 ENCOUNTER — Ambulatory Visit (INDEPENDENT_AMBULATORY_CARE_PROVIDER_SITE_OTHER): Payer: BC Managed Care – PPO | Admitting: Psychology

## 2014-02-05 DIAGNOSIS — F332 Major depressive disorder, recurrent severe without psychotic features: Secondary | ICD-10-CM

## 2014-02-05 DIAGNOSIS — F4310 Post-traumatic stress disorder, unspecified: Secondary | ICD-10-CM

## 2014-02-05 DIAGNOSIS — F4312 Post-traumatic stress disorder, chronic: Secondary | ICD-10-CM

## 2014-02-05 NOTE — Telephone Encounter (Signed)
Left message

## 2014-02-06 ENCOUNTER — Encounter (HOSPITAL_COMMUNITY): Payer: Self-pay | Admitting: Psychology
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
## 2014-02-09 ENCOUNTER — Telehealth (HOSPITAL_COMMUNITY): Payer: Self-pay | Admitting: *Deleted

## 2014-02-10 NOTE — Telephone Encounter (Signed)
done

## 2014-02-12 NOTE — Telephone Encounter (Signed)
Called, left message to call back.

## 2014-03-04 ENCOUNTER — Ambulatory Visit (INDEPENDENT_AMBULATORY_CARE_PROVIDER_SITE_OTHER): Payer: BC Managed Care – PPO | Admitting: Psychiatry

## 2014-03-04 ENCOUNTER — Encounter (HOSPITAL_COMMUNITY): Payer: Self-pay | Admitting: Psychiatry

## 2014-03-04 VITALS — Ht 71.0 in | Wt 360.0 lb

## 2014-03-04 DIAGNOSIS — F4312 Post-traumatic stress disorder, chronic: Secondary | ICD-10-CM

## 2014-03-04 DIAGNOSIS — F329 Major depressive disorder, single episode, unspecified: Secondary | ICD-10-CM

## 2014-03-04 DIAGNOSIS — R413 Other amnesia: Secondary | ICD-10-CM

## 2014-03-04 DIAGNOSIS — F323 Major depressive disorder, single episode, severe with psychotic features: Secondary | ICD-10-CM

## 2014-03-04 MED ORDER — DULOXETINE HCL 60 MG PO CPEP: 60.0000 mg | ORAL_CAPSULE | Freq: Two times a day (BID) | ORAL | Status: DC
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
## 2014-03-05 ENCOUNTER — Ambulatory Visit (INDEPENDENT_AMBULATORY_CARE_PROVIDER_SITE_OTHER): Payer: BC Managed Care – PPO | Admitting: Psychology

## 2014-03-05 DIAGNOSIS — F4312 Post-traumatic stress disorder, chronic: Secondary | ICD-10-CM

## 2014-03-05 DIAGNOSIS — F4310 Post-traumatic stress disorder, unspecified: Secondary | ICD-10-CM

## 2014-03-05 DIAGNOSIS — F329 Major depressive disorder, single episode, unspecified: Secondary | ICD-10-CM

## 2014-03-09 ENCOUNTER — Encounter (HOSPITAL_COMMUNITY): Payer: Self-pay | Admitting: Psychology
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
## 2014-04-01 ENCOUNTER — Ambulatory Visit (INDEPENDENT_AMBULATORY_CARE_PROVIDER_SITE_OTHER): Payer: Medicare Other | Admitting: Psychiatry

## 2014-04-01 ENCOUNTER — Encounter (HOSPITAL_COMMUNITY): Payer: Self-pay | Admitting: Psychiatry

## 2014-04-01 VITALS — BP 110/70 | Ht 71.0 in | Wt 357.0 lb

## 2014-04-01 DIAGNOSIS — F332 Major depressive disorder, recurrent severe without psychotic features: Secondary | ICD-10-CM

## 2014-04-01 DIAGNOSIS — R413 Other amnesia: Secondary | ICD-10-CM | POA: Diagnosis not present

## 2014-04-01 DIAGNOSIS — F4312 Post-traumatic stress disorder, chronic: Secondary | ICD-10-CM

## 2014-04-01 DIAGNOSIS — F419 Anxiety disorder, unspecified: Secondary | ICD-10-CM

## 2014-04-01 DIAGNOSIS — R52 Pain, unspecified: Secondary | ICD-10-CM

## 2014-04-01 DIAGNOSIS — F5105 Insomnia due to other mental disorder: Secondary | ICD-10-CM

## 2014-04-01 DIAGNOSIS — F323 Major depressive disorder, single episode, severe with psychotic features: Secondary | ICD-10-CM | POA: Diagnosis not present

## 2014-04-01 MED ORDER — DULOXETINE HCL 60 MG PO CPEP: 60.0000 mg | ORAL_CAPSULE | Freq: Two times a day (BID) | ORAL | Status: DC

## 2014-04-01 MED ORDER — ALPRAZOLAM 1 MG PO TABS: 1.0000 mg | ORAL_TABLET | Freq: Four times a day (QID) | ORAL | Status: DC

## 2014-04-01 MED ORDER — BUPROPION HCL ER (XL) 150 MG PO TB24: 150.0000 mg | ORAL_TABLET | Freq: Two times a day (BID) | ORAL | Status: DC

## 2014-04-01 MED ORDER — GABAPENTIN 300 MG PO CAPS: ORAL_CAPSULE | ORAL | Status: DC

## 2014-04-01 MED ORDER — TRAZODONE HCL 100 MG PO TABS: 100.0000 mg | ORAL_TABLET | Freq: Every day | ORAL | Status: DC
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
## 2014-04-03 ENCOUNTER — Ambulatory Visit (INDEPENDENT_AMBULATORY_CARE_PROVIDER_SITE_OTHER): Payer: Medicare Other | Admitting: Psychology

## 2014-04-03 ENCOUNTER — Encounter (HOSPITAL_COMMUNITY): Payer: Self-pay | Admitting: Psychology

## 2014-04-03 DIAGNOSIS — F4310 Post-traumatic stress disorder, unspecified: Secondary | ICD-10-CM | POA: Diagnosis not present

## 2014-04-03 DIAGNOSIS — F332 Major depressive disorder, recurrent severe without psychotic features: Secondary | ICD-10-CM

## 2014-04-03 DIAGNOSIS — F4312 Post-traumatic stress disorder, chronic: Secondary | ICD-10-CM
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
## 2014-04-22 DIAGNOSIS — M549 Dorsalgia, unspecified: Secondary | ICD-10-CM | POA: Diagnosis not present

## 2014-05-07 ENCOUNTER — Ambulatory Visit (INDEPENDENT_AMBULATORY_CARE_PROVIDER_SITE_OTHER): Payer: Medicare Other | Admitting: Psychology

## 2014-05-07 DIAGNOSIS — F4310 Post-traumatic stress disorder, unspecified: Secondary | ICD-10-CM

## 2014-05-07 DIAGNOSIS — F332 Major depressive disorder, recurrent severe without psychotic features: Secondary | ICD-10-CM

## 2014-05-07 DIAGNOSIS — F4312 Post-traumatic stress disorder, chronic: Secondary | ICD-10-CM

## 2014-05-08 ENCOUNTER — Encounter (HOSPITAL_COMMUNITY): Payer: Self-pay | Admitting: Psychology
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
## 2014-05-13 ENCOUNTER — Ambulatory Visit (HOSPITAL_COMMUNITY): Payer: Self-pay | Admitting: Psychiatry

## 2014-05-28 ENCOUNTER — Ambulatory Visit (INDEPENDENT_AMBULATORY_CARE_PROVIDER_SITE_OTHER): Payer: Medicare Other | Admitting: Psychiatry

## 2014-05-28 ENCOUNTER — Encounter (HOSPITAL_COMMUNITY): Payer: Self-pay | Admitting: Psychiatry

## 2014-05-28 VITALS — BP 123/78 | HR 82 | Ht 71.0 in | Wt 352.6 lb

## 2014-05-28 DIAGNOSIS — F4312 Post-traumatic stress disorder, chronic: Secondary | ICD-10-CM

## 2014-05-28 DIAGNOSIS — R52 Pain, unspecified: Secondary | ICD-10-CM

## 2014-05-28 DIAGNOSIS — R413 Other amnesia: Secondary | ICD-10-CM

## 2014-05-28 DIAGNOSIS — F332 Major depressive disorder, recurrent severe without psychotic features: Secondary | ICD-10-CM

## 2014-05-28 DIAGNOSIS — F323 Major depressive disorder, single episode, severe with psychotic features: Secondary | ICD-10-CM

## 2014-05-28 DIAGNOSIS — F5105 Insomnia due to other mental disorder: Secondary | ICD-10-CM

## 2014-05-28 DIAGNOSIS — F419 Anxiety disorder, unspecified: Secondary | ICD-10-CM

## 2014-05-28 MED ORDER — DULOXETINE HCL 60 MG PO CPEP: 60.0000 mg | ORAL_CAPSULE | Freq: Two times a day (BID) | ORAL | Status: DC

## 2014-05-28 MED ORDER — TRAZODONE HCL 100 MG PO TABS: 100.0000 mg | ORAL_TABLET | Freq: Every day | ORAL | Status: DC

## 2014-05-28 MED ORDER — BUPROPION HCL ER (XL) 150 MG PO TB24: 150.0000 mg | ORAL_TABLET | Freq: Two times a day (BID) | ORAL | Status: DC

## 2014-05-28 MED ORDER — GABAPENTIN 300 MG PO CAPS: ORAL_CAPSULE | ORAL | Status: DC

## 2014-05-28 MED ORDER — ALPRAZOLAM 1 MG PO TABS: 1.0000 mg | ORAL_TABLET | Freq: Four times a day (QID) | ORAL | Status: DC
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
## 2014-06-05 ENCOUNTER — Ambulatory Visit (INDEPENDENT_AMBULATORY_CARE_PROVIDER_SITE_OTHER): Payer: Medicare Other | Admitting: Psychology

## 2014-06-05 DIAGNOSIS — F332 Major depressive disorder, recurrent severe without psychotic features: Secondary | ICD-10-CM | POA: Diagnosis not present

## 2014-06-05 DIAGNOSIS — F4310 Post-traumatic stress disorder, unspecified: Secondary | ICD-10-CM

## 2014-06-05 DIAGNOSIS — F4312 Post-traumatic stress disorder, chronic: Secondary | ICD-10-CM

## 2014-07-07 ENCOUNTER — Ambulatory Visit (INDEPENDENT_AMBULATORY_CARE_PROVIDER_SITE_OTHER): Payer: Medicare HMO | Admitting: Psychology

## 2014-07-07 DIAGNOSIS — F4310 Post-traumatic stress disorder, unspecified: Secondary | ICD-10-CM | POA: Diagnosis not present

## 2014-07-07 DIAGNOSIS — F332 Major depressive disorder, recurrent severe without psychotic features: Secondary | ICD-10-CM | POA: Diagnosis not present

## 2014-07-07 DIAGNOSIS — F4312 Post-traumatic stress disorder, chronic: Secondary | ICD-10-CM

## 2014-07-09 ENCOUNTER — Ambulatory Visit (INDEPENDENT_AMBULATORY_CARE_PROVIDER_SITE_OTHER): Payer: Medicare Other | Admitting: Psychiatry

## 2014-07-09 ENCOUNTER — Encounter (HOSPITAL_COMMUNITY): Payer: Self-pay | Admitting: Psychiatry

## 2014-07-09 VITALS — BP 132/71 | HR 95 | Ht 71.0 in | Wt 355.0 lb

## 2014-07-09 DIAGNOSIS — R413 Other amnesia: Secondary | ICD-10-CM | POA: Diagnosis not present

## 2014-07-09 DIAGNOSIS — F323 Major depressive disorder, single episode, severe with psychotic features: Secondary | ICD-10-CM

## 2014-07-09 DIAGNOSIS — F5105 Insomnia due to other mental disorder: Secondary | ICD-10-CM

## 2014-07-09 DIAGNOSIS — R52 Pain, unspecified: Secondary | ICD-10-CM

## 2014-07-09 DIAGNOSIS — F4312 Post-traumatic stress disorder, chronic: Secondary | ICD-10-CM

## 2014-07-09 DIAGNOSIS — F419 Anxiety disorder, unspecified: Secondary | ICD-10-CM

## 2014-07-09 MED ORDER — DULOXETINE HCL 60 MG PO CPEP: 60.0000 mg | ORAL_CAPSULE | Freq: Two times a day (BID) | ORAL | Status: DC

## 2014-07-09 MED ORDER — TRAZODONE HCL 100 MG PO TABS: 100.0000 mg | ORAL_TABLET | Freq: Every day | ORAL | Status: DC

## 2014-07-09 MED ORDER — ALPRAZOLAM 1 MG PO TABS: 1.0000 mg | ORAL_TABLET | Freq: Four times a day (QID) | ORAL | Status: DC

## 2014-07-09 MED ORDER — BUPROPION HCL ER (XL) 150 MG PO TB24: 150.0000 mg | ORAL_TABLET | Freq: Two times a day (BID) | ORAL | Status: DC

## 2014-07-09 MED ORDER — GABAPENTIN 300 MG PO CAPS: ORAL_CAPSULE | ORAL | Status: DC
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
## 2014-07-10 ENCOUNTER — Encounter (HOSPITAL_COMMUNITY): Payer: Self-pay | Admitting: Psychology
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
## 2014-08-04 ENCOUNTER — Ambulatory Visit (INDEPENDENT_AMBULATORY_CARE_PROVIDER_SITE_OTHER): Payer: Commercial Managed Care - HMO | Admitting: Psychology

## 2014-08-04 DIAGNOSIS — F419 Anxiety disorder, unspecified: Secondary | ICD-10-CM

## 2014-08-04 DIAGNOSIS — F4312 Post-traumatic stress disorder, chronic: Secondary | ICD-10-CM

## 2014-08-04 DIAGNOSIS — F332 Major depressive disorder, recurrent severe without psychotic features: Secondary | ICD-10-CM

## 2014-08-05 ENCOUNTER — Encounter (HOSPITAL_COMMUNITY): Payer: Self-pay | Admitting: Psychology
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
## 2014-08-20 ENCOUNTER — Encounter (HOSPITAL_COMMUNITY): Payer: Self-pay | Admitting: Psychiatry

## 2014-08-20 ENCOUNTER — Ambulatory Visit (INDEPENDENT_AMBULATORY_CARE_PROVIDER_SITE_OTHER): Payer: Commercial Managed Care - HMO | Admitting: Psychiatry

## 2014-08-20 VITALS — BP 125/91 | HR 82 | Ht 71.0 in | Wt 365.0 lb

## 2014-08-20 DIAGNOSIS — F333 Major depressive disorder, recurrent, severe with psychotic symptoms: Secondary | ICD-10-CM

## 2014-08-20 DIAGNOSIS — F5105 Insomnia due to other mental disorder: Secondary | ICD-10-CM

## 2014-08-20 DIAGNOSIS — R413 Other amnesia: Secondary | ICD-10-CM

## 2014-08-20 DIAGNOSIS — F4312 Post-traumatic stress disorder, chronic: Secondary | ICD-10-CM

## 2014-08-20 DIAGNOSIS — R52 Pain, unspecified: Secondary | ICD-10-CM

## 2014-08-20 DIAGNOSIS — F419 Anxiety disorder, unspecified: Secondary | ICD-10-CM

## 2014-08-20 MED ORDER — ALPRAZOLAM 1 MG PO TABS: 1.0000 mg | ORAL_TABLET | Freq: Four times a day (QID) | ORAL | Status: DC

## 2014-08-20 MED ORDER — GABAPENTIN 300 MG PO CAPS: ORAL_CAPSULE | ORAL | Status: DC

## 2014-08-20 MED ORDER — BENZTROPINE MESYLATE 0.5 MG PO TABS: 0.5000 mg | ORAL_TABLET | Freq: Two times a day (BID) | ORAL | Status: DC

## 2014-08-20 MED ORDER — DULOXETINE HCL 60 MG PO CPEP: 60.0000 mg | ORAL_CAPSULE | Freq: Two times a day (BID) | ORAL | Status: DC

## 2014-08-20 MED ORDER — TRAZODONE HCL 100 MG PO TABS: 100.0000 mg | ORAL_TABLET | Freq: Every day | ORAL | Status: DC

## 2014-08-20 MED ORDER — BUPROPION HCL ER (XL) 150 MG PO TB24: ORAL_TABLET | ORAL | Status: DC

## 2014-08-20 MED ORDER — LURASIDONE HCL 80 MG PO TABS: ORAL_TABLET | ORAL | Status: DC
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
## 2014-09-10 ENCOUNTER — Ambulatory Visit (HOSPITAL_COMMUNITY): Payer: Self-pay | Admitting: Psychology

## 2014-10-05 ENCOUNTER — Telehealth (HOSPITAL_COMMUNITY): Payer: Self-pay | Admitting: *Deleted

## 2014-10-05 NOTE — Telephone Encounter (Signed)
Called pt but did not have voicemail to leave message. Will try at another time

## 2014-10-05 NOTE — Telephone Encounter (Signed)
FAXED PHARMACY REQUEST FOR: BENZTROPINE MES 1 MG. TABLET BUPROPION HCL XL 150MG  DULOXETINE HCL DR 60MG  CAP GABAPENTIN 300 MG. CAP TRAZODONE 100 MG TABLET

## 2014-10-07 NOTE — Telephone Encounter (Signed)
Spoke with pt wife Garreth Burnsworth and she stated that they switched over to [facility]. Per pt wife, if is completely out of his prescription. Informed pt wife that pt medication was last sent to his local pharmacy 08-20-14 and he have 90 days prescription for his Wellbutrin and 30 days script for both his Benztropine and Cymbalta. Per pt wife, they will go to local pharmacy for now due to pt being out of medications and can not wait until mail order mail his prescription. Per pt wife, when pt is up for his next medication fill, they will call office to get it redirected to [facility].

## 2014-10-14 ENCOUNTER — Ambulatory Visit (INDEPENDENT_AMBULATORY_CARE_PROVIDER_SITE_OTHER): Payer: Medicare HMO | Admitting: Psychiatry

## 2014-10-14 ENCOUNTER — Encounter (HOSPITAL_COMMUNITY): Payer: Self-pay | Admitting: Psychiatry

## 2014-10-14 VITALS — BP 136/68 | HR 89 | Ht 71.0 in | Wt 373.0 lb

## 2014-10-14 DIAGNOSIS — R413 Other amnesia: Secondary | ICD-10-CM

## 2014-10-14 DIAGNOSIS — F323 Major depressive disorder, single episode, severe with psychotic features: Secondary | ICD-10-CM

## 2014-10-14 DIAGNOSIS — R52 Pain, unspecified: Secondary | ICD-10-CM

## 2014-10-14 DIAGNOSIS — F419 Anxiety disorder, unspecified: Secondary | ICD-10-CM

## 2014-10-14 DIAGNOSIS — F5105 Insomnia due to other mental disorder: Secondary | ICD-10-CM

## 2014-10-14 DIAGNOSIS — F4312 Post-traumatic stress disorder, chronic: Secondary | ICD-10-CM

## 2014-10-14 MED ORDER — BUPROPION HCL ER (XL) 150 MG PO TB24: 150.0000 mg | ORAL_TABLET | Freq: Two times a day (BID) | ORAL | Status: DC

## 2014-10-14 MED ORDER — GABAPENTIN 300 MG PO CAPS: ORAL_CAPSULE | ORAL | Status: DC

## 2014-10-14 MED ORDER — DULOXETINE HCL 60 MG PO CPEP: 60.0000 mg | ORAL_CAPSULE | Freq: Two times a day (BID) | ORAL | Status: DC

## 2014-10-14 MED ORDER — ALPRAZOLAM 1 MG PO TABS: 1.0000 mg | ORAL_TABLET | Freq: Four times a day (QID) | ORAL | Status: DC

## 2014-10-14 MED ORDER — TRAZODONE HCL 100 MG PO TABS: 100.0000 mg | ORAL_TABLET | Freq: Every day | ORAL | Status: DC

## 2014-10-14 MED ORDER — BENZTROPINE MESYLATE 0.5 MG PO TABS: 0.5000 mg | ORAL_TABLET | Freq: Two times a day (BID) | ORAL | Status: DC
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
## 2014-10-20 ENCOUNTER — Ambulatory Visit (HOSPITAL_COMMUNITY): Payer: Self-pay | Admitting: Psychiatry

## 2014-11-09 ENCOUNTER — Ambulatory Visit (HOSPITAL_COMMUNITY): Payer: Self-pay | Admitting: Psychology

## 2014-11-13 ENCOUNTER — Ambulatory Visit (INDEPENDENT_AMBULATORY_CARE_PROVIDER_SITE_OTHER): Payer: Medicare HMO | Admitting: Psychology

## 2014-11-13 DIAGNOSIS — F4312 Post-traumatic stress disorder, chronic: Secondary | ICD-10-CM | POA: Diagnosis not present

## 2014-11-13 DIAGNOSIS — F5105 Insomnia due to other mental disorder: Secondary | ICD-10-CM | POA: Diagnosis not present

## 2014-11-13 DIAGNOSIS — F489 Nonpsychotic mental disorder, unspecified: Secondary | ICD-10-CM

## 2014-11-26 ENCOUNTER — Telehealth (HOSPITAL_COMMUNITY): Payer: Self-pay | Admitting: *Deleted

## 2014-11-26 NOTE — Telephone Encounter (Signed)
LATUDA 80 MG. $2,000 a month.   wants samples she can pick up maybe tomorrow.   He will take last dose tonight.

## 2014-11-26 NOTE — Telephone Encounter (Signed)
I don't have any 80 mg, but do have 120 mg if they want to try that dose

## 2014-11-30 ENCOUNTER — Telehealth (HOSPITAL_COMMUNITY): Payer: Self-pay | Admitting: *Deleted

## 2014-11-30 NOTE — Telephone Encounter (Signed)
Per Dr. Harrington Challenger to give pt samples for his Latuda.. Pt is taking 80 mg QD. Gived pt 4 (40mg ) and 4(120mg ). Pt wife Cabe Lashley came to pick up samples for pt. Pt wife D/L number is [phone].

## 2014-11-30 NOTE — Telephone Encounter (Signed)
Per Dr. Harrington Challenger to give pt samples for his Latuda.. Pt is taking 80 mg QD. Gived pt 4 (40mg ) and 4(120mg ). Pt wife Eriverto Byrnes came to pick up samples for pt. Pt wife D/L number is [phone].

## 2014-12-03 ENCOUNTER — Telehealth (HOSPITAL_COMMUNITY): Payer: Self-pay | Admitting: *Deleted

## 2014-12-03 NOTE — Telephone Encounter (Signed)
Need to confirm the quantity.   Script is written for 480 tablets.

## 2014-12-03 NOTE — Telephone Encounter (Signed)
Route to [facility], Which med?

## 2014-12-04 NOTE — Telephone Encounter (Signed)
Per Larene Beach from [facility], Pt had a quantity of 480 on his Xanax and they was just checking to make sure pt can have all of it. Informed the pharmacist that pt uses mail order and she stated that pt had brought his script to them instead [facility]) and would like to get permission from Dr. Harrington Challenger to go ahead and fill pt script that have the quantity of 480.

## 2014-12-04 NOTE — Telephone Encounter (Signed)
Yes that is ok

## 2014-12-14 ENCOUNTER — Ambulatory Visit (INDEPENDENT_AMBULATORY_CARE_PROVIDER_SITE_OTHER): Payer: Commercial Managed Care - HMO | Admitting: Psychiatry

## 2014-12-14 ENCOUNTER — Encounter (HOSPITAL_COMMUNITY): Payer: Self-pay | Admitting: Psychiatry

## 2014-12-14 ENCOUNTER — Telehealth (HOSPITAL_COMMUNITY): Payer: Self-pay | Admitting: *Deleted

## 2014-12-14 VITALS — BP 122/80 | HR 98 | Ht 71.0 in | Wt 368.0 lb

## 2014-12-14 DIAGNOSIS — F323 Major depressive disorder, single episode, severe with psychotic features: Secondary | ICD-10-CM

## 2014-12-14 DIAGNOSIS — F4312 Post-traumatic stress disorder, chronic: Secondary | ICD-10-CM

## 2014-12-14 DIAGNOSIS — R413 Other amnesia: Secondary | ICD-10-CM

## 2014-12-14 MED ORDER — BUPROPION HCL ER (XL) 150 MG PO TB24: 150.0000 mg | ORAL_TABLET | Freq: Two times a day (BID) | ORAL | Status: DC

## 2014-12-14 MED ORDER — DULOXETINE HCL 60 MG PO CPEP: 60.0000 mg | ORAL_CAPSULE | Freq: Two times a day (BID) | ORAL | Status: DC

## 2014-12-14 MED ORDER — BENZTROPINE MESYLATE 0.5 MG PO TABS: 0.5000 mg | ORAL_TABLET | Freq: Two times a day (BID) | ORAL | Status: DC

## 2014-12-14 MED ORDER — TRAZODONE HCL 100 MG PO TABS: 100.0000 mg | ORAL_TABLET | Freq: Every day | ORAL | Status: DC

## 2014-12-14 NOTE — Telephone Encounter (Signed)
Pt wife called stating she called the Pt assistance for Latuda and they told pt did not qualify for assistance due to having medicare. Pt wife states that right now they have about 4 months worth of samples so pt should be ok until it runs out. Per pt wife, the pharmacy is telling them that their pay for the Latuda is $250-300.00. Pt wife number is [phone].
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
## 2014-12-14 NOTE — Telephone Encounter (Signed)
Spoke with scott at pt pharmacy and informed him that Dr. Harrington Challenger did approve for pt to get the 480 quantity for his medication.

## 2014-12-15 ENCOUNTER — Ambulatory Visit (HOSPITAL_COMMUNITY): Payer: Self-pay | Admitting: Psychology

## 2014-12-15 NOTE — Telephone Encounter (Signed)
We can try to keep giving samples. Please call drug rep to bring in more

## 2014-12-18 NOTE — Telephone Encounter (Signed)
Called pt wife and she showed understanding.

## 2015-01-15 ENCOUNTER — Ambulatory Visit (INDEPENDENT_AMBULATORY_CARE_PROVIDER_SITE_OTHER): Payer: Medicare HMO | Admitting: Psychology

## 2015-01-15 ENCOUNTER — Encounter (HOSPITAL_COMMUNITY): Payer: Self-pay | Admitting: Psychology

## 2015-01-15 DIAGNOSIS — F332 Major depressive disorder, recurrent severe without psychotic features: Secondary | ICD-10-CM | POA: Diagnosis not present

## 2015-01-15 DIAGNOSIS — F4312 Post-traumatic stress disorder, chronic: Secondary | ICD-10-CM

## 2015-01-15 DIAGNOSIS — F419 Anxiety disorder, unspecified: Secondary | ICD-10-CM | POA: Diagnosis not present

## 2015-01-15 DIAGNOSIS — F489 Nonpsychotic mental disorder, unspecified: Secondary | ICD-10-CM | POA: Diagnosis not present

## 2015-01-15 DIAGNOSIS — F5105 Insomnia due to other mental disorder: Secondary | ICD-10-CM
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
## 2015-02-12 ENCOUNTER — Ambulatory Visit (HOSPITAL_COMMUNITY): Payer: Self-pay | Admitting: Psychology

## 2015-02-12 ENCOUNTER — Ambulatory Visit (HOSPITAL_COMMUNITY): Payer: Self-pay | Admitting: Psychiatry

## 2015-02-15 ENCOUNTER — Encounter (HOSPITAL_COMMUNITY): Payer: Self-pay | Admitting: *Deleted

## 2015-02-17 ENCOUNTER — Ambulatory Visit (INDEPENDENT_AMBULATORY_CARE_PROVIDER_SITE_OTHER): Payer: Commercial Managed Care - HMO | Admitting: Psychiatry

## 2015-02-17 ENCOUNTER — Encounter (HOSPITAL_COMMUNITY): Payer: Self-pay | Admitting: Psychiatry

## 2015-02-17 VITALS — BP 134/74 | HR 88 | Ht 71.0 in | Wt 359.0 lb

## 2015-02-17 DIAGNOSIS — F419 Anxiety disorder, unspecified: Secondary | ICD-10-CM

## 2015-02-17 DIAGNOSIS — R52 Pain, unspecified: Secondary | ICD-10-CM

## 2015-02-17 DIAGNOSIS — F5105 Insomnia due to other mental disorder: Secondary | ICD-10-CM

## 2015-02-17 DIAGNOSIS — F4312 Post-traumatic stress disorder, chronic: Secondary | ICD-10-CM | POA: Diagnosis not present

## 2015-02-17 DIAGNOSIS — F329 Major depressive disorder, single episode, unspecified: Secondary | ICD-10-CM

## 2015-02-17 MED ORDER — TRAZODONE HCL 100 MG PO TABS: 100.0000 mg | ORAL_TABLET | Freq: Every day | ORAL | Status: DC

## 2015-02-17 MED ORDER — GABAPENTIN 300 MG PO CAPS: ORAL_CAPSULE | ORAL | Status: DC

## 2015-02-17 MED ORDER — ALPRAZOLAM 1 MG PO TABS: 1.0000 mg | ORAL_TABLET | Freq: Four times a day (QID) | ORAL | Status: DC

## 2015-02-17 MED ORDER — BENZTROPINE MESYLATE 0.5 MG PO TABS: 0.5000 mg | ORAL_TABLET | Freq: Two times a day (BID) | ORAL | Status: DC

## 2015-02-17 MED ORDER — DULOXETINE HCL 60 MG PO CPEP: 60.0000 mg | ORAL_CAPSULE | Freq: Two times a day (BID) | ORAL | Status: DC

## 2015-02-17 MED ORDER — BUPROPION HCL ER (XL) 150 MG PO TB24: 150.0000 mg | ORAL_TABLET | Freq: Two times a day (BID) | ORAL | Status: DC

## 2015-02-17 NOTE — Progress Notes (Signed)
[facility] MD Progress Note  02/17/2015 4:03 PM Greg Kemp  MRN:  [id number] Subjective: I've been having some bad days. The patient is wife return after 2 months. He states he's been fairly stable but at times it had really bad days and fleeting suicidal thoughts. He is even thought about going to the behavioral health hospital but the thoughts pass after little while and he feels better. He states that he will call me if things get bad again he can get readmitted even if it's to the intensive outpatient program. When he goes to these bad times he is angry at other people but not particular had himself. He's not sure why this is happening. He denies auditory or visual hallucinations or current suicidal ideation. He is able to smile a little bit today Principal Problem depression and PTSD Diagnosis:   Patient Active Problem List   Diagnosis Date Noted  . Memory difficulties [R41.3] 01/23/2013  . Insomnia due to mental disorder [F48.9, F51.05] 08/13/2012  . Anxiety [F41.9] 08/13/2012  . Pain [R52] 08/13/2012  . Bipolar 1 disorder [F31.9] 06/28/2010  . CHEST PAIN [R07.9] 06/28/2010  . IRRITABLE BOWEL SYNDROME [K58.9] 05/14/2009  . MELENA, HX OF [Z87.19] 02/23/2009  . HYPOTHYROIDISM [E03.9] 11/17/2008  . BRONCHITIS [J40] 11/17/2008  . GERD [K21.9] 11/17/2008  . NAUSEA AND VOMITING [R11.2] 11/17/2008  . Diarrhea [R19.7] 11/17/2008  . ABDOMINAL PAIN, LEFT LOWER QUADRANT [R10.32] 11/17/2008  . TONSILLECTOMY, HX OF [Z90.89] 11/17/2008   Total Time spent with patient: 20 minutes   Past Medical History:  Past Medical History  Diagnosis Date  . Thyroid disease   . Acid reflux   . Depression   . Anxiety   . Morbid obesity     Past Surgical History  Procedure Laterality Date  . Tonsillectomy    . Upper and lower gi     Family History:  Family History  Problem Relation Age of Onset  . Alcohol abuse Father   . Colon cancer Father   . Alcohol abuse Paternal Uncle   . Depression Daughter      acute  . Sexual abuse Daughter   . ADD / ADHD Neg Hx   . Anxiety disorder Neg Hx   . OCD Neg Hx   . Drug abuse Neg Hx   . Bipolar disorder Neg Hx   . Dementia Neg Hx   . Paranoid behavior Neg Hx   . Schizophrenia Neg Hx   . Seizures Neg Hx   . Physical abuse Neg Hx    Social History:  History  Alcohol Use  . 1.8 oz/week  . 3 Cans of beer per week     History  Drug Use No    History   Social History  . Marital Status: Married    Spouse Name: N/A  . Number of Children: N/A  . Years of Education: N/A   Social History Main Topics  . Smoking status: Never Smoker   . Smokeless tobacco: Never Used  . Alcohol Use: 1.8 oz/week    3 Cans of beer per week  . Drug Use: No  . Sexual Activity: Yes    Birth Control/ Protection: Surgical   Other Topics Concern  . None   Social History Narrative   Additional History:    Sleep: Fair  Appetite:  Good   Assessment:   Musculoskeletal: Strength & Muscle Tone: within normal limits Gait & Station: normal Patient leans: N/A   Psychiatric Specialty Exam: Physical Exam  ROS  Blood pressure 134/74, pulse 88, height 5\' 11"  (1.803 m), weight 162.841 kg (359 lb), SpO2 96 %.Body mass index is 50.09 kg/(m^2).  General Appearance: Casual and Well Groomed  Engineer, water::  Fair  Speech:  Clear and Coherent  Volume:  Decreased  Mood:  Dysphoric and Irritable  Affect:  Constricted, Depressed and Flat  Thought Process:  Goal Directed  Orientation:  Full (Time, Place, and Person)  Thought Content:  Rumination  Suicidal Thoughts:  No  Homicidal Thoughts:  No  Memory:  Immediate;   Fair Recent;   Poor Remote;   Poor  Judgement:  Impaired  Insight:  Fair  Psychomotor Activity:  Decreased  Concentration:  Poor  Recall:  Poor  Fund of Knowledge:Fair  Language: Good  Akathisia:  No  Handed:  Right  AIMS (if indicated):     Assets:  Communication Skills Desire for Improvement Resilience Social Support  ADL's:  Intact   Cognition: WNL  Sleep:        Current Medications: Current Outpatient Prescriptions  Medication Sig Dispense Refill  . ALPRAZolam (XANAX) 1 MG tablet Take 1 tablet (1 mg total) by mouth 4 (four) times daily. 480 tablet 1  . benztropine (COGENTIN) 0.5 MG tablet Take 1 tablet (0.5 mg total) by mouth 2 (two) times daily. 180 tablet 2  . buPROPion (WELLBUTRIN XL) 150 MG 24 hr tablet Take 1 tablet (150 mg total) by mouth 2 (two) times daily. 60 tablet 2  . DULoxetine (CYMBALTA) 60 MG capsule Take 1 capsule (60 mg total) by mouth 2 (two) times daily. 60 capsule 2  . furosemide (LASIX) 20 MG tablet Take 20 mg by mouth.    . gabapentin (NEURONTIN) 300 MG capsule Take by mouth 3 or 4 times a day 480 capsule 2  . levothyroxine (SYNTHROID, LEVOTHROID) 200 MCG tablet Take 125 mcg by mouth. Taking 1.5 Tablets Daily    . lurasidone (LATUDA) 80 MG TABS tablet Take one tablet daily with dinner 30 tablet 2  . Multiple Vitamin (MULTIVITAMIN) tablet Take 1 tablet by mouth daily.      . pantoprazole (PROTONIX) 40 MG tablet Take 40 mg by mouth daily.    . Potassium (POTASSIMIN PO) Take by mouth daily.    . traZODone (DESYREL) 100 MG tablet Take 1 tablet (100 mg total) by mouth at bedtime. 90 tablet 2   No current facility-administered medications for this visit.    Lab Results: No results found for this or any previous visit (from the past 48 hour(s)).  Physical Findings: AIMS:  , ,  ,  ,    CIWA:    COWS:     Treatment Plan Summary: Plan Patient will continue Cymbalta Wellbutrin for depression and Latuda for bipolar depression. He'll continue Xanax for anxiety and gabapentin for anxiety as well. He will call me if he feels worse and we'll arrange admission to the intensive outpatient program he will continue trazodone for sleep   Medical Decision Making:  Established Problem, Worsening (2), Review of Last Therapy Session (1), Review or order medicine tests (1) and Review of Medication Regimen & Side  Effects (2)     Guerline Happ, [facility] 02/17/2015, 4:03 PM

## 2015-02-19 ENCOUNTER — Ambulatory Visit (HOSPITAL_COMMUNITY): Payer: Self-pay | Admitting: Psychology

## 2015-03-16 ENCOUNTER — Ambulatory Visit (INDEPENDENT_AMBULATORY_CARE_PROVIDER_SITE_OTHER): Payer: Medicare HMO | Admitting: Psychology

## 2015-03-16 ENCOUNTER — Encounter (HOSPITAL_COMMUNITY): Payer: Self-pay | Admitting: Psychology

## 2015-03-16 DIAGNOSIS — F489 Nonpsychotic mental disorder, unspecified: Secondary | ICD-10-CM

## 2015-03-16 DIAGNOSIS — F332 Major depressive disorder, recurrent severe without psychotic features: Secondary | ICD-10-CM | POA: Diagnosis not present

## 2015-03-16 DIAGNOSIS — F5105 Insomnia due to other mental disorder: Secondary | ICD-10-CM | POA: Diagnosis not present

## 2015-03-16 DIAGNOSIS — F4312 Post-traumatic stress disorder, chronic: Secondary | ICD-10-CM
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
## 2015-03-31 ENCOUNTER — Ambulatory Visit (HOSPITAL_COMMUNITY): Payer: Self-pay | Admitting: Psychiatry

## 2015-04-15 ENCOUNTER — Ambulatory Visit (HOSPITAL_COMMUNITY): Payer: Self-pay | Admitting: Psychology

## 2015-05-25 ENCOUNTER — Ambulatory Visit (INDEPENDENT_AMBULATORY_CARE_PROVIDER_SITE_OTHER): Payer: Medicare HMO | Admitting: Psychology

## 2015-05-25 DIAGNOSIS — F489 Nonpsychotic mental disorder, unspecified: Secondary | ICD-10-CM | POA: Diagnosis not present

## 2015-05-25 DIAGNOSIS — F4312 Post-traumatic stress disorder, chronic: Secondary | ICD-10-CM

## 2015-05-25 DIAGNOSIS — F5105 Insomnia due to other mental disorder: Secondary | ICD-10-CM | POA: Diagnosis not present

## 2015-05-25 DIAGNOSIS — F332 Major depressive disorder, recurrent severe without psychotic features: Secondary | ICD-10-CM | POA: Diagnosis not present

## 2015-05-26 ENCOUNTER — Ambulatory Visit (INDEPENDENT_AMBULATORY_CARE_PROVIDER_SITE_OTHER): Payer: Commercial Managed Care - HMO | Admitting: Psychiatry

## 2015-05-26 ENCOUNTER — Encounter (HOSPITAL_COMMUNITY): Payer: Self-pay | Admitting: Psychiatry

## 2015-05-26 VITALS — BP 154/85 | HR 81 | Ht 71.0 in | Wt 356.0 lb

## 2015-05-26 DIAGNOSIS — R52 Pain, unspecified: Secondary | ICD-10-CM

## 2015-05-26 DIAGNOSIS — F333 Major depressive disorder, recurrent, severe with psychotic symptoms: Secondary | ICD-10-CM

## 2015-05-26 DIAGNOSIS — F419 Anxiety disorder, unspecified: Secondary | ICD-10-CM

## 2015-05-26 DIAGNOSIS — F5105 Insomnia due to other mental disorder: Secondary | ICD-10-CM

## 2015-05-26 MED ORDER — BENZTROPINE MESYLATE 0.5 MG PO TABS: 0.5000 mg | ORAL_TABLET | Freq: Two times a day (BID) | ORAL | Status: DC

## 2015-05-26 MED ORDER — DULOXETINE HCL 60 MG PO CPEP: 60.0000 mg | ORAL_CAPSULE | Freq: Two times a day (BID) | ORAL | Status: DC

## 2015-05-26 MED ORDER — BUPROPION HCL ER (XL) 150 MG PO TB24: 150.0000 mg | ORAL_TABLET | Freq: Two times a day (BID) | ORAL | Status: DC

## 2015-05-26 MED ORDER — TRAZODONE HCL 100 MG PO TABS: 100.0000 mg | ORAL_TABLET | Freq: Every day | ORAL | Status: DC

## 2015-05-26 MED ORDER — ALPRAZOLAM 1 MG PO TABS: 1.0000 mg | ORAL_TABLET | Freq: Four times a day (QID) | ORAL | Status: DC
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
## 2015-07-06 ENCOUNTER — Encounter (HOSPITAL_COMMUNITY): Payer: Self-pay | Admitting: Psychology
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
## 2015-07-26 ENCOUNTER — Ambulatory Visit (INDEPENDENT_AMBULATORY_CARE_PROVIDER_SITE_OTHER): Payer: Medicare HMO | Admitting: Psychology

## 2015-07-26 DIAGNOSIS — F332 Major depressive disorder, recurrent severe without psychotic features: Secondary | ICD-10-CM | POA: Diagnosis not present

## 2015-07-26 DIAGNOSIS — F4312 Post-traumatic stress disorder, chronic: Secondary | ICD-10-CM | POA: Diagnosis not present

## 2015-07-26 DIAGNOSIS — F419 Anxiety disorder, unspecified: Secondary | ICD-10-CM | POA: Diagnosis not present

## 2015-07-27 ENCOUNTER — Ambulatory Visit (INDEPENDENT_AMBULATORY_CARE_PROVIDER_SITE_OTHER): Payer: Medicare HMO | Admitting: Psychiatry

## 2015-07-27 ENCOUNTER — Encounter (HOSPITAL_COMMUNITY): Payer: Self-pay | Admitting: Psychiatry

## 2015-07-27 VITALS — BP 104/76 | HR 115 | Ht 71.0 in | Wt 362.0 lb

## 2015-07-27 DIAGNOSIS — F332 Major depressive disorder, recurrent severe without psychotic features: Secondary | ICD-10-CM | POA: Diagnosis not present

## 2015-07-27 DIAGNOSIS — F5105 Insomnia due to other mental disorder: Secondary | ICD-10-CM

## 2015-07-27 DIAGNOSIS — F419 Anxiety disorder, unspecified: Secondary | ICD-10-CM

## 2015-07-27 DIAGNOSIS — F4312 Post-traumatic stress disorder, chronic: Secondary | ICD-10-CM

## 2015-07-27 DIAGNOSIS — R52 Pain, unspecified: Secondary | ICD-10-CM

## 2015-07-27 MED ORDER — BENZTROPINE MESYLATE 0.5 MG PO TABS: 0.5000 mg | ORAL_TABLET | Freq: Two times a day (BID) | ORAL | Status: DC

## 2015-07-27 MED ORDER — TRAZODONE HCL 100 MG PO TABS: 100.0000 mg | ORAL_TABLET | Freq: Every day | ORAL | Status: DC

## 2015-07-27 MED ORDER — BUPROPION HCL ER (XL) 150 MG PO TB24: 150.0000 mg | ORAL_TABLET | Freq: Every day | ORAL | Status: DC

## 2015-07-27 MED ORDER — DULOXETINE HCL 60 MG PO CPEP: 60.0000 mg | ORAL_CAPSULE | Freq: Two times a day (BID) | ORAL | Status: DC
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
## 2015-08-25 ENCOUNTER — Ambulatory Visit (INDEPENDENT_AMBULATORY_CARE_PROVIDER_SITE_OTHER): Payer: Medicare HMO | Admitting: Psychology

## 2015-08-25 DIAGNOSIS — F4312 Post-traumatic stress disorder, chronic: Secondary | ICD-10-CM

## 2015-08-25 DIAGNOSIS — F489 Nonpsychotic mental disorder, unspecified: Secondary | ICD-10-CM | POA: Diagnosis not present

## 2015-08-25 DIAGNOSIS — F5105 Insomnia due to other mental disorder: Secondary | ICD-10-CM

## 2015-08-25 DIAGNOSIS — F332 Major depressive disorder, recurrent severe without psychotic features: Secondary | ICD-10-CM | POA: Diagnosis not present

## 2015-08-26 ENCOUNTER — Ambulatory Visit (HOSPITAL_COMMUNITY): Payer: Self-pay | Admitting: Psychology

## 2015-08-30 ENCOUNTER — Encounter (HOSPITAL_COMMUNITY): Payer: Self-pay | Admitting: Psychology

## 2015-08-30 ENCOUNTER — Ambulatory Visit (INDEPENDENT_AMBULATORY_CARE_PROVIDER_SITE_OTHER): Payer: Medicare HMO | Admitting: Psychiatry

## 2015-08-30 ENCOUNTER — Encounter (HOSPITAL_COMMUNITY): Payer: Self-pay | Admitting: Psychiatry

## 2015-08-30 VITALS — BP 146/94 | HR 95 | Ht 71.0 in | Wt 357.8 lb

## 2015-08-30 DIAGNOSIS — F332 Major depressive disorder, recurrent severe without psychotic features: Secondary | ICD-10-CM | POA: Diagnosis not present

## 2015-08-30 DIAGNOSIS — F419 Anxiety disorder, unspecified: Secondary | ICD-10-CM

## 2015-08-30 DIAGNOSIS — F5105 Insomnia due to other mental disorder: Secondary | ICD-10-CM

## 2015-08-30 DIAGNOSIS — R52 Pain, unspecified: Secondary | ICD-10-CM

## 2015-08-30 DIAGNOSIS — F489 Nonpsychotic mental disorder, unspecified: Secondary | ICD-10-CM

## 2015-08-30 MED ORDER — TRAZODONE HCL 100 MG PO TABS: 100.0000 mg | ORAL_TABLET | Freq: Every day | ORAL | Status: DC

## 2015-08-30 MED ORDER — BUPROPION HCL ER (XL) 150 MG PO TB24: 150.0000 mg | ORAL_TABLET | Freq: Every day | ORAL | Status: DC

## 2015-08-30 MED ORDER — DULOXETINE HCL 60 MG PO CPEP: 60.0000 mg | ORAL_CAPSULE | Freq: Two times a day (BID) | ORAL | Status: DC

## 2015-08-30 MED ORDER — LURASIDONE HCL 80 MG PO TABS: ORAL_TABLET | ORAL | Status: DC
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
## 2015-10-06 ENCOUNTER — Ambulatory Visit (INDEPENDENT_AMBULATORY_CARE_PROVIDER_SITE_OTHER): Payer: Medicare HMO | Admitting: Psychology

## 2015-10-06 DIAGNOSIS — F5105 Insomnia due to other mental disorder: Secondary | ICD-10-CM | POA: Diagnosis not present

## 2015-10-06 DIAGNOSIS — F419 Anxiety disorder, unspecified: Secondary | ICD-10-CM | POA: Diagnosis not present

## 2015-10-06 DIAGNOSIS — F332 Major depressive disorder, recurrent severe without psychotic features: Secondary | ICD-10-CM

## 2015-10-06 DIAGNOSIS — F489 Nonpsychotic mental disorder, unspecified: Secondary | ICD-10-CM | POA: Diagnosis not present

## 2015-10-07 ENCOUNTER — Encounter (HOSPITAL_COMMUNITY): Payer: Self-pay | Admitting: Psychology
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
## 2015-10-28 ENCOUNTER — Ambulatory Visit (HOSPITAL_COMMUNITY): Payer: Self-pay | Admitting: Psychiatry

## 2015-11-01 DIAGNOSIS — E782 Mixed hyperlipidemia: Secondary | ICD-10-CM | POA: Diagnosis not present

## 2015-11-01 DIAGNOSIS — E039 Hypothyroidism, unspecified: Secondary | ICD-10-CM | POA: Diagnosis not present

## 2015-11-01 DIAGNOSIS — E291 Testicular hypofunction: Secondary | ICD-10-CM | POA: Diagnosis not present

## 2015-11-02 DIAGNOSIS — F411 Generalized anxiety disorder: Secondary | ICD-10-CM | POA: Diagnosis not present

## 2015-11-02 DIAGNOSIS — F339 Major depressive disorder, recurrent, unspecified: Secondary | ICD-10-CM | POA: Diagnosis not present

## 2015-11-02 DIAGNOSIS — E039 Hypothyroidism, unspecified: Secondary | ICD-10-CM | POA: Diagnosis not present

## 2015-11-02 DIAGNOSIS — E6609 Other obesity due to excess calories: Secondary | ICD-10-CM | POA: Diagnosis not present

## 2015-11-19 ENCOUNTER — Ambulatory Visit (INDEPENDENT_AMBULATORY_CARE_PROVIDER_SITE_OTHER): Payer: Medicare HMO | Admitting: Psychiatry

## 2015-11-19 ENCOUNTER — Encounter (HOSPITAL_COMMUNITY): Payer: Self-pay | Admitting: Psychiatry

## 2015-11-19 VITALS — BP 134/101 | HR 84 | Ht 71.0 in | Wt 357.4 lb

## 2015-11-19 DIAGNOSIS — F489 Nonpsychotic mental disorder, unspecified: Secondary | ICD-10-CM

## 2015-11-19 DIAGNOSIS — F419 Anxiety disorder, unspecified: Secondary | ICD-10-CM | POA: Diagnosis not present

## 2015-11-19 DIAGNOSIS — F332 Major depressive disorder, recurrent severe without psychotic features: Secondary | ICD-10-CM

## 2015-11-19 DIAGNOSIS — R52 Pain, unspecified: Secondary | ICD-10-CM

## 2015-11-19 DIAGNOSIS — F5105 Insomnia due to other mental disorder: Secondary | ICD-10-CM

## 2015-11-19 MED ORDER — ALPRAZOLAM 1 MG PO TABS: 1.0000 mg | ORAL_TABLET | Freq: Four times a day (QID) | ORAL | Status: DC

## 2015-11-19 MED ORDER — BENZTROPINE MESYLATE 0.5 MG PO TABS: 0.5000 mg | ORAL_TABLET | Freq: Two times a day (BID) | ORAL | Status: DC

## 2015-11-19 MED ORDER — TRAZODONE HCL 100 MG PO TABS: 100.0000 mg | ORAL_TABLET | Freq: Every day | ORAL | Status: DC

## 2015-11-19 MED ORDER — DULOXETINE HCL 60 MG PO CPEP: 60.0000 mg | ORAL_CAPSULE | Freq: Two times a day (BID) | ORAL | Status: DC

## 2015-11-19 MED ORDER — GABAPENTIN 300 MG PO CAPS: ORAL_CAPSULE | ORAL | Status: DC

## 2015-11-19 MED ORDER — BUPROPION HCL ER (XL) 150 MG PO TB24: 150.0000 mg | ORAL_TABLET | Freq: Every day | ORAL | Status: DC
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
## 2015-11-24 ENCOUNTER — Ambulatory Visit (HOSPITAL_COMMUNITY): Payer: Self-pay | Admitting: Psychology

## 2015-12-14 DIAGNOSIS — E039 Hypothyroidism, unspecified: Secondary | ICD-10-CM | POA: Diagnosis not present

## 2015-12-17 ENCOUNTER — Encounter (HOSPITAL_COMMUNITY): Payer: Self-pay | Admitting: Psychiatry

## 2015-12-17 ENCOUNTER — Ambulatory Visit (INDEPENDENT_AMBULATORY_CARE_PROVIDER_SITE_OTHER): Payer: Medicare HMO | Admitting: Psychiatry

## 2015-12-17 VITALS — BP 144/86 | HR 69 | Ht 71.0 in | Wt 356.0 lb

## 2015-12-17 DIAGNOSIS — F332 Major depressive disorder, recurrent severe without psychotic features: Secondary | ICD-10-CM | POA: Diagnosis not present
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
## 2016-01-06 ENCOUNTER — Ambulatory Visit (INDEPENDENT_AMBULATORY_CARE_PROVIDER_SITE_OTHER): Payer: Medicare HMO | Admitting: Psychology

## 2016-01-06 ENCOUNTER — Encounter (HOSPITAL_COMMUNITY): Payer: Self-pay | Admitting: Psychology

## 2016-01-06 DIAGNOSIS — F5105 Insomnia due to other mental disorder: Secondary | ICD-10-CM | POA: Diagnosis not present

## 2016-01-06 DIAGNOSIS — F489 Nonpsychotic mental disorder, unspecified: Secondary | ICD-10-CM | POA: Diagnosis not present

## 2016-01-06 DIAGNOSIS — F419 Anxiety disorder, unspecified: Secondary | ICD-10-CM | POA: Diagnosis not present

## 2016-01-06 DIAGNOSIS — F332 Major depressive disorder, recurrent severe without psychotic features: Secondary | ICD-10-CM
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
## 2016-02-16 ENCOUNTER — Ambulatory Visit (INDEPENDENT_AMBULATORY_CARE_PROVIDER_SITE_OTHER): Payer: Medicare HMO | Admitting: Psychiatry

## 2016-02-16 ENCOUNTER — Encounter (HOSPITAL_COMMUNITY): Payer: Self-pay | Admitting: Psychiatry

## 2016-02-16 VITALS — BP 121/86 | HR 69 | Ht 71.0 in | Wt 352.0 lb

## 2016-02-16 DIAGNOSIS — F332 Major depressive disorder, recurrent severe without psychotic features: Secondary | ICD-10-CM | POA: Diagnosis not present

## 2016-02-16 MED ORDER — BUPROPION HCL ER (XL) 150 MG PO TB24: 150.0000 mg | ORAL_TABLET | Freq: Every day | ORAL | Status: DC

## 2016-02-16 MED ORDER — DULOXETINE HCL 60 MG PO CPEP: 60.0000 mg | ORAL_CAPSULE | Freq: Two times a day (BID) | ORAL | Status: DC
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
## 2016-02-17 ENCOUNTER — Ambulatory Visit (INDEPENDENT_AMBULATORY_CARE_PROVIDER_SITE_OTHER): Payer: Medicare HMO | Admitting: Psychology

## 2016-03-08 ENCOUNTER — Telehealth (HOSPITAL_COMMUNITY): Payer: Self-pay | Admitting: *Deleted

## 2016-03-17 ENCOUNTER — Ambulatory Visit (HOSPITAL_COMMUNITY): Payer: Self-pay | Admitting: Psychology

## 2016-03-22 ENCOUNTER — Ambulatory Visit (INDEPENDENT_AMBULATORY_CARE_PROVIDER_SITE_OTHER): Payer: Medicare HMO | Admitting: Psychiatry

## 2016-03-22 ENCOUNTER — Encounter (HOSPITAL_COMMUNITY): Payer: Self-pay | Admitting: Psychiatry

## 2016-03-22 VITALS — BP 135/79 | HR 74 | Ht 71.0 in | Wt 357.2 lb

## 2016-03-22 DIAGNOSIS — F332 Major depressive disorder, recurrent severe without psychotic features: Secondary | ICD-10-CM

## 2016-03-22 MED ORDER — ALPRAZOLAM 1 MG PO TABS: 1.0000 mg | ORAL_TABLET | Freq: Four times a day (QID) | ORAL | Status: DC

## 2016-03-22 MED ORDER — BUPROPION HCL ER (XL) 150 MG PO TB24: 150.0000 mg | ORAL_TABLET | Freq: Every day | ORAL | Status: DC

## 2016-03-22 MED ORDER — TRAZODONE HCL 100 MG PO TABS: 100.0000 mg | ORAL_TABLET | Freq: Every day | ORAL | Status: DC
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
## 2016-04-14 ENCOUNTER — Ambulatory Visit (HOSPITAL_COMMUNITY): Payer: Self-pay | Admitting: Psychiatry

## 2016-04-17 ENCOUNTER — Ambulatory Visit (HOSPITAL_COMMUNITY): Payer: Self-pay | Admitting: Psychiatry

## 2016-04-20 ENCOUNTER — Ambulatory Visit (INDEPENDENT_AMBULATORY_CARE_PROVIDER_SITE_OTHER): Payer: Medicare HMO | Admitting: Psychology

## 2016-04-20 DIAGNOSIS — F5105 Insomnia due to other mental disorder: Secondary | ICD-10-CM | POA: Diagnosis not present

## 2016-04-20 DIAGNOSIS — F489 Nonpsychotic mental disorder, unspecified: Secondary | ICD-10-CM | POA: Diagnosis not present

## 2016-04-20 DIAGNOSIS — F332 Major depressive disorder, recurrent severe without psychotic features: Secondary | ICD-10-CM

## 2016-04-20 DIAGNOSIS — F419 Anxiety disorder, unspecified: Secondary | ICD-10-CM

## 2016-04-21 ENCOUNTER — Encounter (HOSPITAL_COMMUNITY): Payer: Self-pay | Admitting: Psychology
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
## 2016-05-01 ENCOUNTER — Other Ambulatory Visit (HOSPITAL_COMMUNITY): Payer: Self-pay | Admitting: Adult Health Nurse Practitioner

## 2016-05-01 ENCOUNTER — Ambulatory Visit (HOSPITAL_COMMUNITY)
Admission: RE | Admit: 2016-05-01 | Discharge: 2016-05-01 | Disposition: A | Discharge status: Home / Self Care | Payer: Commercial Managed Care - HMO | Source: Ambulatory Visit | Attending: Adult Health Nurse Practitioner | Admitting: Adult Health Nurse Practitioner

## 2016-05-01 DIAGNOSIS — M25561 Pain in right knee: Secondary | ICD-10-CM

## 2016-05-01 DIAGNOSIS — M25562 Pain in left knee: Secondary | ICD-10-CM | POA: Diagnosis not present

## 2016-05-01 DIAGNOSIS — E039 Hypothyroidism, unspecified: Secondary | ICD-10-CM | POA: Diagnosis not present

## 2016-05-01 DIAGNOSIS — R937 Abnormal findings on diagnostic imaging of other parts of musculoskeletal system: Secondary | ICD-10-CM | POA: Insufficient documentation

## 2016-05-01 DIAGNOSIS — E291 Testicular hypofunction: Secondary | ICD-10-CM | POA: Diagnosis not present

## 2016-05-01 DIAGNOSIS — M179 Osteoarthritis of knee, unspecified: Secondary | ICD-10-CM | POA: Diagnosis not present

## 2016-05-01 DIAGNOSIS — R7301 Impaired fasting glucose: Secondary | ICD-10-CM | POA: Diagnosis not present

## 2016-05-01 DIAGNOSIS — E782 Mixed hyperlipidemia: Secondary | ICD-10-CM | POA: Diagnosis not present

## 2016-05-03 DIAGNOSIS — E039 Hypothyroidism, unspecified: Secondary | ICD-10-CM | POA: Diagnosis not present

## 2016-05-03 DIAGNOSIS — F339 Major depressive disorder, recurrent, unspecified: Secondary | ICD-10-CM | POA: Diagnosis not present

## 2016-05-03 DIAGNOSIS — M25569 Pain in unspecified knee: Secondary | ICD-10-CM | POA: Diagnosis not present

## 2016-05-03 DIAGNOSIS — E782 Mixed hyperlipidemia: Secondary | ICD-10-CM | POA: Diagnosis not present

## 2016-05-03 DIAGNOSIS — E6609 Other obesity due to excess calories: Secondary | ICD-10-CM | POA: Diagnosis not present

## 2016-05-22 ENCOUNTER — Ambulatory Visit (HOSPITAL_COMMUNITY): Payer: Self-pay | Admitting: Psychiatry

## 2016-05-22 ENCOUNTER — Encounter (HOSPITAL_COMMUNITY): Payer: Self-pay

## 2016-05-23 ENCOUNTER — Ambulatory Visit (HOSPITAL_COMMUNITY): Payer: Self-pay | Admitting: Psychology

## 2016-05-23 ENCOUNTER — Encounter (HOSPITAL_COMMUNITY): Payer: Self-pay | Admitting: Psychiatry

## 2016-05-29 ENCOUNTER — Ambulatory Visit (INDEPENDENT_AMBULATORY_CARE_PROVIDER_SITE_OTHER): Payer: Commercial Managed Care - HMO | Admitting: Psychology

## 2016-05-29 ENCOUNTER — Encounter (HOSPITAL_COMMUNITY): Payer: Self-pay | Admitting: Psychology

## 2016-05-29 DIAGNOSIS — F419 Anxiety disorder, unspecified: Secondary | ICD-10-CM | POA: Diagnosis not present

## 2016-05-29 DIAGNOSIS — F332 Major depressive disorder, recurrent severe without psychotic features: Secondary | ICD-10-CM

## 2016-05-29 DIAGNOSIS — F489 Nonpsychotic mental disorder, unspecified: Secondary | ICD-10-CM

## 2016-05-29 DIAGNOSIS — F4312 Post-traumatic stress disorder, chronic: Secondary | ICD-10-CM

## 2016-05-29 DIAGNOSIS — F5105 Insomnia due to other mental disorder: Secondary | ICD-10-CM
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
## 2016-05-31 ENCOUNTER — Ambulatory Visit (INDEPENDENT_AMBULATORY_CARE_PROVIDER_SITE_OTHER): Payer: Commercial Managed Care - HMO | Admitting: Psychiatry

## 2016-05-31 ENCOUNTER — Encounter (HOSPITAL_COMMUNITY): Payer: Self-pay | Admitting: Psychiatry

## 2016-05-31 VITALS — BP 164/84 | HR 68 | Ht 71.0 in | Wt 349.6 lb

## 2016-05-31 DIAGNOSIS — F4312 Post-traumatic stress disorder, chronic: Secondary | ICD-10-CM

## 2016-05-31 DIAGNOSIS — F332 Major depressive disorder, recurrent severe without psychotic features: Secondary | ICD-10-CM | POA: Diagnosis not present

## 2016-05-31 MED ORDER — TRAZODONE HCL 100 MG PO TABS: 100.0000 mg | ORAL_TABLET | Freq: Every day | ORAL | 2 refills | Status: DC

## 2016-05-31 MED ORDER — DULOXETINE HCL 60 MG PO CPEP: 60.0000 mg | ORAL_CAPSULE | Freq: Every day | ORAL | 2 refills | Status: DC

## 2016-05-31 MED ORDER — BUPROPION HCL ER (XL) 150 MG PO TB24: 150.0000 mg | ORAL_TABLET | Freq: Every day | ORAL | 2 refills | Status: DC

## 2016-05-31 MED ORDER — BENZTROPINE MESYLATE 0.5 MG PO TABS: 0.5000 mg | ORAL_TABLET | Freq: Two times a day (BID) | ORAL | 2 refills | Status: DC
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
## 2016-06-16 DIAGNOSIS — E039 Hypothyroidism, unspecified: Secondary | ICD-10-CM | POA: Diagnosis not present

## 2016-06-19 ENCOUNTER — Ambulatory Visit: Payer: Self-pay | Admitting: Orthopedic Surgery

## 2016-06-19 ENCOUNTER — Encounter: Payer: Self-pay | Admitting: Orthopedic Surgery

## 2016-06-27 ENCOUNTER — Ambulatory Visit (INDEPENDENT_AMBULATORY_CARE_PROVIDER_SITE_OTHER): Payer: Medicare HMO | Admitting: Orthopedic Surgery

## 2016-06-27 ENCOUNTER — Ambulatory Visit (INDEPENDENT_AMBULATORY_CARE_PROVIDER_SITE_OTHER): Payer: Self-pay

## 2016-06-27 ENCOUNTER — Encounter: Payer: Self-pay | Admitting: Orthopedic Surgery

## 2016-06-27 VITALS — BP 118/73 | HR 76 | Ht 71.75 in | Wt 337.0 lb

## 2016-06-27 DIAGNOSIS — M5441 Lumbago with sciatica, right side: Secondary | ICD-10-CM

## 2016-06-27 DIAGNOSIS — M899 Disorder of bone, unspecified: Secondary | ICD-10-CM

## 2016-06-27 DIAGNOSIS — M170 Bilateral primary osteoarthritis of knee: Secondary | ICD-10-CM

## 2016-06-27 DIAGNOSIS — M79605 Pain in left leg: Secondary | ICD-10-CM

## 2016-06-27 NOTE — Patient Instructions (Signed)
MRI  PHYSICAL THERAPY  FOLLOW UP AFTER MRI

## 2016-06-28 NOTE — Progress Notes (Addendum)
Chief Complaint  Patient presents with  . New Patient (Initial Visit)    Right knee pain for 1 year.  Has fell numerous times   HPI   55 year old male presents for evaluation of right knee pain 1 Year Giving Way symptoms. Patient reports no trauma. He initially denied any back pain. He says he can walk do things in chart and the leg will just give out. He does complain of some right hip pain.  Pain is dull aching appears to be nonradiating and feels it in his knee area. It is nonspecific in terms of medial lateral anterior or posterior  It is moderate in severity it is constant    Review of Systems  Constitutional: Negative for fever.  Respiratory: Negative for shortness of breath.   Cardiovascular: Negative for chest pain.  Psychiatric/Behavioral: Positive for depression.  All other systems reviewed and are negative.   Past Medical History:  Diagnosis Date  . Acid reflux   . Anxiety   . Depression   . Depression   . GERD (gastroesophageal reflux disease)   . Morbid obesity ([place])   . Thyroid disease     Past Surgical History:  Procedure Laterality Date  . TONSILLECTOMY    . Upper and Lower GI     Family History  Problem Relation Age of Onset  . Alcohol abuse Father   . Colon cancer Father   . COPD Father   . Depression Daughter     acute  . Sexual abuse Daughter   . Lung disease Mother   . COPD Mother   . Thyroid disease Mother   . Alcohol abuse Paternal Uncle   . ADD / ADHD Neg Hx   . Anxiety disorder Neg Hx   . OCD Neg Hx   . Drug abuse Neg Hx   . Bipolar disorder Neg Hx   . Dementia Neg Hx   . Paranoid behavior Neg Hx   . Schizophrenia Neg Hx   . Seizures Neg Hx   . Physical abuse Neg Hx    Social History  Substance Use Topics  . Smoking status: Never Smoker  . Smokeless tobacco: Never Used  . Alcohol use 1.8 oz/week    3 Cans of beer per week   Current Meds  Medication Sig  . ALPRAZolam (XANAX) 1 MG tablet Take 1 tablet (1 mg total) by mouth  4 (four) times daily.  . benztropine (COGENTIN) 0.5 MG tablet Take 1 tablet (0.5 mg total) by mouth 2 (two) times daily.  Marland Kitchen buPROPion (WELLBUTRIN XL) 150 MG 24 hr tablet Take 1 tablet (150 mg total) by mouth daily.  . DULoxetine (CYMBALTA) 60 MG capsule Take 1 capsule (60 mg total) by mouth daily.  . furosemide (LASIX) 20 MG tablet Take 20 mg by mouth.  . gabapentin (NEURONTIN) 300 MG capsule Take by mouth 3 or 4 times a day  . levothyroxine (SYNTHROID, LEVOTHROID) 200 MCG tablet Take 100 mcg by mouth daily before breakfast. Taking 1.5 Tablets Daily  . lurasidone (LATUDA) 80 MG TABS tablet Take one tablet daily with dinner  . pantoprazole (PROTONIX) 40 MG tablet Take 40 mg by mouth daily.  . Potassium (POTASSIMIN PO) Take by mouth daily.  . traZODone (DESYREL) 100 MG tablet Take 1 tablet (100 mg total) by mouth at bedtime.    BP 118/73   Pulse 76   Ht 5' 11.75" (1.822 m)   Wt (!) 337 lb (152.9 kg)   BMI 46.02 kg/m  Physical Exam  Constitutional: He is oriented to person, place, and time. He appears well-developed and well-nourished. No distress.  Cardiovascular: Normal rate and intact distal pulses.   Neurological: He is alert and oriented to person, place, and time.  Skin: Skin is warm and dry. No rash noted. He is not diaphoretic. No erythema. No pallor.  Psychiatric: He has a normal mood and affect. His behavior is normal. Judgment and thought content normal.    Ortho Exam  Right and left knee examination shows some slight flexion contracture with mild varus appearance. Both knees are stable varus valgus testing however he has laxity in the AP plane which appears to be posterior cruciate ligament laxity grade 1. Muscle strength and tone remains normal in each leg skin is intact without rash lesion or ulceration the right or left lower extremity has good distal pulses but mild edema in each leg. Sensation reveals no abnormalities to soft touch sharp touch or position since  He has  tenderness in his lower back especially on the right side straight leg raise is negative  The right and left upper extremity on inspection revealed no abnormalities to alignment. There is no swelling. His range of motion is full in the shoulder wrist and elbow and each right and left arm. All joints stable in both upper extremities. He has no weakness in the right or left arm. Skin is normal. Pulses are good at the radius and ulna in both hands and wrists there is no lymphadenopathy in the epitrochlear or axillary regions he has normal sensation in both hands   ASSESSMENT: My personal interpretation of the images:  Plain films back shows significant degenerative disc disease  He had right and left knee x-rays which did show osteoarthritis moderate medial compartment with secondary bone changes  He appears to have primarily lower back pain with giving way symptoms related to his lumbar disease  His knees show very minimal physiologic laxity in the PCL with minimal discomfort over the medial joint line with the arthritis is on x-ray  I will treat him for his lumbar disease  PLAN Start physical therapy  When reviewing his x-rays he had a tibial lesion which apparently looked somewhat suspect as the Of the lesion showed increased calcification  So we are getting an MRI is recommended by radiology left tibia with gadolinium-enhanced contrast   Encounter Diagnoses  Name Primary?  . Bilateral low back pain with right-sided sciatica Yes  . Left leg pain   . Primary osteoarthritis of both knees   . Bone lesion     Arther Abbott, MD 06/28/2016 12:01 PM  .meds

## 2016-06-29 ENCOUNTER — Encounter (HOSPITAL_COMMUNITY): Payer: Self-pay | Admitting: Psychology

## 2016-06-29 ENCOUNTER — Ambulatory Visit (INDEPENDENT_AMBULATORY_CARE_PROVIDER_SITE_OTHER): Payer: Commercial Managed Care - HMO | Admitting: Psychology

## 2016-06-29 DIAGNOSIS — F4312 Post-traumatic stress disorder, chronic: Secondary | ICD-10-CM

## 2016-06-29 DIAGNOSIS — F332 Major depressive disorder, recurrent severe without psychotic features: Secondary | ICD-10-CM
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
## 2016-07-05 ENCOUNTER — Ambulatory Visit (HOSPITAL_COMMUNITY)
Admission: RE | Admit: 2016-07-05 | Discharge: 2016-07-05 | Disposition: A | Discharge status: Home / Self Care | Payer: Commercial Managed Care - HMO | Source: Ambulatory Visit | Attending: Orthopedic Surgery | Admitting: Orthopedic Surgery

## 2016-07-05 DIAGNOSIS — M899 Disorder of bone, unspecified: Secondary | ICD-10-CM | POA: Diagnosis not present

## 2016-07-05 DIAGNOSIS — R609 Edema, unspecified: Secondary | ICD-10-CM | POA: Diagnosis not present

## 2016-07-05 DIAGNOSIS — M79605 Pain in left leg: Secondary | ICD-10-CM | POA: Diagnosis not present

## 2016-07-05 DIAGNOSIS — R600 Localized edema: Secondary | ICD-10-CM | POA: Diagnosis not present

## 2016-07-05 MED ORDER — GADOBENATE DIMEGLUMINE 529 MG/ML IV SOLN
20.0000 mL | Freq: Once | INTRAVENOUS | Status: AC | PRN
  Administered 2016-07-05: 20 mL via INTRAVENOUS

## 2016-07-06 ENCOUNTER — Ambulatory Visit: Payer: Self-pay | Admitting: Orthopedic Surgery

## 2016-07-14 ENCOUNTER — Ambulatory Visit (INDEPENDENT_AMBULATORY_CARE_PROVIDER_SITE_OTHER): Payer: Commercial Managed Care - HMO | Admitting: Orthopedic Surgery

## 2016-07-14 VITALS — BP 107/72 | HR 89 | Ht 71.5 in | Wt 336.0 lb

## 2016-07-14 DIAGNOSIS — M899 Disorder of bone, unspecified: Secondary | ICD-10-CM

## 2016-07-14 NOTE — Progress Notes (Signed)
Patient ID: Greg Kemp, male   DOB: [birth year], 55 y.o.   MRN: [id number]    Chief Complaint  Patient presents with  . Follow-up    MRI results of left tibia fibula.    BP 107/72   Pulse 89   Ht 5' 11.5" (1.816 m)   Wt (!) 336 lb (152.4 kg)   BMI 46.21 kg/m   MRI follow-up MRI was read as No other soft tissue abnormality.   IMPRESSION: 1. Benign pedunculated exostosis of the proximal tibia, probably impinging upon the segmental nerve that innervates the anterior fibers of the proximal medial head of the gastrocnemius. 2. Focal abnormal edema and enhancement of the anterior fibers of proximal medial head of the gastrocnemius. 3. No other significant abnormality     Electronically Signed   By: Lorriane Shire M.D.   On: 07/05/2016 17:10   Benign lesion left tib-fib does not need any further treatment  Patient will start PT for back and come back after his therapy is completed

## 2016-07-14 NOTE — Patient Instructions (Signed)
CALL AFTER THERAPY COMPLETED

## 2016-07-27 ENCOUNTER — Ambulatory Visit (INDEPENDENT_AMBULATORY_CARE_PROVIDER_SITE_OTHER): Payer: Commercial Managed Care - HMO | Admitting: Psychology

## 2016-07-27 ENCOUNTER — Encounter (HOSPITAL_COMMUNITY): Payer: Self-pay | Admitting: Psychology

## 2016-07-27 DIAGNOSIS — F4312 Post-traumatic stress disorder, chronic: Secondary | ICD-10-CM

## 2016-07-27 DIAGNOSIS — F332 Major depressive disorder, recurrent severe without psychotic features: Secondary | ICD-10-CM | POA: Diagnosis not present
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
## 2016-07-28 ENCOUNTER — Ambulatory Visit (HOSPITAL_COMMUNITY): Payer: Commercial Managed Care - HMO | Attending: Orthopedic Surgery | Admitting: Physical Therapy

## 2016-07-31 ENCOUNTER — Other Ambulatory Visit (HOSPITAL_COMMUNITY): Payer: Self-pay | Admitting: Psychiatry

## 2016-07-31 ENCOUNTER — Telehealth (HOSPITAL_COMMUNITY): Payer: Self-pay | Admitting: *Deleted

## 2016-07-31 ENCOUNTER — Encounter (HOSPITAL_COMMUNITY): Payer: Self-pay | Admitting: Psychiatry

## 2016-07-31 ENCOUNTER — Ambulatory Visit (INDEPENDENT_AMBULATORY_CARE_PROVIDER_SITE_OTHER): Payer: Commercial Managed Care - HMO | Admitting: Psychiatry

## 2016-07-31 ENCOUNTER — Encounter (INDEPENDENT_AMBULATORY_CARE_PROVIDER_SITE_OTHER): Payer: Self-pay

## 2016-07-31 VITALS — BP 131/115 | HR 66 | Ht 71.5 in | Wt 343.0 lb

## 2016-07-31 DIAGNOSIS — R52 Pain, unspecified: Secondary | ICD-10-CM

## 2016-07-31 DIAGNOSIS — F4312 Post-traumatic stress disorder, chronic: Secondary | ICD-10-CM | POA: Diagnosis not present

## 2016-07-31 DIAGNOSIS — F332 Major depressive disorder, recurrent severe without psychotic features: Secondary | ICD-10-CM

## 2016-07-31 DIAGNOSIS — F5105 Insomnia due to other mental disorder: Secondary | ICD-10-CM

## 2016-07-31 DIAGNOSIS — F419 Anxiety disorder, unspecified: Secondary | ICD-10-CM

## 2016-07-31 MED ORDER — BUPROPION HCL ER (XL) 150 MG PO TB24: 150.0000 mg | ORAL_TABLET | Freq: Every day | ORAL | 2 refills | Status: DC

## 2016-07-31 MED ORDER — ALPRAZOLAM 1 MG PO TABS: 1.0000 mg | ORAL_TABLET | Freq: Four times a day (QID) | ORAL | 1 refills | Status: DC

## 2016-07-31 MED ORDER — BENZTROPINE MESYLATE 0.5 MG PO TABS: 0.5000 mg | ORAL_TABLET | Freq: Two times a day (BID) | ORAL | 2 refills | Status: DC

## 2016-07-31 MED ORDER — TRAZODONE HCL 100 MG PO TABS: 100.0000 mg | ORAL_TABLET | Freq: Every day | ORAL | 2 refills | Status: DC

## 2016-07-31 MED ORDER — DULOXETINE HCL 60 MG PO CPEP: 60.0000 mg | ORAL_CAPSULE | Freq: Every day | ORAL | 2 refills | Status: DC

## 2016-07-31 MED ORDER — GABAPENTIN 300 MG PO CAPS: ORAL_CAPSULE | ORAL | 2 refills | Status: DC

## 2016-07-31 NOTE — Telephone Encounter (Signed)
patient's wife is back in office, said she do not have the script for Xanax.

## 2016-07-31 NOTE — Telephone Encounter (Signed)
Script printed.
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
## 2016-08-01 NOTE — Telephone Encounter (Signed)
Script was given to pt wife

## 2016-08-28 ENCOUNTER — Ambulatory Visit (HOSPITAL_COMMUNITY): Payer: Self-pay | Admitting: Psychology

## 2016-09-14 ENCOUNTER — Telehealth (HOSPITAL_COMMUNITY): Payer: Self-pay | Admitting: *Deleted

## 2016-09-14 NOTE — Telephone Encounter (Signed)
voice message from patient's wife, said he is out of [place].  he need Latuda samples.

## 2016-09-14 NOTE — Telephone Encounter (Signed)
Called pt wife and informed her pt samples are ready for pick up

## 2016-09-14 NOTE — Telephone Encounter (Signed)
voice message from patient's wife, said he is out of [place] and would like more samples

## 2016-09-14 NOTE — Telephone Encounter (Signed)
Samples provided 

## 2016-09-15 ENCOUNTER — Encounter (HOSPITAL_COMMUNITY): Payer: Self-pay | Admitting: *Deleted

## 2016-09-15 NOTE — Progress Notes (Signed)
Pt wife came to office to pick up samples for pt Latuda 80 mg. Pt wife D/L number is [id number] exp date is 03-06-17. LOT number is [id number] exp 02-2020 4 packs.

## 2016-09-21 ENCOUNTER — Ambulatory Visit (INDEPENDENT_AMBULATORY_CARE_PROVIDER_SITE_OTHER): Payer: Commercial Managed Care - HMO | Admitting: Psychology

## 2016-09-21 DIAGNOSIS — F332 Major depressive disorder, recurrent severe without psychotic features: Secondary | ICD-10-CM

## 2016-09-21 DIAGNOSIS — F4312 Post-traumatic stress disorder, chronic: Secondary | ICD-10-CM

## 2016-10-16 ENCOUNTER — Telehealth (HOSPITAL_COMMUNITY): Payer: Self-pay | Admitting: *Deleted

## 2016-10-16 NOTE — Telephone Encounter (Signed)
phone call from patient's wife, he ran out of his sample for Latuda yesterday.   He need more samples.

## 2016-10-18 ENCOUNTER — Ambulatory Visit (HOSPITAL_COMMUNITY): Payer: Self-pay | Admitting: Psychology

## 2016-10-18 NOTE — Telephone Encounter (Signed)
Spoke with pt wife and she will try to print up patient assistance for pt Latuda and see if he could get approved for financial assistance for 2018. Per pt wife she will call [employer] and see how much they are going to charge them for pt Latuda for this year per month. Per pt she will also check on getting on getting more appt time slots approved with [facility] for pt to see Dr. Harrington Challenger for 2018.

## 2016-10-18 NOTE — Telephone Encounter (Signed)
phone call from patient's wife, he ran out of his sample for Latuda yesterday. Per pt wife, pt need more samples.
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
## 2016-10-24 ENCOUNTER — Encounter (HOSPITAL_COMMUNITY): Payer: Self-pay | Admitting: *Deleted

## 2016-10-24 ENCOUNTER — Ambulatory Visit (INDEPENDENT_AMBULATORY_CARE_PROVIDER_SITE_OTHER): Payer: Commercial Managed Care - HMO | Admitting: Psychology

## 2016-10-24 DIAGNOSIS — F4312 Post-traumatic stress disorder, chronic: Secondary | ICD-10-CM | POA: Diagnosis not present

## 2016-10-24 DIAGNOSIS — F332 Major depressive disorder, recurrent severe without psychotic features: Secondary | ICD-10-CM | POA: Diagnosis not present

## 2016-10-24 NOTE — Progress Notes (Signed)
Pt came into office to pick up Latuda samples for 80 mg. LOT number is [id number] and exp date is 02-2020. Pt d/L number is [id number] with expiration date of 05-15-2017. Pt received 4 packs.
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
## 2016-10-31 ENCOUNTER — Ambulatory Visit (HOSPITAL_COMMUNITY): Payer: Self-pay | Admitting: Psychiatry

## 2016-11-09 ENCOUNTER — Encounter (HOSPITAL_COMMUNITY): Payer: Self-pay | Admitting: Psychiatry

## 2016-11-09 ENCOUNTER — Ambulatory Visit (INDEPENDENT_AMBULATORY_CARE_PROVIDER_SITE_OTHER): Payer: Commercial Managed Care - HMO | Admitting: Psychiatry

## 2016-11-09 VITALS — BP 139/80 | HR 75 | Ht 71.5 in | Wt 335.0 lb

## 2016-11-09 DIAGNOSIS — F332 Major depressive disorder, recurrent severe without psychotic features: Secondary | ICD-10-CM | POA: Diagnosis not present

## 2016-11-09 DIAGNOSIS — F419 Anxiety disorder, unspecified: Secondary | ICD-10-CM

## 2016-11-09 DIAGNOSIS — F5105 Insomnia due to other mental disorder: Secondary | ICD-10-CM

## 2016-11-09 DIAGNOSIS — R52 Pain, unspecified: Secondary | ICD-10-CM

## 2016-11-09 DIAGNOSIS — Z888 Allergy status to other drugs, medicaments and biological substances status: Secondary | ICD-10-CM

## 2016-11-09 DIAGNOSIS — Z9889 Other specified postprocedural states: Secondary | ICD-10-CM

## 2016-11-09 MED ORDER — BUPROPION HCL ER (XL) 150 MG PO TB24: 150.0000 mg | ORAL_TABLET | Freq: Every day | ORAL | 2 refills | Status: DC

## 2016-11-09 MED ORDER — TRAZODONE HCL 100 MG PO TABS: 100.0000 mg | ORAL_TABLET | Freq: Every day | ORAL | 2 refills | Status: DC

## 2016-11-09 MED ORDER — GABAPENTIN 300 MG PO CAPS: ORAL_CAPSULE | ORAL | 2 refills | Status: DC

## 2016-11-09 MED ORDER — DULOXETINE HCL 60 MG PO CPEP: 60.0000 mg | ORAL_CAPSULE | Freq: Every day | ORAL | 2 refills | Status: DC

## 2016-11-09 MED ORDER — BENZTROPINE MESYLATE 0.5 MG PO TABS: 0.5000 mg | ORAL_TABLET | Freq: Two times a day (BID) | ORAL | 2 refills | Status: DC

## 2016-11-09 MED ORDER — ALPRAZOLAM 1 MG PO TABS: 1.0000 mg | ORAL_TABLET | Freq: Four times a day (QID) | ORAL | 1 refills | Status: DC
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
## 2016-11-15 DIAGNOSIS — E039 Hypothyroidism, unspecified: Secondary | ICD-10-CM | POA: Diagnosis not present

## 2016-11-15 DIAGNOSIS — R7301 Impaired fasting glucose: Secondary | ICD-10-CM | POA: Diagnosis not present

## 2016-11-15 DIAGNOSIS — E782 Mixed hyperlipidemia: Secondary | ICD-10-CM | POA: Diagnosis not present

## 2016-11-17 DIAGNOSIS — Z0001 Encounter for general adult medical examination with abnormal findings: Secondary | ICD-10-CM | POA: Diagnosis not present

## 2016-11-17 DIAGNOSIS — R296 Repeated falls: Secondary | ICD-10-CM | POA: Diagnosis not present

## 2016-11-17 DIAGNOSIS — E782 Mixed hyperlipidemia: Secondary | ICD-10-CM | POA: Diagnosis not present

## 2016-11-17 DIAGNOSIS — E039 Hypothyroidism, unspecified: Secondary | ICD-10-CM | POA: Diagnosis not present

## 2016-11-17 DIAGNOSIS — K5900 Constipation, unspecified: Secondary | ICD-10-CM | POA: Diagnosis not present

## 2016-11-17 DIAGNOSIS — F339 Major depressive disorder, recurrent, unspecified: Secondary | ICD-10-CM | POA: Diagnosis not present

## 2016-11-17 DIAGNOSIS — R944 Abnormal results of kidney function studies: Secondary | ICD-10-CM | POA: Diagnosis not present

## 2016-11-17 DIAGNOSIS — M25569 Pain in unspecified knee: Secondary | ICD-10-CM | POA: Diagnosis not present

## 2016-11-17 DIAGNOSIS — Z6841 Body Mass Index (BMI) 40.0 and over, adult: Secondary | ICD-10-CM | POA: Diagnosis not present

## 2017-01-11 ENCOUNTER — Telehealth (HOSPITAL_COMMUNITY): Payer: Self-pay | Admitting: *Deleted

## 2017-01-11 NOTE — Telephone Encounter (Signed)
voice message from patient's wife, said he need samples of Latuda.  Please have Octavia call her.

## 2017-01-15 ENCOUNTER — Encounter (HOSPITAL_COMMUNITY): Payer: Self-pay | Admitting: *Deleted

## 2017-01-15 NOTE — Telephone Encounter (Signed)
Spoke with pt wife and she stated she would like to come by office pt pick up samples for Latuda. Informed provider with informed and she agreed to have wife pick up pt samples for his Latuda. Informed pt wife she could come by office to pick up samples and she verbalized understanding.

## 2017-01-15 NOTE — Progress Notes (Signed)
Pt wife came into office to pick up samples for pt Latuda 80 mg. Due to office being out of 80 mg, office provided with with 2 box of 20 mg and in each box, there are 28 tablets. Office also provided pt wife with 2 box of 60 mg which also contains 28 tablets. Staff informed pt wife to combine the 60 mg and the 20 mg for pt to get his prescribed dose. Pt wife agreed and verbalized understating. LOT number for 20 mg Anette Guarneri is [id number] P and exp 07-2020 for both boxes. LOT number for 60 mg Latuda is [id number] and expiration date is 04-2020 for both boxes. Pt wife name is Maxden Naji and D/L number is [id number] and exp is 03-06-2017.

## 2017-01-30 ENCOUNTER — Ambulatory Visit (INDEPENDENT_AMBULATORY_CARE_PROVIDER_SITE_OTHER): Payer: Commercial Managed Care - HMO | Admitting: Psychiatry

## 2017-01-30 ENCOUNTER — Encounter (HOSPITAL_COMMUNITY): Payer: Self-pay | Admitting: Psychiatry

## 2017-01-30 VITALS — BP 122/80 | HR 62 | Ht 71.5 in | Wt 334.8 lb

## 2017-01-30 DIAGNOSIS — N200 Calculus of kidney: Secondary | ICD-10-CM | POA: Diagnosis not present

## 2017-01-30 DIAGNOSIS — F332 Major depressive disorder, recurrent severe without psychotic features: Secondary | ICD-10-CM | POA: Diagnosis not present

## 2017-01-30 DIAGNOSIS — Z79899 Other long term (current) drug therapy: Secondary | ICD-10-CM | POA: Diagnosis not present

## 2017-01-30 DIAGNOSIS — Z6841 Body Mass Index (BMI) 40.0 and over, adult: Secondary | ICD-10-CM | POA: Diagnosis not present

## 2017-01-30 MED ORDER — DULOXETINE HCL 60 MG PO CPEP: 60.0000 mg | ORAL_CAPSULE | Freq: Every day | ORAL | 2 refills | Status: DC

## 2017-01-30 MED ORDER — BENZTROPINE MESYLATE 0.5 MG PO TABS: 0.5000 mg | ORAL_TABLET | Freq: Two times a day (BID) | ORAL | 2 refills | Status: DC

## 2017-01-30 MED ORDER — TRAZODONE HCL 100 MG PO TABS: 100.0000 mg | ORAL_TABLET | Freq: Every day | ORAL | 2 refills | Status: DC
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
## 2017-02-26 DIAGNOSIS — Z0000 Encounter for general adult medical examination without abnormal findings: Secondary | ICD-10-CM | POA: Diagnosis not present

## 2017-02-26 DIAGNOSIS — E039 Hypothyroidism, unspecified: Secondary | ICD-10-CM | POA: Diagnosis not present

## 2017-02-26 DIAGNOSIS — Z6841 Body Mass Index (BMI) 40.0 and over, adult: Secondary | ICD-10-CM | POA: Diagnosis not present

## 2017-03-29 ENCOUNTER — Ambulatory Visit (HOSPITAL_COMMUNITY): Payer: Self-pay | Admitting: Licensed Clinical Social Worker

## 2017-04-02 ENCOUNTER — Encounter (HOSPITAL_COMMUNITY): Payer: Self-pay | Admitting: Licensed Clinical Social Worker

## 2017-04-02 ENCOUNTER — Ambulatory Visit (INDEPENDENT_AMBULATORY_CARE_PROVIDER_SITE_OTHER): Payer: Commercial Managed Care - HMO | Admitting: Licensed Clinical Social Worker

## 2017-04-02 ENCOUNTER — Telehealth (HOSPITAL_COMMUNITY): Payer: Self-pay | Admitting: *Deleted

## 2017-04-02 DIAGNOSIS — F332 Major depressive disorder, recurrent severe without psychotic features: Secondary | ICD-10-CM | POA: Diagnosis not present

## 2017-04-02 NOTE — Telephone Encounter (Signed)
Pt came into office to see another provider today. Per pt and his wife, he is needing samples for his Latuda 80 mg. Pt is Dr. Harrington Challenger pt. Pt f/u appt is 04-30-2017. Pt wife number is [phone].
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
## 2017-04-02 NOTE — Telephone Encounter (Signed)
We can provide the same dose of samples for 30 days

## 2017-04-03 NOTE — Telephone Encounter (Signed)
Spoke with pt wife and she stated she will come by office tomorrow 04-04-2017.

## 2017-04-16 ENCOUNTER — Telehealth (HOSPITAL_COMMUNITY): Payer: Self-pay | Admitting: *Deleted

## 2017-04-16 NOTE — Telephone Encounter (Signed)
Pt wife came into office to pick up samples per previous phone call for pt Latuda 80 mg. Pt wife D/L number is [phone] with Expiration date 03-06-25. Latuda 80 mg LOT [id number] with exp date of 10/21. 4 cards of 7 tablets each was given to pt wife.

## 2017-04-30 ENCOUNTER — Encounter (HOSPITAL_COMMUNITY): Payer: Self-pay | Admitting: Psychiatry

## 2017-04-30 ENCOUNTER — Ambulatory Visit (INDEPENDENT_AMBULATORY_CARE_PROVIDER_SITE_OTHER): Payer: Commercial Managed Care - HMO | Admitting: Psychiatry

## 2017-04-30 VITALS — BP 138/90 | HR 65 | Ht 71.5 in | Wt 340.0 lb

## 2017-04-30 DIAGNOSIS — E669 Obesity, unspecified: Secondary | ICD-10-CM | POA: Diagnosis not present

## 2017-04-30 DIAGNOSIS — E039 Hypothyroidism, unspecified: Secondary | ICD-10-CM

## 2017-04-30 DIAGNOSIS — R52 Pain, unspecified: Secondary | ICD-10-CM | POA: Diagnosis not present

## 2017-04-30 DIAGNOSIS — F419 Anxiety disorder, unspecified: Secondary | ICD-10-CM | POA: Diagnosis not present

## 2017-04-30 DIAGNOSIS — Z6841 Body Mass Index (BMI) 40.0 and over, adult: Secondary | ICD-10-CM | POA: Diagnosis not present

## 2017-04-30 DIAGNOSIS — K219 Gastro-esophageal reflux disease without esophagitis: Secondary | ICD-10-CM | POA: Diagnosis not present

## 2017-04-30 DIAGNOSIS — F5105 Insomnia due to other mental disorder: Secondary | ICD-10-CM

## 2017-04-30 DIAGNOSIS — F332 Major depressive disorder, recurrent severe without psychotic features: Secondary | ICD-10-CM | POA: Diagnosis not present

## 2017-04-30 MED ORDER — ALPRAZOLAM 1 MG PO TABS: 1.0000 mg | ORAL_TABLET | Freq: Four times a day (QID) | ORAL | 1 refills | Status: DC

## 2017-04-30 MED ORDER — DULOXETINE HCL 60 MG PO CPEP: 60.0000 mg | ORAL_CAPSULE | Freq: Every day | ORAL | 2 refills | Status: DC

## 2017-04-30 MED ORDER — TRAZODONE HCL 100 MG PO TABS: 100.0000 mg | ORAL_TABLET | Freq: Every day | ORAL | 2 refills | Status: DC

## 2017-04-30 MED ORDER — BENZTROPINE MESYLATE 0.5 MG PO TABS: 0.5000 mg | ORAL_TABLET | Freq: Two times a day (BID) | ORAL | 2 refills | Status: DC
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
## 2017-05-02 ENCOUNTER — Ambulatory Visit (HOSPITAL_COMMUNITY): Payer: Self-pay | Admitting: Licensed Clinical Social Worker

## 2017-05-21 ENCOUNTER — Ambulatory Visit (HOSPITAL_COMMUNITY): Payer: Medicare HMO | Admitting: Licensed Clinical Social Worker

## 2017-05-29 ENCOUNTER — Ambulatory Visit (INDEPENDENT_AMBULATORY_CARE_PROVIDER_SITE_OTHER): Payer: Medicare HMO | Admitting: Licensed Clinical Social Worker

## 2017-05-29 DIAGNOSIS — F332 Major depressive disorder, recurrent severe without psychotic features: Secondary | ICD-10-CM | POA: Diagnosis not present
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
## 2017-05-30 DIAGNOSIS — R7301 Impaired fasting glucose: Secondary | ICD-10-CM | POA: Diagnosis not present

## 2017-05-30 DIAGNOSIS — Z125 Encounter for screening for malignant neoplasm of prostate: Secondary | ICD-10-CM | POA: Diagnosis not present

## 2017-05-30 DIAGNOSIS — E039 Hypothyroidism, unspecified: Secondary | ICD-10-CM | POA: Diagnosis not present

## 2017-05-30 DIAGNOSIS — I10 Essential (primary) hypertension: Secondary | ICD-10-CM | POA: Diagnosis not present

## 2017-05-30 DIAGNOSIS — E291 Testicular hypofunction: Secondary | ICD-10-CM | POA: Diagnosis not present

## 2017-05-30 DIAGNOSIS — Z1159 Encounter for screening for other viral diseases: Secondary | ICD-10-CM | POA: Diagnosis not present

## 2017-06-01 DIAGNOSIS — E782 Mixed hyperlipidemia: Secondary | ICD-10-CM | POA: Diagnosis not present

## 2017-06-01 DIAGNOSIS — K5900 Constipation, unspecified: Secondary | ICD-10-CM | POA: Diagnosis not present

## 2017-06-01 DIAGNOSIS — F419 Anxiety disorder, unspecified: Secondary | ICD-10-CM | POA: Diagnosis not present

## 2017-06-01 DIAGNOSIS — M25569 Pain in unspecified knee: Secondary | ICD-10-CM | POA: Diagnosis not present

## 2017-06-01 DIAGNOSIS — Z6841 Body Mass Index (BMI) 40.0 and over, adult: Secondary | ICD-10-CM | POA: Diagnosis not present

## 2017-06-01 DIAGNOSIS — R944 Abnormal results of kidney function studies: Secondary | ICD-10-CM | POA: Diagnosis not present

## 2017-06-01 DIAGNOSIS — F339 Major depressive disorder, recurrent, unspecified: Secondary | ICD-10-CM | POA: Diagnosis not present

## 2017-06-01 DIAGNOSIS — E039 Hypothyroidism, unspecified: Secondary | ICD-10-CM | POA: Diagnosis not present

## 2017-07-24 ENCOUNTER — Ambulatory Visit (INDEPENDENT_AMBULATORY_CARE_PROVIDER_SITE_OTHER): Payer: Medicare HMO | Admitting: Psychiatry

## 2017-07-24 ENCOUNTER — Encounter (HOSPITAL_COMMUNITY): Payer: Self-pay | Admitting: Psychiatry

## 2017-07-24 VITALS — BP 147/78 | HR 73 | Ht 71.5 in | Wt 340.0 lb

## 2017-07-24 DIAGNOSIS — Z598 Other problems related to housing and economic circumstances: Secondary | ICD-10-CM

## 2017-07-24 DIAGNOSIS — F5105 Insomnia due to other mental disorder: Secondary | ICD-10-CM | POA: Diagnosis not present

## 2017-07-24 DIAGNOSIS — R413 Other amnesia: Secondary | ICD-10-CM

## 2017-07-24 DIAGNOSIS — F515 Nightmare disorder: Secondary | ICD-10-CM | POA: Diagnosis not present

## 2017-07-24 DIAGNOSIS — Z560 Unemployment, unspecified: Secondary | ICD-10-CM

## 2017-07-24 DIAGNOSIS — F332 Major depressive disorder, recurrent severe without psychotic features: Secondary | ICD-10-CM

## 2017-07-24 DIAGNOSIS — Z811 Family history of alcohol abuse and dependence: Secondary | ICD-10-CM

## 2017-07-24 DIAGNOSIS — F419 Anxiety disorder, unspecified: Secondary | ICD-10-CM | POA: Diagnosis not present

## 2017-07-24 DIAGNOSIS — Z62810 Personal history of physical and sexual abuse in childhood: Secondary | ICD-10-CM | POA: Diagnosis not present

## 2017-07-24 DIAGNOSIS — R52 Pain, unspecified: Secondary | ICD-10-CM | POA: Diagnosis not present

## 2017-07-24 DIAGNOSIS — Z62811 Personal history of psychological abuse in childhood: Secondary | ICD-10-CM

## 2017-07-24 DIAGNOSIS — Z736 Limitation of activities due to disability: Secondary | ICD-10-CM

## 2017-07-24 DIAGNOSIS — Z634 Disappearance and death of family member: Secondary | ICD-10-CM | POA: Diagnosis not present

## 2017-07-24 DIAGNOSIS — Z6841 Body Mass Index (BMI) 40.0 and over, adult: Secondary | ICD-10-CM

## 2017-07-24 DIAGNOSIS — E0590 Thyrotoxicosis, unspecified without thyrotoxic crisis or storm: Secondary | ICD-10-CM

## 2017-07-24 DIAGNOSIS — Z818 Family history of other mental and behavioral disorders: Secondary | ICD-10-CM

## 2017-07-24 DIAGNOSIS — K219 Gastro-esophageal reflux disease without esophagitis: Secondary | ICD-10-CM

## 2017-07-24 MED ORDER — BENZTROPINE MESYLATE 0.5 MG PO TABS: 0.5000 mg | ORAL_TABLET | Freq: Two times a day (BID) | ORAL | 2 refills | Status: DC

## 2017-07-24 MED ORDER — ALPRAZOLAM 1 MG PO TABS: 1.0000 mg | ORAL_TABLET | Freq: Four times a day (QID) | ORAL | 1 refills | Status: DC

## 2017-07-24 MED ORDER — TRAZODONE HCL 100 MG PO TABS: 100.0000 mg | ORAL_TABLET | Freq: Every day | ORAL | 2 refills | Status: DC

## 2017-07-24 MED ORDER — DULOXETINE HCL 60 MG PO CPEP: 60.0000 mg | ORAL_CAPSULE | Freq: Every day | ORAL | 2 refills | Status: DC
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
## 2017-07-27 ENCOUNTER — Ambulatory Visit (INDEPENDENT_AMBULATORY_CARE_PROVIDER_SITE_OTHER): Payer: Medicare HMO | Admitting: Licensed Clinical Social Worker

## 2017-07-27 DIAGNOSIS — F332 Major depressive disorder, recurrent severe without psychotic features: Secondary | ICD-10-CM | POA: Diagnosis not present
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
## 2017-09-07 DIAGNOSIS — Z0000 Encounter for general adult medical examination without abnormal findings: Secondary | ICD-10-CM | POA: Diagnosis not present

## 2017-09-22 DIAGNOSIS — M25569 Pain in unspecified knee: Secondary | ICD-10-CM | POA: Diagnosis not present

## 2017-09-22 DIAGNOSIS — Z23 Encounter for immunization: Secondary | ICD-10-CM | POA: Diagnosis not present

## 2017-09-22 DIAGNOSIS — E039 Hypothyroidism, unspecified: Secondary | ICD-10-CM | POA: Diagnosis not present

## 2017-09-22 DIAGNOSIS — R944 Abnormal results of kidney function studies: Secondary | ICD-10-CM | POA: Diagnosis not present

## 2017-09-22 DIAGNOSIS — K219 Gastro-esophageal reflux disease without esophagitis: Secondary | ICD-10-CM | POA: Diagnosis not present

## 2017-09-22 DIAGNOSIS — R7301 Impaired fasting glucose: Secondary | ICD-10-CM | POA: Diagnosis not present

## 2017-09-22 DIAGNOSIS — E782 Mixed hyperlipidemia: Secondary | ICD-10-CM | POA: Diagnosis not present

## 2017-09-22 DIAGNOSIS — F419 Anxiety disorder, unspecified: Secondary | ICD-10-CM | POA: Diagnosis not present

## 2017-09-22 DIAGNOSIS — G3184 Mild cognitive impairment, so stated: Secondary | ICD-10-CM | POA: Diagnosis not present

## 2017-09-24 ENCOUNTER — Encounter (HOSPITAL_COMMUNITY): Payer: Self-pay | Admitting: Licensed Clinical Social Worker

## 2017-09-24 ENCOUNTER — Ambulatory Visit (HOSPITAL_COMMUNITY): Payer: Medicare HMO | Admitting: Licensed Clinical Social Worker

## 2017-09-24 DIAGNOSIS — F332 Major depressive disorder, recurrent severe without psychotic features: Secondary | ICD-10-CM
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
## 2017-10-22 ENCOUNTER — Encounter (HOSPITAL_COMMUNITY): Payer: Self-pay | Admitting: Psychiatry

## 2017-10-22 ENCOUNTER — Ambulatory Visit (INDEPENDENT_AMBULATORY_CARE_PROVIDER_SITE_OTHER): Payer: Medicare HMO | Admitting: Psychiatry

## 2017-10-22 VITALS — BP 126/80 | HR 87 | Ht 71.5 in | Wt 334.0 lb

## 2017-10-22 DIAGNOSIS — Z818 Family history of other mental and behavioral disorders: Secondary | ICD-10-CM | POA: Diagnosis not present

## 2017-10-22 DIAGNOSIS — F4310 Post-traumatic stress disorder, unspecified: Secondary | ICD-10-CM | POA: Diagnosis not present

## 2017-10-22 DIAGNOSIS — Z811 Family history of alcohol abuse and dependence: Secondary | ICD-10-CM | POA: Diagnosis not present

## 2017-10-22 DIAGNOSIS — Z560 Unemployment, unspecified: Secondary | ICD-10-CM

## 2017-10-22 DIAGNOSIS — F332 Major depressive disorder, recurrent severe without psychotic features: Secondary | ICD-10-CM

## 2017-10-22 DIAGNOSIS — Z62811 Personal history of psychological abuse in childhood: Secondary | ICD-10-CM | POA: Diagnosis not present

## 2017-10-22 DIAGNOSIS — F419 Anxiety disorder, unspecified: Secondary | ICD-10-CM

## 2017-10-22 DIAGNOSIS — R450 Nervousness: Secondary | ICD-10-CM

## 2017-10-22 DIAGNOSIS — Z736 Limitation of activities due to disability: Secondary | ICD-10-CM

## 2017-10-22 DIAGNOSIS — Z62810 Personal history of physical and sexual abuse in childhood: Secondary | ICD-10-CM

## 2017-10-22 DIAGNOSIS — Z634 Disappearance and death of family member: Secondary | ICD-10-CM | POA: Diagnosis not present
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
## 2017-11-26 ENCOUNTER — Ambulatory Visit (HOSPITAL_COMMUNITY): Payer: Medicare HMO | Admitting: Licensed Clinical Social Worker

## 2017-12-20 ENCOUNTER — Encounter (HOSPITAL_COMMUNITY): Payer: Self-pay | Admitting: Licensed Clinical Social Worker

## 2017-12-20 ENCOUNTER — Ambulatory Visit (HOSPITAL_COMMUNITY): Payer: Medicare HMO | Admitting: Licensed Clinical Social Worker

## 2017-12-20 DIAGNOSIS — F332 Major depressive disorder, recurrent severe without psychotic features: Secondary | ICD-10-CM
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
## 2018-01-14 ENCOUNTER — Telehealth (HOSPITAL_COMMUNITY): Payer: Self-pay | Admitting: *Deleted

## 2018-01-14 ENCOUNTER — Encounter (HOSPITAL_COMMUNITY): Payer: Self-pay | Admitting: Psychiatry

## 2018-01-14 NOTE — Telephone Encounter (Signed)
done

## 2018-01-14 NOTE — Telephone Encounter (Signed)
Dr Harrington Challenger Patient's wife called stating the patient has jury duty on 01/21/18.  And that he is saying he doesn't think  he will be able to do it. Requesting a note to excuse from jury duty 01/21/18. Kept repeating that it would be need soon as possible. I informed that  It's 7 days before jury duty & you are just now submitting this request.

## 2018-01-21 ENCOUNTER — Ambulatory Visit (HOSPITAL_COMMUNITY): Payer: Medicare HMO | Admitting: Psychiatry

## 2018-01-22 ENCOUNTER — Encounter (HOSPITAL_COMMUNITY): Payer: Self-pay | Admitting: Psychiatry

## 2018-01-22 ENCOUNTER — Ambulatory Visit (HOSPITAL_COMMUNITY): Payer: Medicare HMO | Admitting: Psychiatry

## 2018-01-22 VITALS — BP 145/83 | HR 68 | Ht 71.5 in | Wt 339.0 lb

## 2018-01-22 DIAGNOSIS — Z62811 Personal history of psychological abuse in childhood: Secondary | ICD-10-CM

## 2018-01-22 DIAGNOSIS — F4310 Post-traumatic stress disorder, unspecified: Secondary | ICD-10-CM

## 2018-01-22 DIAGNOSIS — Z560 Unemployment, unspecified: Secondary | ICD-10-CM | POA: Diagnosis not present

## 2018-01-22 DIAGNOSIS — M549 Dorsalgia, unspecified: Secondary | ICD-10-CM | POA: Diagnosis not present

## 2018-01-22 DIAGNOSIS — Z736 Limitation of activities due to disability: Secondary | ICD-10-CM | POA: Diagnosis not present

## 2018-01-22 DIAGNOSIS — Z62810 Personal history of physical and sexual abuse in childhood: Secondary | ICD-10-CM

## 2018-01-22 DIAGNOSIS — F419 Anxiety disorder, unspecified: Secondary | ICD-10-CM

## 2018-01-22 DIAGNOSIS — Z818 Family history of other mental and behavioral disorders: Secondary | ICD-10-CM | POA: Diagnosis not present

## 2018-01-22 DIAGNOSIS — F332 Major depressive disorder, recurrent severe without psychotic features: Secondary | ICD-10-CM

## 2018-01-22 DIAGNOSIS — Z811 Family history of alcohol abuse and dependence: Secondary | ICD-10-CM | POA: Diagnosis not present

## 2018-01-22 MED ORDER — ALPRAZOLAM 1 MG PO TABS: 1.0000 mg | ORAL_TABLET | Freq: Four times a day (QID) | ORAL | 1 refills | Status: DC
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
## 2018-02-19 ENCOUNTER — Other Ambulatory Visit (HOSPITAL_COMMUNITY): Payer: Self-pay | Admitting: Psychiatry

## 2018-02-19 ENCOUNTER — Encounter (HOSPITAL_COMMUNITY): Payer: Self-pay | Admitting: Licensed Clinical Social Worker

## 2018-02-19 ENCOUNTER — Ambulatory Visit (HOSPITAL_COMMUNITY): Payer: Medicare HMO | Admitting: Licensed Clinical Social Worker

## 2018-02-19 DIAGNOSIS — F332 Major depressive disorder, recurrent severe without psychotic features: Secondary | ICD-10-CM
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
## 2018-02-26 DIAGNOSIS — R7301 Impaired fasting glucose: Secondary | ICD-10-CM | POA: Diagnosis not present

## 2018-02-26 DIAGNOSIS — E782 Mixed hyperlipidemia: Secondary | ICD-10-CM | POA: Diagnosis not present

## 2018-02-26 DIAGNOSIS — E039 Hypothyroidism, unspecified: Secondary | ICD-10-CM | POA: Diagnosis not present

## 2018-02-28 DIAGNOSIS — M25569 Pain in unspecified knee: Secondary | ICD-10-CM | POA: Diagnosis not present

## 2018-02-28 DIAGNOSIS — F419 Anxiety disorder, unspecified: Secondary | ICD-10-CM | POA: Diagnosis not present

## 2018-02-28 DIAGNOSIS — E039 Hypothyroidism, unspecified: Secondary | ICD-10-CM | POA: Diagnosis not present

## 2018-02-28 DIAGNOSIS — R7301 Impaired fasting glucose: Secondary | ICD-10-CM | POA: Diagnosis not present

## 2018-02-28 DIAGNOSIS — E782 Mixed hyperlipidemia: Secondary | ICD-10-CM | POA: Diagnosis not present

## 2018-02-28 DIAGNOSIS — E876 Hypokalemia: Secondary | ICD-10-CM | POA: Diagnosis not present

## 2018-02-28 DIAGNOSIS — R944 Abnormal results of kidney function studies: Secondary | ICD-10-CM | POA: Diagnosis not present

## 2018-02-28 DIAGNOSIS — Z0000 Encounter for general adult medical examination without abnormal findings: Secondary | ICD-10-CM | POA: Diagnosis not present

## 2018-02-28 DIAGNOSIS — E6609 Other obesity due to excess calories: Secondary | ICD-10-CM | POA: Diagnosis not present

## 2018-04-22 ENCOUNTER — Ambulatory Visit (HOSPITAL_COMMUNITY): Payer: Medicare HMO | Admitting: Licensed Clinical Social Worker

## 2018-04-22 ENCOUNTER — Encounter (HOSPITAL_COMMUNITY): Payer: Self-pay | Admitting: Licensed Clinical Social Worker

## 2018-04-22 DIAGNOSIS — F332 Major depressive disorder, recurrent severe without psychotic features: Secondary | ICD-10-CM
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
## 2018-04-23 ENCOUNTER — Ambulatory Visit (HOSPITAL_COMMUNITY): Payer: Self-pay | Admitting: Psychiatry

## 2018-04-23 ENCOUNTER — Ambulatory Visit (HOSPITAL_COMMUNITY): Payer: Medicare HMO | Admitting: Psychiatry

## 2018-05-02 ENCOUNTER — Ambulatory Visit (HOSPITAL_COMMUNITY): Payer: Medicare HMO | Admitting: Psychiatry

## 2018-05-08 ENCOUNTER — Ambulatory Visit (HOSPITAL_COMMUNITY): Payer: Medicare HMO | Admitting: Psychiatry

## 2018-05-08 ENCOUNTER — Encounter (HOSPITAL_COMMUNITY): Payer: Self-pay | Admitting: Psychiatry

## 2018-05-08 VITALS — BP 137/81 | HR 78 | Ht 71.5 in | Wt 337.0 lb

## 2018-05-08 DIAGNOSIS — R52 Pain, unspecified: Secondary | ICD-10-CM | POA: Diagnosis not present

## 2018-05-08 DIAGNOSIS — F5105 Insomnia due to other mental disorder: Secondary | ICD-10-CM | POA: Diagnosis not present

## 2018-05-08 DIAGNOSIS — F332 Major depressive disorder, recurrent severe without psychotic features: Secondary | ICD-10-CM

## 2018-05-08 DIAGNOSIS — F419 Anxiety disorder, unspecified: Secondary | ICD-10-CM

## 2018-05-08 MED ORDER — GABAPENTIN 300 MG PO CAPS: ORAL_CAPSULE | ORAL | 2 refills | Status: DC

## 2018-05-08 MED ORDER — TRAZODONE HCL 100 MG PO TABS: 100.0000 mg | ORAL_TABLET | Freq: Every day | ORAL | 2 refills | Status: DC

## 2018-05-08 MED ORDER — BENZTROPINE MESYLATE 0.5 MG PO TABS: 0.5000 mg | ORAL_TABLET | Freq: Two times a day (BID) | ORAL | 2 refills | Status: DC

## 2018-05-08 MED ORDER — DULOXETINE HCL 60 MG PO CPEP: 60.0000 mg | ORAL_CAPSULE | Freq: Every day | ORAL | 2 refills | Status: DC

## 2018-05-08 MED ORDER — ALPRAZOLAM 1 MG PO TABS: 1.0000 mg | ORAL_TABLET | Freq: Four times a day (QID) | ORAL | 1 refills | Status: DC
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
## 2018-06-06 DIAGNOSIS — I10 Essential (primary) hypertension: Secondary | ICD-10-CM | POA: Diagnosis not present

## 2018-06-06 DIAGNOSIS — F419 Anxiety disorder, unspecified: Secondary | ICD-10-CM | POA: Diagnosis not present

## 2018-06-06 DIAGNOSIS — F411 Generalized anxiety disorder: Secondary | ICD-10-CM | POA: Diagnosis not present

## 2018-06-06 DIAGNOSIS — R944 Abnormal results of kidney function studies: Secondary | ICD-10-CM | POA: Diagnosis not present

## 2018-06-06 DIAGNOSIS — E039 Hypothyroidism, unspecified: Secondary | ICD-10-CM | POA: Diagnosis not present

## 2018-06-06 DIAGNOSIS — F339 Major depressive disorder, recurrent, unspecified: Secondary | ICD-10-CM | POA: Diagnosis not present

## 2018-06-06 DIAGNOSIS — K219 Gastro-esophageal reflux disease without esophagitis: Secondary | ICD-10-CM | POA: Diagnosis not present

## 2018-06-06 DIAGNOSIS — F4310 Post-traumatic stress disorder, unspecified: Secondary | ICD-10-CM | POA: Diagnosis not present

## 2018-06-06 DIAGNOSIS — F39 Unspecified mood [affective] disorder: Secondary | ICD-10-CM | POA: Diagnosis not present

## 2018-06-25 ENCOUNTER — Ambulatory Visit (HOSPITAL_COMMUNITY): Payer: Self-pay | Admitting: Licensed Clinical Social Worker

## 2018-08-08 ENCOUNTER — Telehealth (HOSPITAL_COMMUNITY): Payer: Self-pay | Admitting: *Deleted

## 2018-08-08 ENCOUNTER — Ambulatory Visit (HOSPITAL_COMMUNITY): Payer: Self-pay | Admitting: Psychiatry

## 2018-08-08 NOTE — Telephone Encounter (Signed)
Dr Harrington Challenger Requesting  Samples of Latuda  80 mg. Patient is out of medication & next appointment is on Tuesday 08/13/18. Patient wife states that they can't afford the medication he's on disability  & the medication is $ 700.00

## 2018-08-08 NOTE — Telephone Encounter (Signed)
ok 

## 2018-08-13 ENCOUNTER — Encounter (HOSPITAL_COMMUNITY): Payer: Self-pay | Admitting: Psychiatry

## 2018-08-13 ENCOUNTER — Ambulatory Visit (INDEPENDENT_AMBULATORY_CARE_PROVIDER_SITE_OTHER): Payer: Medicare HMO | Admitting: Psychiatry

## 2018-08-13 DIAGNOSIS — F5105 Insomnia due to other mental disorder: Secondary | ICD-10-CM | POA: Diagnosis not present

## 2018-08-13 DIAGNOSIS — F419 Anxiety disorder, unspecified: Secondary | ICD-10-CM | POA: Diagnosis not present

## 2018-08-13 DIAGNOSIS — R52 Pain, unspecified: Secondary | ICD-10-CM

## 2018-08-13 MED ORDER — ALPRAZOLAM 1 MG PO TABS: 1.0000 mg | ORAL_TABLET | Freq: Four times a day (QID) | ORAL | 1 refills | Status: DC

## 2018-08-13 MED ORDER — DULOXETINE HCL 60 MG PO CPEP: 60.0000 mg | ORAL_CAPSULE | Freq: Every day | ORAL | 2 refills | Status: DC

## 2018-08-13 MED ORDER — BENZTROPINE MESYLATE 0.5 MG PO TABS: 0.5000 mg | ORAL_TABLET | Freq: Two times a day (BID) | ORAL | 2 refills | Status: DC

## 2018-08-13 MED ORDER — GABAPENTIN 300 MG PO CAPS: ORAL_CAPSULE | ORAL | 2 refills | Status: DC
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
## 2018-08-16 ENCOUNTER — Ambulatory Visit (HOSPITAL_COMMUNITY): Payer: Medicare HMO | Admitting: Licensed Clinical Social Worker

## 2018-08-21 ENCOUNTER — Ambulatory Visit (HOSPITAL_COMMUNITY): Payer: Medicare HMO | Admitting: Licensed Clinical Social Worker

## 2018-08-21 ENCOUNTER — Encounter (HOSPITAL_COMMUNITY): Payer: Self-pay | Admitting: Licensed Clinical Social Worker

## 2018-08-21 DIAGNOSIS — F332 Major depressive disorder, recurrent severe without psychotic features: Secondary | ICD-10-CM | POA: Diagnosis not present
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
## 2018-10-18 ENCOUNTER — Encounter (HOSPITAL_COMMUNITY): Payer: Self-pay | Admitting: Licensed Clinical Social Worker

## 2018-10-18 ENCOUNTER — Ambulatory Visit (INDEPENDENT_AMBULATORY_CARE_PROVIDER_SITE_OTHER): Payer: Medicare HMO | Admitting: Licensed Clinical Social Worker

## 2018-10-18 DIAGNOSIS — F332 Major depressive disorder, recurrent severe without psychotic features: Secondary | ICD-10-CM
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
## 2018-10-31 ENCOUNTER — Ambulatory Visit (HOSPITAL_COMMUNITY): Payer: Medicare HMO | Admitting: Licensed Clinical Social Worker

## 2018-10-31 ENCOUNTER — Telehealth (HOSPITAL_COMMUNITY): Payer: Self-pay | Admitting: *Deleted

## 2018-10-31 NOTE — Telephone Encounter (Signed)
Dr Harrington Challenger Patient's wife called stating patient is out of Latuda 80 mg. Requesting SAMPLES. Next appointment is 2/5//2020

## 2018-11-01 ENCOUNTER — Telehealth (HOSPITAL_COMMUNITY): Payer: Self-pay | Admitting: *Deleted

## 2018-11-01 NOTE — Telephone Encounter (Signed)
Left VM that samples are available for pick-up & that today is Friday & we close @ 12:00

## 2018-11-01 NOTE — Telephone Encounter (Signed)
ok 

## 2018-11-08 DIAGNOSIS — M25569 Pain in unspecified knee: Secondary | ICD-10-CM | POA: Diagnosis not present

## 2018-11-08 DIAGNOSIS — K219 Gastro-esophageal reflux disease without esophagitis: Secondary | ICD-10-CM | POA: Diagnosis not present

## 2018-11-08 DIAGNOSIS — E0590 Thyrotoxicosis, unspecified without thyrotoxic crisis or storm: Secondary | ICD-10-CM | POA: Diagnosis not present

## 2018-11-08 DIAGNOSIS — F4310 Post-traumatic stress disorder, unspecified: Secondary | ICD-10-CM | POA: Diagnosis not present

## 2018-11-08 DIAGNOSIS — R7301 Impaired fasting glucose: Secondary | ICD-10-CM | POA: Diagnosis not present

## 2018-11-08 DIAGNOSIS — F411 Generalized anxiety disorder: Secondary | ICD-10-CM | POA: Diagnosis not present

## 2018-11-08 DIAGNOSIS — I10 Essential (primary) hypertension: Secondary | ICD-10-CM | POA: Diagnosis not present

## 2018-11-08 DIAGNOSIS — E291 Testicular hypofunction: Secondary | ICD-10-CM | POA: Diagnosis not present

## 2018-11-08 DIAGNOSIS — E039 Hypothyroidism, unspecified: Secondary | ICD-10-CM | POA: Diagnosis not present

## 2018-11-13 ENCOUNTER — Ambulatory Visit (HOSPITAL_COMMUNITY): Payer: Medicare HMO | Admitting: Psychiatry

## 2018-11-20 ENCOUNTER — Telehealth (HOSPITAL_COMMUNITY): Payer: Self-pay | Admitting: *Deleted

## 2018-11-20 ENCOUNTER — Ambulatory Visit (INDEPENDENT_AMBULATORY_CARE_PROVIDER_SITE_OTHER): Payer: Medicare HMO | Admitting: Psychiatry

## 2018-11-20 ENCOUNTER — Encounter (HOSPITAL_COMMUNITY): Payer: Self-pay | Admitting: Psychiatry

## 2018-11-20 ENCOUNTER — Other Ambulatory Visit (HOSPITAL_COMMUNITY): Payer: Self-pay | Admitting: Psychiatry

## 2018-11-20 VITALS — BP 129/84 | HR 81 | Ht 71.5 in | Wt 348.0 lb

## 2018-11-20 DIAGNOSIS — R52 Pain, unspecified: Secondary | ICD-10-CM | POA: Diagnosis not present

## 2018-11-20 DIAGNOSIS — F332 Major depressive disorder, recurrent severe without psychotic features: Secondary | ICD-10-CM | POA: Diagnosis not present

## 2018-11-20 DIAGNOSIS — Z2821 Immunization not carried out because of patient refusal: Secondary | ICD-10-CM | POA: Diagnosis not present

## 2018-11-20 DIAGNOSIS — E039 Hypothyroidism, unspecified: Secondary | ICD-10-CM | POA: Diagnosis not present

## 2018-11-20 DIAGNOSIS — E782 Mixed hyperlipidemia: Secondary | ICD-10-CM | POA: Diagnosis not present

## 2018-11-20 DIAGNOSIS — F5105 Insomnia due to other mental disorder: Secondary | ICD-10-CM

## 2018-11-20 DIAGNOSIS — F419 Anxiety disorder, unspecified: Secondary | ICD-10-CM | POA: Diagnosis not present

## 2018-11-20 DIAGNOSIS — R7301 Impaired fasting glucose: Secondary | ICD-10-CM | POA: Diagnosis not present

## 2018-11-20 DIAGNOSIS — F3181 Bipolar II disorder: Secondary | ICD-10-CM | POA: Diagnosis not present

## 2018-11-20 DIAGNOSIS — G3184 Mild cognitive impairment, so stated: Secondary | ICD-10-CM | POA: Diagnosis not present

## 2018-11-20 DIAGNOSIS — F411 Generalized anxiety disorder: Secondary | ICD-10-CM | POA: Diagnosis not present

## 2018-11-20 DIAGNOSIS — N183 Chronic kidney disease, stage 3 (moderate): Secondary | ICD-10-CM | POA: Diagnosis not present

## 2018-11-20 MED ORDER — DULOXETINE HCL 60 MG PO CPEP: 60.0000 mg | ORAL_CAPSULE | Freq: Every day | ORAL | 2 refills | Status: DC

## 2018-11-20 MED ORDER — TRAZODONE HCL 100 MG PO TABS: 100.0000 mg | ORAL_TABLET | Freq: Every day | ORAL | 2 refills | Status: DC

## 2018-11-20 MED ORDER — ALPRAZOLAM 1 MG PO TABS: 1.0000 mg | ORAL_TABLET | Freq: Four times a day (QID) | ORAL | 1 refills | Status: DC

## 2018-11-20 MED ORDER — BENZTROPINE MESYLATE 0.5 MG PO TABS: 0.5000 mg | ORAL_TABLET | Freq: Two times a day (BID) | ORAL | 2 refills | Status: DC

## 2018-11-20 MED ORDER — LURASIDONE HCL 80 MG PO TABS: ORAL_TABLET | ORAL | 2 refills | Status: DC

## 2018-11-20 NOTE — Telephone Encounter (Signed)
[employer]  APPROVED Latuda 80 mg Tablet   GOOD Until 10/09/2019

## 2018-11-20 NOTE — Telephone Encounter (Signed)
Rx sent over a FYI: Patient didn't  Fill script Co-Pay is  [phone] Provider Notified: Dr Harrington Challenger
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
## 2018-11-21 ENCOUNTER — Ambulatory Visit (HOSPITAL_COMMUNITY): Payer: Medicare HMO | Admitting: Licensed Clinical Social Worker

## 2019-01-17 ENCOUNTER — Ambulatory Visit (HOSPITAL_COMMUNITY): Payer: Medicare HMO | Admitting: Licensed Clinical Social Worker

## 2019-01-17 ENCOUNTER — Other Ambulatory Visit: Payer: Self-pay

## 2019-01-30 DIAGNOSIS — Z0000 Encounter for general adult medical examination without abnormal findings: Secondary | ICD-10-CM | POA: Diagnosis not present

## 2019-02-10 DIAGNOSIS — N529 Male erectile dysfunction, unspecified: Secondary | ICD-10-CM | POA: Diagnosis not present

## 2019-02-10 DIAGNOSIS — I10 Essential (primary) hypertension: Secondary | ICD-10-CM | POA: Diagnosis not present

## 2019-02-10 DIAGNOSIS — R7301 Impaired fasting glucose: Secondary | ICD-10-CM | POA: Diagnosis not present

## 2019-02-10 DIAGNOSIS — E782 Mixed hyperlipidemia: Secondary | ICD-10-CM | POA: Diagnosis not present

## 2019-02-10 DIAGNOSIS — E291 Testicular hypofunction: Secondary | ICD-10-CM | POA: Diagnosis not present

## 2019-02-10 DIAGNOSIS — E039 Hypothyroidism, unspecified: Secondary | ICD-10-CM | POA: Diagnosis not present

## 2019-02-11 DIAGNOSIS — E039 Hypothyroidism, unspecified: Secondary | ICD-10-CM | POA: Diagnosis not present

## 2019-02-11 DIAGNOSIS — F411 Generalized anxiety disorder: Secondary | ICD-10-CM | POA: Diagnosis not present

## 2019-02-11 DIAGNOSIS — F3181 Bipolar II disorder: Secondary | ICD-10-CM | POA: Diagnosis not present

## 2019-02-11 DIAGNOSIS — E785 Hyperlipidemia, unspecified: Secondary | ICD-10-CM | POA: Diagnosis not present

## 2019-02-11 DIAGNOSIS — N183 Chronic kidney disease, stage 3 (moderate): Secondary | ICD-10-CM | POA: Diagnosis not present

## 2019-02-18 ENCOUNTER — Encounter (HOSPITAL_COMMUNITY): Payer: Self-pay | Admitting: Psychiatry

## 2019-02-18 ENCOUNTER — Ambulatory Visit (INDEPENDENT_AMBULATORY_CARE_PROVIDER_SITE_OTHER): Payer: Medicare HMO | Admitting: Psychiatry

## 2019-02-18 ENCOUNTER — Other Ambulatory Visit: Payer: Self-pay

## 2019-02-18 DIAGNOSIS — F332 Major depressive disorder, recurrent severe without psychotic features: Secondary | ICD-10-CM | POA: Diagnosis not present

## 2019-02-18 MED ORDER — ALPRAZOLAM 1 MG PO TABS: 1.0000 mg | ORAL_TABLET | Freq: Four times a day (QID) | ORAL | 2 refills | Status: DC
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
## 2019-02-24 ENCOUNTER — Ambulatory Visit (HOSPITAL_COMMUNITY): Payer: Medicare HMO | Admitting: Licensed Clinical Social Worker

## 2019-02-24 ENCOUNTER — Other Ambulatory Visit: Payer: Self-pay

## 2019-05-16 DIAGNOSIS — E291 Testicular hypofunction: Secondary | ICD-10-CM | POA: Diagnosis not present

## 2019-05-16 DIAGNOSIS — E785 Hyperlipidemia, unspecified: Secondary | ICD-10-CM | POA: Diagnosis not present

## 2019-05-16 DIAGNOSIS — E782 Mixed hyperlipidemia: Secondary | ICD-10-CM | POA: Diagnosis not present

## 2019-05-16 DIAGNOSIS — I10 Essential (primary) hypertension: Secondary | ICD-10-CM | POA: Diagnosis not present

## 2019-05-16 DIAGNOSIS — E039 Hypothyroidism, unspecified: Secondary | ICD-10-CM | POA: Diagnosis not present

## 2019-05-16 DIAGNOSIS — N529 Male erectile dysfunction, unspecified: Secondary | ICD-10-CM | POA: Diagnosis not present

## 2019-05-16 DIAGNOSIS — Z125 Encounter for screening for malignant neoplasm of prostate: Secondary | ICD-10-CM | POA: Diagnosis not present

## 2019-05-16 DIAGNOSIS — R7301 Impaired fasting glucose: Secondary | ICD-10-CM | POA: Diagnosis not present

## 2019-05-21 ENCOUNTER — Ambulatory Visit (INDEPENDENT_AMBULATORY_CARE_PROVIDER_SITE_OTHER): Payer: Medicare HMO | Admitting: Psychiatry

## 2019-05-21 ENCOUNTER — Encounter (HOSPITAL_COMMUNITY): Payer: Self-pay | Admitting: Psychiatry

## 2019-05-21 ENCOUNTER — Other Ambulatory Visit: Payer: Self-pay

## 2019-05-21 DIAGNOSIS — E785 Hyperlipidemia, unspecified: Secondary | ICD-10-CM | POA: Diagnosis not present

## 2019-05-21 DIAGNOSIS — F419 Anxiety disorder, unspecified: Secondary | ICD-10-CM | POA: Diagnosis not present

## 2019-05-21 DIAGNOSIS — F332 Major depressive disorder, recurrent severe without psychotic features: Secondary | ICD-10-CM | POA: Diagnosis not present

## 2019-05-21 DIAGNOSIS — R7301 Impaired fasting glucose: Secondary | ICD-10-CM | POA: Diagnosis not present

## 2019-05-21 DIAGNOSIS — E039 Hypothyroidism, unspecified: Secondary | ICD-10-CM | POA: Diagnosis not present

## 2019-05-21 DIAGNOSIS — Z0001 Encounter for general adult medical examination with abnormal findings: Secondary | ICD-10-CM | POA: Diagnosis not present

## 2019-05-21 DIAGNOSIS — F3181 Bipolar II disorder: Secondary | ICD-10-CM | POA: Diagnosis not present

## 2019-05-21 DIAGNOSIS — F411 Generalized anxiety disorder: Secondary | ICD-10-CM | POA: Diagnosis not present

## 2019-05-21 DIAGNOSIS — R110 Nausea: Secondary | ICD-10-CM | POA: Diagnosis not present

## 2019-05-21 DIAGNOSIS — N183 Chronic kidney disease, stage 3 (moderate): Secondary | ICD-10-CM | POA: Diagnosis not present

## 2019-05-21 DIAGNOSIS — R109 Unspecified abdominal pain: Secondary | ICD-10-CM | POA: Diagnosis not present

## 2019-05-21 MED ORDER — TRAZODONE HCL 100 MG PO TABS: 100.0000 mg | ORAL_TABLET | Freq: Every day | ORAL | 2 refills | Status: DC

## 2019-05-21 MED ORDER — ALPRAZOLAM 1 MG PO TABS: 1.0000 mg | ORAL_TABLET | Freq: Four times a day (QID) | ORAL | 2 refills | Status: DC

## 2019-05-21 MED ORDER — DULOXETINE HCL 60 MG PO CPEP: 60.0000 mg | ORAL_CAPSULE | Freq: Every day | ORAL | 2 refills | Status: DC

## 2019-05-21 NOTE — Progress Notes (Signed)
Virtual Visit via Video Note  I connected with Greg Kemp on 05/21/19 at  1:00 PM EDT by a video enabled telemedicine application and verified that I am speaking with the correct person using two identifiers.   I discussed the limitations of evaluation and management by telemedicine and the availability of in person appointments. The patient expressed understanding and agreed to proceed.     I discussed the assessment and treatment plan with the patient. The patient was provided an opportunity to ask questions and all were answered. The patient agreed with the plan and demonstrated an understanding of the instructions.   The patient was advised to call back or seek an in-person evaluation if the symptoms worsen or if the condition fails to improve as anticipated.  I provided 15 minutes of non-face-to-face time during this encounter.   [name], MD  [facility] MD/PA/NP OP Progress Note  05/21/2019 1:21 PM Greg Kemp  MRN:  [id number]  Chief Complaint:  Chief Complaint    Depression; Anxiety; Follow-up     HPI: This patient is a 58 year old married white male who lives with his wife in [place].  He is on disability.  The patient returns with his wife for follow-up regarding his depression posttraumatic stress disorder and anxiety.  He states that he has been doing well.  He has been going to visit family members and his grandchildren.  He used to have a lot of trouble with this and he still does have some anxiety but he takes his Xanax and does fine.  He is generally sleeping well denies serious depression suicidal ideation or panic attacks.  After gait goes on 1 of the family visits his wife reports that he is very exhausted.  He states it "takes a lot out of me" to be around people but he is trying.  He states that he had a physical today and everything checked out well his wife states that his kidney function tests are improving. Visit Diagnosis:    ICD-10-CM   1. Major  depressive disorder, recurrent, severe without psychotic features ([place])  F33.2   2. Anxiety  F41.9     Past Psychiatric History: Several past psychiatric hospitalizations but none since 2012  Past Medical History:  Past Medical History:  Diagnosis Date  . Acid reflux   . Anxiety   . Depression   . Depression   . GERD (gastroesophageal reflux disease)   . Morbid obesity ([place])   . Thyroid disease     Past Surgical History:  Procedure Laterality Date  . TONSILLECTOMY    . Upper and Lower GI      Family Psychiatric History: See below  Family History:  Family History  Problem Relation Age of Onset  . Alcohol abuse Father   . Colon cancer Father   . COPD Father   . Depression Daughter        acute  . Sexual abuse Daughter   . Lung disease Mother   . COPD Mother   . Thyroid disease Mother   . Alcohol abuse Paternal Uncle   . ADD / ADHD Neg Hx   . Anxiety disorder Neg Hx   . OCD Neg Hx   . Drug abuse Neg Hx   . Bipolar disorder Neg Hx   . Dementia Neg Hx   . Paranoid behavior Neg Hx   . Schizophrenia Neg Hx   . Seizures Neg Hx   . Physical abuse Neg Hx  Social History:  Social History   Socioeconomic History  . Marital status: Married    Spouse name: Not on file  . Number of children: Not on file  . Years of education: Not on file  . Highest education level: Not on file  Occupational History  . Not on file  Social Needs  . Financial resource strain: Not on file  . Food insecurity    Worry: Not on file    Inability: Not on file  . Transportation needs    Medical: Not on file    Non-medical: Not on file  Tobacco Use  . Smoking status: Never Smoker  . Smokeless tobacco: Never Used  Substance and Sexual Activity  . Alcohol use: Yes    Alcohol/week: 3.0 standard drinks    Types: 3 Cans of beer per week  . Drug use: No  . Sexual activity: Yes    Birth control/protection: Surgical  Lifestyle  . Physical activity    Days per week: Not on file     Minutes per session: Not on file  . Stress: Not on file  Relationships  . Social Herbalist on phone: Not on file    Gets together: Not on file    Attends religious service: Not on file    Active member of club or organization: Not on file    Attends meetings of clubs or organizations: Not on file    Relationship status: Not on file  Other Topics Concern  . Not on file  Social History Narrative  . Not on file    Allergies: No Active Allergies  Metabolic Disorder Labs: No results found for: HGBA1C, MPG No results found for: PROLACTIN Lab Results  Component Value Date   CHOL  06/16/2010    166        ATP III CLASSIFICATION:  <200     mg/dL   Desirable  200-239  mg/dL   Borderline High  >=240    mg/dL   High          TRIG 166 (H) 06/16/2010   HDL 29 (L) 06/16/2010   CHOLHDL 5.7 06/16/2010   VLDL 33 06/16/2010   LDLCALC (H) 06/16/2010    104        Total Cholesterol/HDL:CHD Risk Coronary Heart Disease Risk Table                     Men   Women  1/2 Average Risk   3.4   3.3  Average Risk       5.0   4.4  2 X Average Risk   9.6   7.1  3 X Average Risk  23.4   11.0        Use the calculated Patient Ratio above and the CHD Risk Table to determine the patient's CHD Risk.        ATP III CLASSIFICATION (LDL):  <100     mg/dL   Optimal  100-129  mg/dL   Near or Above                    Optimal  130-159  mg/dL   Borderline  160-189  mg/dL   High  >190     mg/dL   Very High   Lab Results  Component Value Date   TSH 11.645 (H) 12/14/2010   TSH 0.264 (L) 06/16/2010    Therapeutic Level Labs: No results found for: LITHIUM No results found  for: VALPROATE No components found for:  CBMZ  Current Medications: Current Outpatient Medications  Medication Sig Dispense Refill  . ALPRAZolam (XANAX) 1 MG tablet Take 1 tablet (1 mg total) by mouth 4 (four) times daily. 120 tablet 2  . DULoxetine (CYMBALTA) 60 MG capsule Take 1 capsule (60 mg total) by mouth daily.  90 capsule 2  . levothyroxine (SYNTHROID, LEVOTHROID) 200 MCG tablet Take 100 mcg by mouth daily before breakfast. Taking 1.5 Tablets Daily    . Multiple Vitamin (MULTIVITAMIN) tablet Take 1 tablet by mouth daily.      . pantoprazole (PROTONIX) 40 MG tablet Take 40 mg by mouth daily.    . Potassium (POTASSIMIN PO) Take by mouth daily.    . traZODone (DESYREL) 100 MG tablet Take 1 tablet (100 mg total) by mouth at bedtime. 90 tablet 2   No current facility-administered medications for this visit.      Musculoskeletal: Strength & Muscle Tone: within normal limits Gait & Station: normal Patient leans: N/A  Psychiatric Specialty Exam: Review of Systems  Gastrointestinal: Positive for heartburn.  Psychiatric/Behavioral: The patient is nervous/anxious.   All other systems reviewed and are negative.   There were no vitals taken for this visit.There is no height or weight on file to calculate BMI.  General Appearance: Casual and Fairly Groomed  Eye Contact:  Good  Speech:  Clear and Coherent  Volume:  Normal  Mood:  Anxious and Euthymic  Affect:  Appropriate and Congruent  Thought Process:  Goal Directed  Orientation:  Full (Time, Place, and Person)  Thought Content: Rumination   Suicidal Thoughts:  No  Homicidal Thoughts:  No  Memory:  Immediate;   Good Recent;   Good Remote;   Fair  Judgement:  Good  Insight:  Good  Psychomotor Activity:  Decreased  Concentration:  Concentration: Good and Attention Span: Good  Recall:  Good  Fund of Knowledge: Good  Language: Good  Akathisia:  No  Handed:  Right  AIMS (if indicated): not done  Assets:  Communication Skills Desire for Improvement Resilience Social Support Talents/Skills  ADL's:  Intact  Cognition: WNL  Sleep:  Good   Screenings:   Assessment and Plan: This patient is a 58 year old male with a history of posttraumatic stress disorder, depression and anxiety.  He is doing well even though he has stopped his mood  stabilizer.  He will continue Cymbalta 60 mg daily for depression, trazodone 100 mg at bedtime for sleep and Xanax 1 mg up to 4 times daily for anxiety.  He will return to see me in 3 months   [name], MD 05/21/2019, 1:21 PM

## 2019-05-30 ENCOUNTER — Encounter: Payer: Self-pay | Admitting: Internal Medicine

## 2019-07-03 DIAGNOSIS — R7301 Impaired fasting glucose: Secondary | ICD-10-CM | POA: Diagnosis not present

## 2019-07-03 DIAGNOSIS — R109 Unspecified abdominal pain: Secondary | ICD-10-CM | POA: Diagnosis not present

## 2019-07-03 DIAGNOSIS — R110 Nausea: Secondary | ICD-10-CM | POA: Diagnosis not present

## 2019-07-03 DIAGNOSIS — E039 Hypothyroidism, unspecified: Secondary | ICD-10-CM | POA: Diagnosis not present

## 2019-07-03 DIAGNOSIS — F411 Generalized anxiety disorder: Secondary | ICD-10-CM | POA: Diagnosis not present

## 2019-07-03 DIAGNOSIS — F3181 Bipolar II disorder: Secondary | ICD-10-CM | POA: Diagnosis not present

## 2019-07-03 DIAGNOSIS — N183 Chronic kidney disease, stage 3 (moderate): Secondary | ICD-10-CM | POA: Diagnosis not present

## 2019-07-03 DIAGNOSIS — Z0001 Encounter for general adult medical examination with abnormal findings: Secondary | ICD-10-CM | POA: Diagnosis not present

## 2019-07-03 DIAGNOSIS — E785 Hyperlipidemia, unspecified: Secondary | ICD-10-CM | POA: Diagnosis not present

## 2019-07-04 ENCOUNTER — Ambulatory Visit: Payer: Medicare HMO | Admitting: Gastroenterology

## 2019-07-22 ENCOUNTER — Encounter: Payer: Self-pay | Admitting: Gastroenterology

## 2019-07-22 NOTE — Progress Notes (Deleted)
Primary Care Physician:  Celene Squibb, MD  Primary Gastroenterologist:  Garfield Cornea, MD   No chief complaint on file.   HPI:  Greg Kemp is a 58 y.o. male here  EGD/TCS 02/2009: ulcerative/erosive reflux esophagitis, noncritical Schatzki's ring, hh, multiple bulbar erosions,  Small bowel mucosa normal. Colon diverticulosis, normal TI.  Current Outpatient Medications  Medication Sig Dispense Refill  . ALPRAZolam (XANAX) 1 MG tablet Take 1 tablet (1 mg total) by mouth 4 (four) times daily. 120 tablet 2  . DULoxetine (CYMBALTA) 60 MG capsule Take 1 capsule (60 mg total) by mouth daily. 90 capsule 2  . levothyroxine (SYNTHROID, LEVOTHROID) 200 MCG tablet Take 100 mcg by mouth daily before breakfast. Taking 1.5 Tablets Daily    . Multiple Vitamin (MULTIVITAMIN) tablet Take 1 tablet by mouth daily.      . pantoprazole (PROTONIX) 40 MG tablet Take 40 mg by mouth daily.    . Potassium (POTASSIMIN PO) Take by mouth daily.    . traZODone (DESYREL) 100 MG tablet Take 1 tablet (100 mg total) by mouth at bedtime. 90 tablet 2   No current facility-administered medications for this visit.     Allergies as of 07/23/2019  . (No Active Allergies)    Past Medical History:  Diagnosis Date  . Acid reflux   . Anxiety   . Depression   . Depression   . GERD (gastroesophageal reflux disease)   . Morbid obesity ([place])   . Thyroid disease     Past Surgical History:  Procedure Laterality Date  . COLONOSCOPY WITH ESOPHAGOGASTRODUODENOSCOPY (EGD)  2010   Dr. Gala Romney: ulcerative/erosive reflux esophagitis, noncritical schatki's ring, multiple bulbar erosions with normal sb biopsies, colonic diverticulosis, normal TI  . TONSILLECTOMY    . Upper and Lower GI      Family History  Problem Relation Age of Onset  . Alcohol abuse Father   . Colon cancer Father   . COPD Father   . Depression Daughter        acute  . Sexual abuse Daughter   . Lung disease Mother   . COPD Mother   . Thyroid disease  Mother   . Alcohol abuse Paternal Uncle   . Colon cancer Daughter 79  . ADD / ADHD Neg Hx   . Anxiety disorder Neg Hx   . OCD Neg Hx   . Drug abuse Neg Hx   . Bipolar disorder Neg Hx   . Dementia Neg Hx   . Paranoid behavior Neg Hx   . Schizophrenia Neg Hx   . Seizures Neg Hx   . Physical abuse Neg Hx     Social History   Socioeconomic History  . Marital status: Married    Spouse name: Not on file  . Number of children: Not on file  . Years of education: Not on file  . Highest education level: Not on file  Occupational History  . Not on file  Social Needs  . Financial resource strain: Not on file  . Food insecurity    Worry: Not on file    Inability: Not on file  . Transportation needs    Medical: Not on file    Non-medical: Not on file  Tobacco Use  . Smoking status: Never Smoker  . Smokeless tobacco: Never Used  Substance and Sexual Activity  . Alcohol use: Yes    Alcohol/week: 3.0 standard drinks    Types: 3 Cans of beer per week  . Drug use:  No  . Sexual activity: Yes    Birth control/protection: Surgical  Lifestyle  . Physical activity    Days per week: Not on file    Minutes per session: Not on file  . Stress: Not on file  Relationships  . Social Herbalist on phone: Not on file    Gets together: Not on file    Attends religious service: Not on file    Active member of club or organization: Not on file    Attends meetings of clubs or organizations: Not on file    Relationship status: Not on file  . Intimate partner violence    Fear of current or ex partner: Not on file    Emotionally abused: Not on file    Physically abused: Not on file    Forced sexual activity: Not on file  Other Topics Concern  . Not on file  Social History Narrative  . Not on file      ROS:  General: Negative for anorexia, weight loss, fever, chills, fatigue, weakness. Eyes: Negative for vision changes.  ENT: Negative for hoarseness, difficulty swallowing ,  nasal congestion. CV: Negative for chest pain, angina, palpitations, dyspnea on exertion, peripheral edema.  Respiratory: Negative for dyspnea at rest, dyspnea on exertion, cough, sputum, wheezing.  GI: See history of present illness. GU:  Negative for dysuria, hematuria, urinary incontinence, urinary frequency, nocturnal urination.  MS: Negative for joint pain, low back pain.  Derm: Negative for rash or itching.  Neuro: Negative for weakness, abnormal sensation, seizure, frequent headaches, memory loss, confusion.  Psych: Negative for anxiety, depression, suicidal ideation, hallucinations.  Endo: Negative for unusual weight change.  Heme: Negative for bruising or bleeding. Allergy: Negative for rash or hives.    Physical Examination:  There were no vitals taken for this visit.   General: Well-nourished, well-developed in no acute distress.  Head: Normocephalic, atraumatic.   Eyes: Conjunctiva pink, no icterus. Mouth: Oropharyngeal mucosa moist and pink , no lesions erythema or exudate. Neck: Supple without thyromegaly, masses, or lymphadenopathy.  Lungs: Clear to auscultation bilaterally.  Heart: Regular rate and rhythm, no murmurs rubs or gallops.  Abdomen: Bowel sounds are normal, nontender, nondistended, no hepatosplenomegaly or masses, no abdominal bruits or    hernia , no rebound or guarding.   Rectal: *** Extremities: No lower extremity edema. No clubbing or deformities.  Neuro: Alert and oriented x 4 , grossly normal neurologically.  Skin: Warm and dry, no rash or jaundice.   Psych: Alert and cooperative, normal mood and affect.  Labs: ***  Imaging Studies: No results found.

## 2019-07-23 ENCOUNTER — Ambulatory Visit: Payer: Medicare HMO | Admitting: Gastroenterology

## 2019-08-09 ENCOUNTER — Other Ambulatory Visit (HOSPITAL_COMMUNITY): Payer: Self-pay | Admitting: Psychiatry

## 2019-08-11 ENCOUNTER — Other Ambulatory Visit: Payer: Self-pay | Admitting: *Deleted

## 2019-08-11 ENCOUNTER — Encounter: Payer: Self-pay | Admitting: Gastroenterology

## 2019-08-11 ENCOUNTER — Ambulatory Visit: Payer: Medicare HMO | Admitting: Gastroenterology

## 2019-08-11 ENCOUNTER — Other Ambulatory Visit: Payer: Self-pay

## 2019-08-11 ENCOUNTER — Encounter: Payer: Self-pay | Admitting: *Deleted

## 2019-08-11 DIAGNOSIS — K5900 Constipation, unspecified: Secondary | ICD-10-CM | POA: Diagnosis not present

## 2019-08-11 DIAGNOSIS — Z800 Family history of malignant neoplasm of digestive organs: Secondary | ICD-10-CM

## 2019-08-11 MED ORDER — PEG 3350-KCL-NA BICARB-NACL 420 G PO SOLR: ORAL | 0 refills | Status: DC

## 2019-08-11 MED ORDER — LINACLOTIDE 145 MCG PO CAPS: 145.0000 ug | ORAL_CAPSULE | Freq: Every day | ORAL | 3 refills | Status: DC

## 2019-08-11 NOTE — Assessment & Plan Note (Signed)
Mild constipation.  Recommend beginning Linzess 145 mcg daily on an empty stomach.  If prescription is too expensive, then went back down to MiraLAX 17 g daily.  Important that he has regular soft bowel movements especially prior colonoscopy.  Patient was given written handout with instructions.  He has some vague abdominal bloating after meals, vague nausea.  States his reflux is controlled on pantoprazole.  Will address his constipation concerns, may need further evaluation based on where his symptoms settle out.

## 2019-08-11 NOTE — Patient Instructions (Signed)
1. For constipation, start Linzess 149mcg daily before breakfast.  Prescription sent to your pharmacy.  If the prescription is expensive, please do not purchase.  You can instead start MiraLAX 17 g daily which can be bought over-the-counter. 2. Discontinue Colace once she start Linzess or MiraLAX. 3. It is important to get your bowels soft and regular especially prior to colonoscopy. 4. Colonoscopy as scheduled.  Please see separate instructions.

## 2019-08-11 NOTE — Progress Notes (Signed)
Primary Care Physician:  Celene Squibb, MD  Primary Gastroenterologist:  Garfield Cornea, MD   Chief Complaint  Patient presents with  . Colonoscopy    consult  . Constipation    HPI:  Greg Kemp is a 58 y.o. male here to schedule colonoscopy.  His last colonoscopy was in 2010, he had colonic diverticulosis, normal TI.  Also had a EGD at that time with ulcerative/erosive reflux esophagitis, noncritical Schatzki ring, multiple bulbar erosions with normal small bowel biopsies.  Patient's family history significant for father having colon cancer, daughter deceased at age 75 due to complications of chemotherapy for metastatic colon cancer was diagnosed at age 72.    Patient states that since we last saw him he was diagnosed with pseudodementia.  He has memory issues.  He has gained about 25 pounds since Covid.  Has developed some constipation.  Believes is related to his medications. BM several times per week. Alternate between hard and regular. No melena, brbpr. Takes colace sometimes but not regular. Feels like food "sticks with me too long", feels bloated. Some occasional nausea. No heartburn. No dysphagia.    Current Outpatient Medications  Medication Sig Dispense Refill  . ALPRAZolam (XANAX) 1 MG tablet Take 1 tablet (1 mg total) by mouth 4 (four) times daily. 120 tablet 2  . benztropine (COGENTIN) 0.5 MG tablet Take 1 tablet by mouth 2 (two) times daily.    Marland Kitchen docusate sodium (COLACE) 100 MG capsule Take 100 mg by mouth as needed for mild constipation.    . DULoxetine (CYMBALTA) 60 MG capsule Take 1 capsule (60 mg total) by mouth daily. 90 capsule 2  . levothyroxine (SYNTHROID) 175 MCG tablet Take 175 mcg by mouth daily.    . pantoprazole (PROTONIX) 40 MG tablet Take 40 mg by mouth daily.    . Potassium (POTASSIMIN PO) Take by mouth daily.    . traZODone (DESYREL) 100 MG tablet Take 1 tablet (100 mg total) by mouth at bedtime. 90 tablet 2   No current facility-administered medications  for this visit.     Allergies as of 08/11/2019  . (No Known Allergies)    Past Medical History:  Diagnosis Date  . Acid reflux   . Anxiety   . Chronic kidney disease    Stage III  . Depression   . Depression   . GERD (gastroesophageal reflux disease)   . Hyperlipidemia   . Morbid obesity ([place])   . Thyroid disease     Past Surgical History:  Procedure Laterality Date  . COLONOSCOPY WITH ESOPHAGOGASTRODUODENOSCOPY (EGD)  2010   Dr. Gala Romney: ulcerative/erosive reflux esophagitis, noncritical schatki's ring, multiple bulbar erosions with normal sb biopsies, colonic diverticulosis, normal TI  . TONSILLECTOMY    . Upper and Lower GI      Family History  Problem Relation Age of Onset  . Alcohol abuse Father   . Colon cancer Father   . COPD Father   . Depression Daughter        acute  . Sexual abuse Daughter   . Lung disease Mother   . COPD Mother   . Thyroid disease Mother   . Alcohol abuse Paternal Uncle   . Colon cancer Daughter 63  . ADD / ADHD Neg Hx   . Anxiety disorder Neg Hx   . OCD Neg Hx   . Drug abuse Neg Hx   . Bipolar disorder Neg Hx   . Dementia Neg Hx   . Paranoid behavior Neg Hx   .  Schizophrenia Neg Hx   . Seizures Neg Hx   . Physical abuse Neg Hx     Social History   Socioeconomic History  . Marital status: Married    Spouse name: Not on file  . Number of children: Not on file  . Years of education: Not on file  . Highest education level: Not on file  Occupational History  . Not on file  Social Needs  . Financial resource strain: Not on file  . Food insecurity    Worry: Not on file    Inability: Not on file  . Transportation needs    Medical: Not on file    Non-medical: Not on file  Tobacco Use  . Smoking status: Never Smoker  . Smokeless tobacco: Never Used  Substance and Sexual Activity  . Alcohol use: Yes    Comment: occ  . Drug use: No  . Sexual activity: Yes    Birth control/protection: Surgical  Lifestyle  . Physical  activity    Days per week: Not on file    Minutes per session: Not on file  . Stress: Not on file  Relationships  . Social Herbalist on phone: Not on file    Gets together: Not on file    Attends religious service: Not on file    Active member of club or organization: Not on file    Attends meetings of clubs or organizations: Not on file    Relationship status: Not on file  . Intimate partner violence    Fear of current or ex partner: Not on file    Emotionally abused: Not on file    Physically abused: Not on file    Forced sexual activity: Not on file  Other Topics Concern  . Not on file  Social History Narrative  . Not on file      ROS:  General: Negative for anorexia, weight loss, fever, chills, fatigue, weakness. Eyes: Negative for vision changes.  ENT: Negative for hoarseness, difficulty swallowing , nasal congestion. CV: Negative for chest pain, angina, palpitations, dyspnea on exertion, peripheral edema.  Respiratory: Negative for dyspnea at rest, dyspnea on exertion, cough, sputum, wheezing.  GI: See history of present illness. GU:  Negative for dysuria, hematuria, urinary incontinence, urinary frequency, nocturnal urination.  MS: Negative for joint pain, low back pain.  Derm: Negative for rash or itching.  Neuro: Negative for weakness, abnormal sensation, seizure, frequent headaches, positive memory loss, confusion.  Psych: Negative for anxiety, depression, suicidal ideation, hallucinations.  Endo: Negative for unusual weight change.  Heme: Negative for bruising or bleeding. Allergy: Negative for rash or hives.    Physical Examination:  BP (!) 148/83   Pulse 87   Temp (!) 96.6 F (35.9 C) (Temporal)   Ht 5\' 11"  (1.803 m)   Wt (!) 341 lb 12.8 oz (155 kg)   BMI 47.67 kg/m    General: Well-nourished, well-developed in no acute distress.  Morbidly obese  head: Normocephalic, atraumatic.   Eyes: Conjunctiva pink, no icterus. Mouth: Oropharyngeal  mucosa moist and pink , no lesions erythema or exudate. Neck: Supple without thyromegaly, masses, or lymphadenopathy.  Lungs: Clear to auscultation bilaterally.  Heart: Regular rate and rhythm, no murmurs rubs or gallops.  Abdomen: Bowel sounds are normal, nontender, nondistended, no hepatosplenomegaly or masses, no abdominal bruits or    hernia , no rebound or guarding.   Rectal: Deferred Extremities: No lower extremity edema. No clubbing or deformities.  Neuro: Alert  and oriented x 4 , grossly normal neurologically.  Skin: Warm and dry, no rash or jaundice.   Psych: Alert and cooperative, normal mood and affect.    Imaging Studies: No results found.

## 2019-08-11 NOTE — Assessment & Plan Note (Signed)
Overdue for high risk reading colonoscopy.  Plan for deep sedation given polypharmacy and morbid obesity.  I have discussed the risks, alternatives, benefits with regards to but not limited to the risk of reaction to medication, bleeding, infection, perforation and the patient is agreeable to proceed. Written consent to be obtained.

## 2019-08-21 ENCOUNTER — Encounter (HOSPITAL_COMMUNITY): Payer: Self-pay | Admitting: Psychiatry

## 2019-08-21 ENCOUNTER — Other Ambulatory Visit: Payer: Self-pay

## 2019-08-21 ENCOUNTER — Ambulatory Visit (INDEPENDENT_AMBULATORY_CARE_PROVIDER_SITE_OTHER): Payer: Medicare HMO | Admitting: Psychiatry

## 2019-08-21 DIAGNOSIS — F332 Major depressive disorder, recurrent severe without psychotic features: Secondary | ICD-10-CM

## 2019-08-21 DIAGNOSIS — F419 Anxiety disorder, unspecified: Secondary | ICD-10-CM | POA: Diagnosis not present

## 2019-08-21 MED ORDER — ALPRAZOLAM 1 MG PO TABS: 1.0000 mg | ORAL_TABLET | Freq: Four times a day (QID) | ORAL | 2 refills | Status: DC

## 2019-08-21 MED ORDER — DULOXETINE HCL 60 MG PO CPEP: 60.0000 mg | ORAL_CAPSULE | Freq: Every day | ORAL | 2 refills | Status: DC

## 2019-08-21 MED ORDER — BENZTROPINE MESYLATE 0.5 MG PO TABS: 0.5000 mg | ORAL_TABLET | Freq: Two times a day (BID) | ORAL | 2 refills | Status: DC

## 2019-08-21 MED ORDER — TRAZODONE HCL 100 MG PO TABS: 100.0000 mg | ORAL_TABLET | Freq: Every day | ORAL | 2 refills | Status: DC

## 2019-08-21 NOTE — Progress Notes (Signed)
Virtual Visit via Video Note  I connected with Greg Kemp on 08/21/19 at  1:00 PM EST by a video enabled telemedicine application and verified that I am speaking with the correct person using two identifiers.   I discussed the limitations of evaluation and management by telemedicine and the availability of in person appointments. The patient expressed understanding and agreed to proceed.    I discussed the assessment and treatment plan with the patient. The patient was provided an opportunity to ask questions and all were answered. The patient agreed with the plan and demonstrated an understanding of the instructions.   The patient was advised to call back or seek an in-person evaluation if the symptoms worsen or if the condition fails to improve as anticipated.  I provided 15 minutes of non-face-to-face time during this encounter.   [name], MD  [facility] MD/PA/NP OP Progress Note  08/21/2019 1:25 PM Greg Kemp  MRN:  [id number]  Chief Complaint:  Chief Complaint    Anxiety; Depression     HPI: This patient is a 58 year old male who lives with his wife in [place].  He is on disability.  The patient and his wife return for follow-up after 3 months regarding his depression posttraumatic stress disorder and anxiety.  He states that he is not been doing quite as well lately.  Follows a difficult time for him and as the cold weather approaches he becomes more sedentary somewhat more depressed.  He is just not been getting up and doing as much.  Because of coronavirus restrictions he is not been able to see his many family members and this is sad for him.  He is sleeping well.  At times he has panic attacks with his Xanax seems to help.  He denies any thoughts of suicide or self-harm and his affect is fairly bright.  His wife states that he did better when he was on lurasidone 60 mg so we will reinstate this via samples.  His insurance really does not cover it and he cannot afford  it. Visit Diagnosis:    ICD-10-CM   1. Major depressive disorder, recurrent, severe without psychotic features ([place])  F33.2   2. Anxiety  F41.9     Past Psychiatric History: Several past psychiatric hospitalizations but none since 2012  Past Medical History:  Past Medical History:  Diagnosis Date  . Acid reflux   . Anxiety   . Chronic kidney disease    Stage III  . Depression   . Depression   . GERD (gastroesophageal reflux disease)   . Hyperlipidemia   . Morbid obesity ([place])   . Thyroid disease     Past Surgical History:  Procedure Laterality Date  . COLONOSCOPY WITH ESOPHAGOGASTRODUODENOSCOPY (EGD)  2010   Dr. Gala Romney: ulcerative/erosive reflux esophagitis, noncritical schatki's ring, multiple bulbar erosions with normal sb biopsies, colonic diverticulosis, normal TI  . TONSILLECTOMY    . Upper and Lower GI      Family Psychiatric History: See below  Family History:  Family History  Problem Relation Age of Onset  . Alcohol abuse Father   . Colon cancer Father   . COPD Father   . Depression Daughter        acute  . Sexual abuse Daughter   . Lung disease Mother   . COPD Mother   . Thyroid disease Mother   . Alcohol abuse Paternal Uncle   . Colon cancer Daughter 26  . ADD / ADHD Neg Hx   .  Anxiety disorder Neg Hx   . OCD Neg Hx   . Drug abuse Neg Hx   . Bipolar disorder Neg Hx   . Dementia Neg Hx   . Paranoid behavior Neg Hx   . Schizophrenia Neg Hx   . Seizures Neg Hx   . Physical abuse Neg Hx     Social History:  Social History   Socioeconomic History  . Marital status: Married    Spouse name: Not on file  . Number of children: Not on file  . Years of education: Not on file  . Highest education level: Not on file  Occupational History  . Not on file  Social Needs  . Financial resource strain: Not on file  . Food insecurity    Worry: Not on file    Inability: Not on file  . Transportation needs    Medical: Not on file    Non-medical: Not on  file  Tobacco Use  . Smoking status: Never Smoker  . Smokeless tobacco: Never Used  Substance and Sexual Activity  . Alcohol use: Yes    Comment: occ  . Drug use: No  . Sexual activity: Yes    Birth control/protection: Surgical  Lifestyle  . Physical activity    Days per week: Not on file    Minutes per session: Not on file  . Stress: Not on file  Relationships  . Social Herbalist on phone: Not on file    Gets together: Not on file    Attends religious service: Not on file    Active member of club or organization: Not on file    Attends meetings of clubs or organizations: Not on file    Relationship status: Not on file  Other Topics Concern  . Not on file  Social History Narrative  . Not on file    Allergies: No Known Allergies  Metabolic Disorder Labs: No results found for: HGBA1C, MPG No results found for: PROLACTIN Lab Results  Component Value Date   CHOL  06/16/2010    166        ATP III CLASSIFICATION:  <200     mg/dL   Desirable  200-239  mg/dL   Borderline High  >=240    mg/dL   High          TRIG 166 (H) 06/16/2010   HDL 29 (L) 06/16/2010   CHOLHDL 5.7 06/16/2010   VLDL 33 06/16/2010   LDLCALC (H) 06/16/2010    104        Total Cholesterol/HDL:CHD Risk Coronary Heart Disease Risk Table                     Men   Women  1/2 Average Risk   3.4   3.3  Average Risk       5.0   4.4  2 X Average Risk   9.6   7.1  3 X Average Risk  23.4   11.0        Use the calculated Patient Ratio above and the CHD Risk Table to determine the patient's CHD Risk.        ATP III CLASSIFICATION (LDL):  <100     mg/dL   Optimal  100-129  mg/dL   Near or Above                    Optimal  130-159  mg/dL   Borderline  160-189  mg/dL  High  >190     mg/dL   Very High   Lab Results  Component Value Date   TSH 11.645 (H) 12/14/2010   TSH 0.264 (L) 06/16/2010    Therapeutic Level Labs: No results found for: LITHIUM No results found for: VALPROATE No  components found for:  CBMZ  Current Medications: Current Outpatient Medications  Medication Sig Dispense Refill  . ALPRAZolam (XANAX) 1 MG tablet Take 1 tablet (1 mg total) by mouth 4 (four) times daily. 120 tablet 2  . benztropine (COGENTIN) 0.5 MG tablet Take 1 tablet (0.5 mg total) by mouth 2 (two) times daily. 60 tablet 2  . docusate sodium (COLACE) 100 MG capsule Take 100 mg by mouth as needed for mild constipation.    . DULoxetine (CYMBALTA) 60 MG capsule Take 1 capsule (60 mg total) by mouth daily. 90 capsule 2  . levothyroxine (SYNTHROID) 175 MCG tablet Take 175 mcg by mouth daily.    Marland Kitchen linaclotide (LINZESS) 145 MCG CAPS capsule Take 1 capsule (145 mcg total) by mouth daily before breakfast. 30 capsule 3  . pantoprazole (PROTONIX) 40 MG tablet Take 40 mg by mouth daily.    . polyethylene glycol-electrolytes (NULYTELY/GOLYTELY) 420 g solution As directed 4000 mL 0  . Potassium (POTASSIMIN PO) Take by mouth daily.    . traZODone (DESYREL) 100 MG tablet Take 1 tablet (100 mg total) by mouth at bedtime. 90 tablet 2   No current facility-administered medications for this visit.      Musculoskeletal: Strength & Muscle Tone: within normal limits Gait & Station: normal Patient leans: N/A  Psychiatric Specialty Exam: Review of Systems  Constitutional: Positive for malaise/fatigue.  Psychiatric/Behavioral: Positive for depression.  All other systems reviewed and are negative.   There were no vitals taken for this visit.There is no height or weight on file to calculate BMI.  General Appearance: Casual and Fairly Groomed  Eye Contact:  Good  Speech:  Clear and Coherent  Volume:  Normal  Mood:  Anxious and Dysphoric  Affect:  Appropriate and Congruent  Thought Process:  Goal Directed  Orientation:  Full (Time, Place, and Person)  Thought Content: Rumination   Suicidal Thoughts:  No  Homicidal Thoughts:  No  Memory:  Immediate;   Good Recent;   Good Remote;   Fair  Judgement:   Good  Insight:  Fair  Psychomotor Activity:  Decreased  Concentration:  Concentration: Good and Attention Span: Fair  Recall:  [employer] of Knowledge: Fair  Language: Good  Akathisia:  No  Handed:  Right  AIMS (if indicated): not done  Assets:  Communication Skills Desire for Improvement Resilience Social Support Talents/Skills  ADL's:  Intact  Cognition: WNL  Sleep:  Good   Screenings:   Assessment and Plan: This patient is a 58 year old male with a history of posttraumatic stress disorder depression and anxiety.  His family thinks he did better when he was on a mood stabilizer.  Therefore we will reinstate Latuda 60 mg daily.  He will continue Cymbalta 60 mg daily for depression trazodone 100 mg at bedtime for sleep, Xanax 1 mg up to 4 times daily for anxiety and Cogentin 0.5 mg twice daily for tremor.  He will return to see me in 2 months   [name], MD 08/21/2019, 1:25 PM

## 2019-08-29 DIAGNOSIS — Z0001 Encounter for general adult medical examination with abnormal findings: Secondary | ICD-10-CM | POA: Diagnosis not present

## 2019-08-29 DIAGNOSIS — E785 Hyperlipidemia, unspecified: Secondary | ICD-10-CM | POA: Diagnosis not present

## 2019-08-29 DIAGNOSIS — R7301 Impaired fasting glucose: Secondary | ICD-10-CM | POA: Diagnosis not present

## 2019-08-29 DIAGNOSIS — F411 Generalized anxiety disorder: Secondary | ICD-10-CM | POA: Diagnosis not present

## 2019-08-29 DIAGNOSIS — E039 Hypothyroidism, unspecified: Secondary | ICD-10-CM | POA: Diagnosis not present

## 2019-08-29 DIAGNOSIS — F3181 Bipolar II disorder: Secondary | ICD-10-CM | POA: Diagnosis not present

## 2019-08-29 DIAGNOSIS — R110 Nausea: Secondary | ICD-10-CM | POA: Diagnosis not present

## 2019-08-29 DIAGNOSIS — N1832 Chronic kidney disease, stage 3b: Secondary | ICD-10-CM | POA: Diagnosis not present

## 2019-08-29 DIAGNOSIS — R109 Unspecified abdominal pain: Secondary | ICD-10-CM | POA: Diagnosis not present

## 2019-10-13 DIAGNOSIS — N1832 Chronic kidney disease, stage 3b: Secondary | ICD-10-CM | POA: Diagnosis not present

## 2019-10-13 DIAGNOSIS — E039 Hypothyroidism, unspecified: Secondary | ICD-10-CM | POA: Diagnosis not present

## 2019-10-13 DIAGNOSIS — F411 Generalized anxiety disorder: Secondary | ICD-10-CM | POA: Diagnosis not present

## 2019-10-13 DIAGNOSIS — R110 Nausea: Secondary | ICD-10-CM | POA: Diagnosis not present

## 2019-10-13 DIAGNOSIS — Z0001 Encounter for general adult medical examination with abnormal findings: Secondary | ICD-10-CM | POA: Diagnosis not present

## 2019-10-13 DIAGNOSIS — E785 Hyperlipidemia, unspecified: Secondary | ICD-10-CM | POA: Diagnosis not present

## 2019-10-13 DIAGNOSIS — F3181 Bipolar II disorder: Secondary | ICD-10-CM | POA: Diagnosis not present

## 2019-10-13 DIAGNOSIS — R7301 Impaired fasting glucose: Secondary | ICD-10-CM | POA: Diagnosis not present

## 2019-10-13 DIAGNOSIS — R109 Unspecified abdominal pain: Secondary | ICD-10-CM | POA: Diagnosis not present

## 2019-10-28 ENCOUNTER — Other Ambulatory Visit: Payer: Self-pay

## 2019-10-28 ENCOUNTER — Other Ambulatory Visit (HOSPITAL_COMMUNITY)
Admission: RE | Admit: 2019-10-28 | Discharge: 2019-10-28 | Disposition: A | Discharge status: Home / Self Care | Payer: Medicare HMO | Source: Ambulatory Visit | Attending: Internal Medicine | Admitting: Internal Medicine

## 2019-10-28 ENCOUNTER — Encounter (HOSPITAL_COMMUNITY): Payer: Self-pay

## 2019-10-28 ENCOUNTER — Encounter (HOSPITAL_COMMUNITY)
Admission: RE | Admit: 2019-10-28 | Discharge: 2019-10-28 | Disposition: A | Discharge status: Home / Self Care | Payer: Medicare HMO | Source: Ambulatory Visit | Attending: Internal Medicine | Admitting: Internal Medicine

## 2019-10-28 DIAGNOSIS — Z01812 Encounter for preprocedural laboratory examination: Secondary | ICD-10-CM | POA: Diagnosis not present

## 2019-10-28 HISTORY — DX: Sleep apnea, unspecified: G47.30

## 2019-10-28 LAB — BASIC METABOLIC PANEL
Anion gap: 6 (ref 5–15)
BUN: 8 mg/dL (ref 6–20)
CO2: 27 mmol/L (ref 22–32)
Calcium: 8.3 mg/dL — ABNORMAL LOW (ref 8.9–10.3)
Chloride: 104 mmol/L (ref 98–111)
Creatinine, Ser: 1.25 mg/dL — ABNORMAL HIGH (ref 0.61–1.24)
GFR calc Af Amer: >60 mL/min (ref >60)
GFR calc non Af Amer: >60 mL/min (ref >60)
Glucose, Bld: 108 mg/dL — ABNORMAL HIGH (ref 70–99)
Potassium: 3.6 mmol/L (ref 3.5–5.1)
Sodium: 137 mmol/L (ref 135–145)

## 2019-10-28 LAB — SARS CORONAVIRUS 2 (TAT 6-24 HRS): SARS Coronavirus 2: NEGATIVE

## 2019-10-28 NOTE — Progress Notes (Signed)
   10/28/19 1046  OBSTRUCTIVE SLEEP APNEA  Have you ever been diagnosed with sleep apnea through a sleep study? No (could not complete the sleep study)  Do you snore loudly (loud enough to be heard through closed doors)?  1  Do you often feel tired, fatigued, or sleepy during the daytime (such as falling asleep during driving or talking to someone)? 0  Has anyone observed you stop breathing during your sleep? 1  Do you have, or are you being treated for high blood pressure? 0  BMI more than 35 kg/m2? 1  Age > 50 (1-yes) 1  Neck circumference greater than:Male 16 inches or larger, Male 17inches or larger? 1  Male Gender (Yes=1) 1  Obstructive Sleep Apnea Score 6

## 2019-10-28 NOTE — Patient Instructions (Signed)
TRUMAN TUDISCO  10/28/2019     @PREFPERIOPPHARMACY @   Your procedure is scheduled on  10/30/2019 .  Report to [facility] at  1200  P.M.  Call this number if you have problems the morning of surgery:  [phone]   Remember:  Follow the diet and prep instructions given to you by Dr Roseanne Kaufman office.                      Take these medicines the morning of surgery with A SIP OF WATER  Cymbalta, synthroid, protonix, trazadone.    Do not wear jewelry, make-up or nail polish.  Do not wear lotions, powders, or perfumes. Please brush your teeth and wear deodorant.  Do not shave 48 hours prior to surgery.  Men may shave face and neck.  Do not bring valuables to the hospital.  [facility] is not responsible for any belongings or valuables.  Contacts, dentures or bridgework may not be worn into surgery.  Leave your suitcase in the car.  After surgery it may be brought to your room.  For patients admitted to the hospital, discharge time will be determined by your treatment team.  Patients discharged the day of surgery will not be allowed to drive home.   Name and phone number of your driver:   family Special instructions:  None  Please read over the following fact sheets that you were given. Anesthesia Post-op Instructions and Care and Recovery After Surgery       Colonoscopy, Adult, Care After This sheet gives you information about how to care for yourself after your procedure. Your health care provider may also give you more specific instructions. If you have problems or questions, contact your health care provider. What can I expect after the procedure? After the procedure, it is common to have:  A small amount of blood in your stool for 24 hours after the procedure.  Some gas.  Mild cramping or bloating of your abdomen. Follow these instructions at home: Eating and drinking   Drink enough fluid to keep your urine pale yellow.  Follow instructions from your health  care provider about eating or drinking restrictions.  Resume your normal diet as instructed by your health care provider. Avoid heavy or fried foods that are hard to digest. Activity  Rest as told by your health care provider.  Avoid sitting for a long time without moving. Get up to take short walks every 1-2 hours. This is important to improve blood flow and breathing. Ask for help if you feel weak or unsteady.  Return to your normal activities as told by your health care provider. Ask your health care provider what activities are safe for you. Managing cramping and bloating   Try walking around when you have cramps or feel bloated.  Apply heat to your abdomen as told by your health care provider. Use the heat source that your health care provider recommends, such as a moist heat pack or a heating pad. ? Place a towel between your skin and the heat source. ? Leave the heat on for 20-30 minutes. ? Remove the heat if your skin turns bright red. This is especially important if you are unable to feel pain, heat, or cold. You may have a greater risk of getting burned. General instructions  For the first 24 hours after the procedure: ? Do not drive or use machinery. ? Do not sign important documents. ? Do  not drink alcohol. ? Do your regular daily activities at a slower pace than normal. ? Eat soft foods that are easy to digest.  Take over-the-counter and prescription medicines only as told by your health care provider.  Keep all follow-up visits as told by your health care provider. This is important. Contact a health care provider if:  You have blood in your stool 2-3 days after the procedure. Get help right away if you have:  More than a small spotting of blood in your stool.  Large blood clots in your stool.  Swelling of your abdomen.  Nausea or vomiting.  A fever.  Increasing pain in your abdomen that is not relieved with medicine. Summary  After the procedure, it is  common to have a small amount of blood in your stool. You may also have mild cramping and bloating of your abdomen.  For the first 24 hours after the procedure, do not drive or use machinery, sign important documents, or drink alcohol.  Get help right away if you have a lot of blood in your stool, nausea or vomiting, a fever, or increased pain in your abdomen. This information is not intended to replace advice given to you by your health care provider. Make sure you discuss any questions you have with your health care provider. Document Revised: 04/21/2019 Document Reviewed: 04/21/2019 Elsevier Patient Education  [place] After These instructions provide you with information about caring for yourself after your procedure. Your health care provider may also give you more specific instructions. Your treatment has been planned according to current medical practices, but problems sometimes occur. Call your health care provider if you have any problems or questions after your procedure. What can I expect after the procedure? After your procedure, you may:  Feel sleepy for several hours.  Feel clumsy and have poor balance for several hours.  Feel forgetful about what happened after the procedure.  Have poor judgment for several hours.  Feel nauseous or vomit.  Have a sore throat if you had a breathing tube during the procedure. Follow these instructions at home: For at least 24 hours after the procedure:      Have a responsible adult stay with you. It is important to have someone help care for you until you are awake and alert.  Rest as needed.  Do not: ? Participate in activities in which you could fall or become injured. ? Drive. ? Use heavy machinery. ? Drink alcohol. ? Take sleeping pills or medicines that cause drowsiness. ? Make important decisions or sign legal documents. ? Take care of children on your own. Eating and  drinking  Follow the diet that is recommended by your health care provider.  If you vomit, drink water, juice, or soup when you can drink without vomiting.  Make sure you have little or no nausea before eating solid foods. General instructions  Take over-the-counter and prescription medicines only as told by your health care provider.  If you have sleep apnea, surgery and certain medicines can increase your risk for breathing problems. Follow instructions from your health care provider about wearing your sleep device: ? Anytime you are sleeping, including during daytime naps. ? While taking prescription pain medicines, sleeping medicines, or medicines that make you drowsy.  If you smoke, do not smoke without supervision.  Keep all follow-up visits as told by your health care provider. This is important. Contact a health care provider if:  You keep feeling  nauseous or you keep vomiting.  You feel light-headed.  You develop a rash.  You have a fever. Get help right away if:  You have trouble breathing. Summary  For several hours after your procedure, you may feel sleepy and have poor judgment.  Have a responsible adult stay with you for at least 24 hours or until you are awake and alert. This information is not intended to replace advice given to you by your health care provider. Make sure you discuss any questions you have with your health care provider. Document Revised: 12/24/2017 Document Reviewed: 01/16/2016 Elsevier Patient Education  [place].

## 2019-10-30 ENCOUNTER — Other Ambulatory Visit: Payer: Self-pay

## 2019-10-30 ENCOUNTER — Ambulatory Visit (HOSPITAL_COMMUNITY): Payer: Medicare HMO | Admitting: Anesthesiology

## 2019-10-30 ENCOUNTER — Ambulatory Visit (HOSPITAL_COMMUNITY)
Admission: RE | Admit: 2019-10-30 | Discharge: 2019-10-30 | Disposition: A | Discharge status: Home / Self Care | Payer: Medicare HMO | Attending: Internal Medicine | Admitting: Internal Medicine

## 2019-10-30 ENCOUNTER — Encounter (HOSPITAL_COMMUNITY): Payer: Self-pay | Admitting: Internal Medicine

## 2019-10-30 ENCOUNTER — Encounter (HOSPITAL_COMMUNITY)
Admission: RE | Disposition: A | Discharge status: Home / Self Care | Payer: Self-pay | Source: Home / Self Care | Attending: Internal Medicine

## 2019-10-30 DIAGNOSIS — K635 Polyp of colon: Secondary | ICD-10-CM | POA: Diagnosis not present

## 2019-10-30 DIAGNOSIS — D123 Benign neoplasm of transverse colon: Secondary | ICD-10-CM | POA: Insufficient documentation

## 2019-10-30 DIAGNOSIS — Z811 Family history of alcohol abuse and dependence: Secondary | ICD-10-CM | POA: Insufficient documentation

## 2019-10-30 DIAGNOSIS — Z79899 Other long term (current) drug therapy: Secondary | ICD-10-CM | POA: Diagnosis not present

## 2019-10-30 DIAGNOSIS — Z1211 Encounter for screening for malignant neoplasm of colon: Secondary | ICD-10-CM | POA: Insufficient documentation

## 2019-10-30 DIAGNOSIS — D120 Benign neoplasm of cecum: Secondary | ICD-10-CM | POA: Insufficient documentation

## 2019-10-30 DIAGNOSIS — E785 Hyperlipidemia, unspecified: Secondary | ICD-10-CM | POA: Insufficient documentation

## 2019-10-30 DIAGNOSIS — N183 Chronic kidney disease, stage 3 unspecified: Secondary | ICD-10-CM | POA: Diagnosis not present

## 2019-10-30 DIAGNOSIS — G4730 Sleep apnea, unspecified: Secondary | ICD-10-CM | POA: Diagnosis not present

## 2019-10-30 DIAGNOSIS — E039 Hypothyroidism, unspecified: Secondary | ICD-10-CM | POA: Diagnosis not present

## 2019-10-30 DIAGNOSIS — Z800 Family history of malignant neoplasm of digestive organs: Secondary | ICD-10-CM | POA: Diagnosis not present

## 2019-10-30 DIAGNOSIS — D128 Benign neoplasm of rectum: Secondary | ICD-10-CM | POA: Diagnosis not present

## 2019-10-30 DIAGNOSIS — Z8349 Family history of other endocrine, nutritional and metabolic diseases: Secondary | ICD-10-CM | POA: Insufficient documentation

## 2019-10-30 DIAGNOSIS — F419 Anxiety disorder, unspecified: Secondary | ICD-10-CM | POA: Insufficient documentation

## 2019-10-30 DIAGNOSIS — K219 Gastro-esophageal reflux disease without esophagitis: Secondary | ICD-10-CM | POA: Insufficient documentation

## 2019-10-30 DIAGNOSIS — K621 Rectal polyp: Secondary | ICD-10-CM

## 2019-10-30 DIAGNOSIS — Z825 Family history of asthma and other chronic lower respiratory diseases: Secondary | ICD-10-CM | POA: Insufficient documentation

## 2019-10-30 DIAGNOSIS — F319 Bipolar disorder, unspecified: Secondary | ICD-10-CM | POA: Diagnosis not present

## 2019-10-30 DIAGNOSIS — Z818 Family history of other mental and behavioral disorders: Secondary | ICD-10-CM | POA: Insufficient documentation

## 2019-10-30 HISTORY — PX: POLYPECTOMY: SHX5525

## 2019-10-30 HISTORY — PX: COLONOSCOPY WITH PROPOFOL: SHX5780

## 2019-10-30 SURGERY — COLONOSCOPY WITH PROPOFOL
Anesthesia: General

## 2019-10-30 MED ORDER — PROPOFOL 10 MG/ML IV BOLUS
INTRAVENOUS | Status: DC | PRN
  Administered 2019-10-30 (×2): 20 mg via INTRAVENOUS

## 2019-10-30 MED ORDER — PROPOFOL 500 MG/50ML IV EMUL
INTRAVENOUS | Status: DC | PRN
  Administered 2019-10-30: 125 ug/kg/min via INTRAVENOUS

## 2019-10-30 MED ORDER — CHLORHEXIDINE GLUCONATE CLOTH 2 % EX PADS: 6.0000 | MEDICATED_PAD | Freq: Once | CUTANEOUS | Status: DC

## 2019-10-30 MED ORDER — KETAMINE HCL 50 MG/5ML IJ SOSY
PREFILLED_SYRINGE | INTRAMUSCULAR | Status: AC
  Filled 2019-10-30: qty 5

## 2019-10-30 MED ORDER — LACTATED RINGERS IV SOLN
Freq: Once | INTRAVENOUS | Status: AC
  Administered 2019-10-30: 13:00:00 1000 mL via INTRAVENOUS

## 2019-10-30 MED ORDER — KETAMINE HCL 10 MG/ML IJ SOLN
INTRAMUSCULAR | Status: DC | PRN
  Administered 2019-10-30: 20 mg via INTRAVENOUS
  Administered 2019-10-30: 10 mg via INTRAVENOUS

## 2019-10-30 NOTE — Op Note (Signed)
[facility] Patient Name: Greg Kemp Procedure Date: 10/30/2019 12:51 PM MRN: [id number] Date of Birth: January 03, 1961 Attending MD: Norvel Richards , MD CSN: [id number] Age: 59 Admit Type: Outpatient Procedure:                Colonoscopy Indications:              Screening for colorectal malignant neoplasm Providers:                Norvel Richards, MD, Janeece Riggers, RN, Aram Candela Referring MD:              Medicines:                Propofol per Anesthesia Complications:            No immediate complications. Estimated Blood Loss:     Estimated blood loss was minimal. Procedure:                Pre-Anesthesia Assessment:                           - Prior to the procedure, a History and Physical                            was performed, and patient medications and                            allergies were reviewed. The patient's tolerance of                            previous anesthesia was also reviewed. The risks                            and benefits of the procedure and the sedation                            options and risks were discussed with the patient.                            All questions were answered, and informed consent                            was obtained. Prior Anticoagulants: The patient has                            taken no previous anticoagulant or antiplatelet                            agents. ASA Grade Assessment: II - A patient with                            mild systemic disease. After reviewing the risks  and benefits, the patient was deemed in                            satisfactory condition to undergo the procedure.                           After obtaining informed consent, the colonoscope                            was passed under direct vision. Throughout the                            procedure, the patient's blood pressure, pulse, and                            oxygen  saturations were monitored continuously. The                            [id number] scope was introduced through                            the anus and advanced to the the cecum, identified                            by appendiceal orifice and ileocecal valve. The                            colonoscopy was performed without difficulty. The                            patient tolerated the procedure well. The quality                            of the bowel preparation was adequate. Scope In: 1:16:40 PM Scope Out: 1:40:51 PM Scope Withdrawal Time: 0 hours 13 minutes 36 seconds  Total Procedure Duration: 0 hours 24 minutes 11 seconds  Findings:      The perianal and digital rectal examinations were normal.      Six sessile polyps were found in the rectum, splenic flexure and cecum.       The polyps were 5 to 11 mm in size. The single 11 mm polyp removed with       hot snare; all others cold snare. Resection and retrieval were complete.       Estimated blood loss was minimal.      The exam was otherwise without abnormality on direct and retroflexion       views. Impression:               - Six 5 to 7 mm polyps in the rectum, at the                            splenic flexure and in the cecum, removed with a                            cold snare.  Resected and retrieved.                           - The examination was otherwise normal on direct                            and retroflexion views. Moderate Sedation:      Moderate (conscious) sedation was administered by the endoscopy nurse       and supervised by the endoscopist. The following parameters were       monitored: oxygen saturation, heart rate, blood pressure, respiratory       rate, EKG, adequacy of pulmonary ventilation, and response to care. Recommendation:           - Patient has a contact number available for                            emergencies. The signs and symptoms of potential                            delayed  complications were discussed with the                            patient. Return to normal activities tomorrow.                            Written discharge instructions were provided to the                            patient.                           - Resume previous diet.                           - Continue present medications.                           - Await pathology results.                           - Repeat colonoscopy date to be determined after                            pending pathology results are reviewed for                            surveillance.                           - Return to GI office (date not yet determined). Procedure Code(s):        --- Professional ---                           [phone], Colonoscopy, flexible; with removal of  tumor(s), polyp(s), or other lesion(s) by snare                            technique Diagnosis Code(s):        --- Professional ---                           Z12.11, Encounter for screening for malignant                            neoplasm of colon                           K62.1, Rectal polyp                           K63.5, Polyp of colon CPT copyright 2019 American [employer]. All rights reserved. The codes documented in this report are preliminary and upon coder review may  be revised to meet current compliance requirements. Cristopher Estimable. Samvel Zinn, MD Norvel Richards, MD 10/30/2019 1:52:19 PM This report has been signed electronically. Number of Addenda: 0

## 2019-10-30 NOTE — Transfer of Care (Signed)
Immediate Anesthesia Transfer of Care Note  Patient: Greg Kemp  Procedure(s) Performed: COLONOSCOPY WITH PROPOFOL (N/A ) POLYPECTOMY  Patient Location: PACU  Anesthesia Type:General  Level of Consciousness: awake  Airway & Oxygen Therapy: Patient Spontanous Breathing  Post-op Assessment: Report given to RN  Post vital signs: Reviewed and stable  Last Vitals:  Vitals Value Taken Time  BP 125/68 10/30/19 1353  Temp 36.1 C 10/30/19 1353  Pulse 72 10/30/19 1354  Resp 18 10/30/19 1354  SpO2 96 % 10/30/19 1354  Vitals shown include unvalidated device data.  Last Pain:  Vitals:   10/30/19 1235  TempSrc: Oral  PainSc: 0-No pain      Patients Stated Pain Goal: 6 ([id number])  Complications: No apparent anesthesia complications

## 2019-10-30 NOTE — H&P (Signed)
@LOGO @   Primary Care Physician:  Celene Squibb, MD Primary Gastroenterologist:  Dr. Gala Romney  Pre-Procedure History & Physical: HPI:  Greg Kemp is a 59 y.o. male is here for a screening colonoscopy.  High rescreening exam.  Daughter with colon cancer.  Patient tells me today patient's daughter is adopted.  Past Medical History:  Diagnosis Date  . Acid reflux   . Anxiety   . Chronic kidney disease    Stage III  . Depression   . Depression   . GERD (gastroesophageal reflux disease)   . Hyperlipidemia   . Morbid obesity ([place])   . Sleep apnea   . Thyroid disease     Past Surgical History:  Procedure Laterality Date  . COLONOSCOPY WITH ESOPHAGOGASTRODUODENOSCOPY (EGD)  2010   Dr. Gala Romney: ulcerative/erosive reflux esophagitis, noncritical schatki's ring, multiple bulbar erosions with normal sb biopsies, colonic diverticulosis, normal TI  . TONSILLECTOMY    . Upper and Lower GI      Prior to Admission medications   Medication Sig Start Date End Date Taking? Authorizing Provider  ALPRAZolam Duanne Moron) 1 MG tablet Take 1 tablet (1 mg total) by mouth 4 (four) times daily. 08/21/19 08/20/20 Yes Cloria Spring, MD  benztropine (COGENTIN) 0.5 MG tablet Take 1 tablet (0.5 mg total) by mouth 2 (two) times daily. 08/21/19  Yes Cloria Spring, MD  DULoxetine (CYMBALTA) 60 MG capsule Take 1 capsule (60 mg total) by mouth daily. 08/21/19 08/20/20 Yes Cloria Spring, MD  gabapentin (NEURONTIN) 300 MG capsule Take 300 mg by mouth 3 (three) times daily as needed (pain).   Yes [provider]  levothyroxine (SYNTHROID) 175 MCG tablet Take 175 mcg by mouth daily before breakfast.  07/23/19  Yes [provider]  lurasidone (LATUDA) 40 MG TABS tablet Take 40 mg by mouth daily with breakfast.   Yes [provider]  Multiple Vitamin (MULTIVITAMIN WITH MINERALS) TABS tablet Take 1 tablet by mouth daily.   Yes [provider]  pantoprazole (PROTONIX) 40 MG tablet Take  40 mg by mouth daily.   Yes [provider]  potassium chloride (KLOR-CON) 10 MEQ tablet Take 10 mEq by mouth every other day. 08/27/19  Yes [provider]  traZODone (DESYREL) 100 MG tablet Take 1 tablet (100 mg total) by mouth at bedtime. 08/21/19  Yes Cloria Spring, MD  acidophilus (RISAQUAD) CAPS capsule Take 1 capsule by mouth daily.    [provider]  linaclotide Rolan Lipa) 145 MCG CAPS capsule Take 1 capsule (145 mcg total) by mouth daily before breakfast. Patient not taking: Reported on 10/23/2019 08/11/19   Mahala Menghini, PA-C  polyethylene glycol-electrolytes (NULYTELY/GOLYTELY) 420 g solution As directed 08/11/19   Tadan Shill, Cristopher Estimable, MD    Allergies as of 08/11/2019  . (No Known Allergies)    Family History  Problem Relation Age of Onset  . Alcohol abuse Father   . Colon cancer Father   . COPD Father   . Depression Daughter        acute  . Sexual abuse Daughter   . Lung disease Mother   . COPD Mother   . Thyroid disease Mother   . Alcohol abuse Paternal Uncle   . Colon cancer Daughter 70  . ADD / ADHD Neg Hx   . Anxiety disorder Neg Hx   . OCD Neg Hx   . Drug abuse Neg Hx   . Bipolar disorder Neg Hx   . Dementia Neg Hx   .  Paranoid behavior Neg Hx   . Schizophrenia Neg Hx   . Seizures Neg Hx   . Physical abuse Neg Hx     Social History   Socioeconomic History  . Marital status: Married    Spouse name: Not on file  . Number of children: Not on file  . Years of education: Not on file  . Highest education level: Not on file  Occupational History  . Not on file  Tobacco Use  . Smoking status: Never Smoker  . Smokeless tobacco: Never Used  Substance and Sexual Activity  . Alcohol use: Yes    Comment: occ  . Drug use: No  . Sexual activity: Yes    Birth control/protection: Surgical  Other Topics Concern  . Not on file  Social History Narrative  . Not on file   Social Determinants of Health   Financial Resource Strain:    . Difficulty of Paying Living Expenses: Not on file  Food Insecurity:   . Worried About Charity fundraiser in the Last Year: Not on file  . Ran Out of Food in the Last Year: Not on file  Transportation Needs:   . Lack of Transportation (Medical): Not on file  . Lack of Transportation (Non-Medical): Not on file  Physical Activity:   . Days of Exercise per Week: Not on file  . Minutes of Exercise per Session: Not on file  Stress:   . Feeling of Stress : Not on file  Social Connections:   . Frequency of Communication with Friends and Family: Not on file  . Frequency of Social Gatherings with Friends and Family: Not on file  . Attends Religious Services: Not on file  . Active Member of Clubs or Organizations: Not on file  . Attends Archivist Meetings: Not on file  . Marital Status: Not on file  Intimate Partner Violence:   . Fear of Current or Ex-Partner: Not on file  . Emotionally Abused: Not on file  . Physically Abused: Not on file  . Sexually Abused: Not on file    Review of Systems: See HPI, otherwise negative ROS  Physical Exam: BP (!) 157/87   Pulse 76   Temp 97.7 F (36.5 C) (Oral)   Resp 20   SpO2 95%  General:   Alert,  Well-developed, well-nourished, pleasant and cooperative in NAD Lungs:  Clear throughout to auscultation.   No wheezes, crackles, or rhonchi. No acute distress. Heart:  Regular rate and rhythm; no murmurs, clicks, rubs,  or gallops. Abdomen:  Soft, nontender and nondistended. No masses, hepatosplenomegaly or hernias noted. Normal bowel sounds, without guarding, and without rebound.    Impression/Plan: Greg Kemp is now here to undergo a screening colonoscopy.  Average rescreening examination.  Risks, benefits, limitations, imponderables and alternatives regarding colonoscopy have been reviewed with the patient. Questions have been answered. All parties agreeable.     Notice:  This dictation was prepared with Dragon dictation along  with smaller phrase technology. Any transcriptional errors that result from this process are unintentional and may not be corrected upon review.

## 2019-10-30 NOTE — Anesthesia Postprocedure Evaluation (Signed)
Anesthesia Post Note  Patient: Greg Kemp  Procedure(s) Performed: COLONOSCOPY WITH PROPOFOL (N/A ) POLYPECTOMY  Patient location during evaluation: PACU Anesthesia Type: General Level of consciousness: awake and alert and oriented Pain management: pain level controlled Vital Signs Assessment: post-procedure vital signs reviewed and stable Respiratory status: spontaneous breathing Cardiovascular status: blood pressure returned to baseline and stable Postop Assessment: no apparent nausea or vomiting and adequate PO intake Anesthetic complications: no     Last Vitals:  Vitals:   10/30/19 1235 10/30/19 1353  BP: (!) 157/87 125/68  Pulse: 76 68  Resp: 20 15  Temp: 36.5 C (!) 36.1 C  SpO2: 95% 96%    Last Pain:  Vitals:   10/30/19 1235  TempSrc: Oral  PainSc: 0-No pain                 Leaann Nevils

## 2019-10-30 NOTE — Anesthesia Preprocedure Evaluation (Signed)
Anesthesia Evaluation  Patient identified by MRN, date of birth, ID band Patient awake    Reviewed: Allergy & Precautions, NPO status , Patient's Chart, lab work & pertinent test results  Airway Mallampati: III  TM Distance: >3 FB Neck ROM: Full    Dental  (+) Missing, Dental Advisory Given, Poor Dentition   Pulmonary sleep apnea ,    Pulmonary exam normal breath sounds clear to auscultation       Cardiovascular Exercise Tolerance: Good Normal cardiovascular exam Rhythm:Regular Rate:Normal     Neuro/Psych PSYCHIATRIC DISORDERS Anxiety Depression Bipolar Disorder negative neurological ROS     GI/Hepatic GERD  Medicated and Controlled,  Endo/Other  Hypothyroidism Morbid obesity  Renal/GU Renal InsufficiencyRenal disease  negative genitourinary   Musculoskeletal negative musculoskeletal ROS (+)   Abdominal (+) + obese,   Peds  Hematology   Anesthesia Other Findings   Reproductive/Obstetrics negative OB ROS                             Anesthesia Physical Anesthesia Plan  ASA: III  Anesthesia Plan: General   Post-op Pain Management:    Induction: Intravenous  PONV Risk Score and Plan: TIVA  Airway Management Planned: Simple Face Mask, Nasal Cannula and Natural Airway  Additional Equipment:   Intra-op Plan:   Post-operative Plan:   Informed Consent: I have reviewed the patients History and Physical, chart, labs and discussed the procedure including the risks, benefits and alternatives for the proposed anesthesia with the patient or authorized representative who has indicated his/her understanding and acceptance.     Dental advisory given  Plan Discussed with: CRNA  Anesthesia Plan Comments:         Anesthesia Quick Evaluation

## 2019-10-30 NOTE — Discharge Instructions (Signed)
Colonoscopy Discharge Instructions  Read the instructions outlined below and refer to this sheet in the next few weeks. These discharge instructions provide you with general information on caring for yourself after you leave the hospital. Your doctor may also give you specific instructions. While your treatment has been planned according to the most current medical practices available, unavoidable complications occasionally occur. If you have any problems or questions after discharge, call Dr. Gala Romney at [phone]. ACTIVITY  You may resume your regular activity, but move at a slower pace for the next 24 hours.   Take frequent rest periods for the next 24 hours.   Walking will help get rid of the air and reduce the bloated feeling in your belly (abdomen).   No driving for 24 hours (because of the medicine (anesthesia) used during the test).    Do not sign any important legal documents or operate any machinery for 24 hours (because of the anesthesia used during the test).  NUTRITION  Drink plenty of fluids.   You may resume your normal diet as instructed by your doctor.   Begin with a light meal and progress to your normal diet. Heavy or fried foods are harder to digest and may make you feel sick to your stomach (nauseated).   Avoid alcoholic beverages for 24 hours or as instructed.  MEDICATIONS  You may resume your normal medications unless your doctor tells you otherwise.  WHAT YOU CAN EXPECT TODAY  Some feelings of bloating in the abdomen.   Passage of more gas than usual.   Spotting of blood in your stool or on the toilet paper.  IF YOU HAD POLYPS REMOVED DURING THE COLONOSCOPY:  No aspirin products for 7 days or as instructed.   No alcohol for 7 days or as instructed.   Eat a soft diet for the next 24 hours.  FINDING OUT THE RESULTS OF YOUR TEST Not all test results are available during your visit. If your test results are not back during the visit, make an appointment  with your caregiver to find out the results. Do not assume everything is normal if you have not heard from your caregiver or the medical facility. It is important for you to follow up on all of your test results.  SEEK IMMEDIATE MEDICAL ATTENTION IF:  You have more than a spotting of blood in your stool.   Your belly is swollen (abdominal distention).   You are nauseated or vomiting.   You have a temperature over 101.   You have abdominal pain or discomfort that is severe or gets worse throughout the day.   Colon Polyps  Polyps are tissue growths inside the body. Polyps can grow in many places, including the large intestine (colon). A polyp may be a round bump or a mushroom-shaped growth. You could have one polyp or several. Most colon polyps are noncancerous (benign). However, some colon polyps can become cancerous over time. Finding and removing the polyps early can help prevent this. What are the causes? The exact cause of colon polyps is not known. What increases the risk? You are more likely to develop this condition if you: Have a family history of colon cancer or colon polyps. Are older than 75 or older than 45 if you are African American. Have inflammatory bowel disease, such as ulcerative colitis or Crohn's disease. Have certain hereditary conditions, such as: Familial adenomatous polyposis. Lynch syndrome. Turcot syndrome. Peutz-Jeghers syndrome. Are overweight. Smoke cigarettes. Do not get enough exercise. Drink too  much alcohol. Eat a diet that is high in fat and red meat and low in fiber. Had childhood cancer that was treated with abdominal radiation. What are the signs or symptoms? Most polyps do not cause symptoms. If you have symptoms, they may include: Blood coming from your rectum when having a bowel movement. Blood in your stool. The stool may look dark red or black. Abdominal pain. A change in bowel habits, such as constipation or diarrhea. How is this  diagnosed? This condition is diagnosed with a colonoscopy. This is a procedure in which a lighted, flexible scope is inserted into the anus and then passed into the colon to examine the area. Polyps are sometimes found when a colonoscopy is done as part of routine cancer screening tests. How is this treated? Treatment for this condition involves removing any polyps that are found. Most polyps can be removed during a colonoscopy. Those polyps will then be tested for cancer. Additional treatment may be needed depending on the results of testing. Follow these instructions at home: Lifestyle Maintain a healthy weight, or lose weight if recommended by your health care provider. Exercise every day or as told by your health care provider. Do not use any products that contain nicotine or tobacco, such as cigarettes and e-cigarettes. If you need help quitting, ask your health care provider. If you drink alcohol, limit how much you have: 0-1 drink a day for women. 0-2 drinks a day for men. Be aware of how much alcohol is in your drink. In the [place] one drink equals one 12 oz bottle of beer (355 mL), one 5 oz glass of wine (148 mL), or one 1 oz shot of hard liquor (44 mL). Eating and drinking  Eat foods that are high in fiber, such as fruits, vegetables, and whole grains. Eat foods that are high in calcium and vitamin D, such as milk, cheese, yogurt, eggs, liver, fish, and broccoli. Limit foods that are high in fat, such as fried foods and desserts. Limit the amount of red meat and processed meat you eat, such as hot dogs, sausage, bacon, and lunch meats. General instructions Keep all follow-up visits as told by your health care provider. This is important. This includes having regularly scheduled colonoscopies. Talk to your health care provider about when you need a colonoscopy. Contact a health care provider if: You have new or worsening bleeding during a bowel movement. You have new or increased  blood in your stool. You have a change in bowel habits. You lose weight for no known reason. Summary Polyps are tissue growths inside the body. Polyps can grow in many places, including the colon. Most colon polyps are noncancerous (benign), but some can become cancerous over time. This condition is diagnosed with a colonoscopy. Treatment for this condition involves removing any polyps that are found. Most polyps can be removed during a colonoscopy. This information is not intended to replace advice given to you by your health care provider. Make sure you discuss any questions you have with your health care provider. Document Revised: 01/10/2018 Document Reviewed: 01/10/2018 Elsevier Patient Education  [place].    Multiple colon polyps removed today.  Colon polyp information provided  Further recommendations to follow pending review of pathology report  At patient request, I called Haneef Iten, wife, [phone] and reviewed results

## 2019-11-03 LAB — SURGICAL PATHOLOGY

## 2019-11-04 ENCOUNTER — Encounter: Payer: Self-pay | Admitting: Internal Medicine

## 2019-11-10 ENCOUNTER — Ambulatory Visit (INDEPENDENT_AMBULATORY_CARE_PROVIDER_SITE_OTHER): Payer: Medicare HMO | Admitting: Psychiatry

## 2019-11-10 ENCOUNTER — Encounter (HOSPITAL_COMMUNITY): Payer: Self-pay | Admitting: Psychiatry

## 2019-11-10 ENCOUNTER — Other Ambulatory Visit: Payer: Self-pay

## 2019-11-10 DIAGNOSIS — F419 Anxiety disorder, unspecified: Secondary | ICD-10-CM | POA: Diagnosis not present

## 2019-11-10 DIAGNOSIS — F332 Major depressive disorder, recurrent severe without psychotic features: Secondary | ICD-10-CM | POA: Diagnosis not present

## 2019-11-10 MED ORDER — DULOXETINE HCL 60 MG PO CPEP: 60.0000 mg | ORAL_CAPSULE | Freq: Every day | ORAL | 2 refills | Status: DC

## 2019-11-10 MED ORDER — ALPRAZOLAM 1 MG PO TABS: 1.0000 mg | ORAL_TABLET | Freq: Four times a day (QID) | ORAL | 2 refills | Status: DC

## 2019-11-10 MED ORDER — BENZTROPINE MESYLATE 0.5 MG PO TABS: 0.5000 mg | ORAL_TABLET | Freq: Two times a day (BID) | ORAL | 2 refills | Status: DC

## 2019-11-10 NOTE — Progress Notes (Signed)
Virtual Visit via Telephone Note  I connected with Greg Kemp on 11/10/19 at  1:00 PM EST by telephone and verified that I am speaking with the correct person using two identifiers.   I discussed the limitations, risks, security and privacy concerns of performing an evaluation and management service by telephone and the availability of in person appointments. I also discussed with the patient that there may be a patient responsible charge related to this service. The patient expressed understanding and agreed to proceed.   I discussed the assessment and treatment plan with the patient. The patient was provided an opportunity to ask questions and all were answered. The patient agreed with the plan and demonstrated an understanding of the instructions.   The patient was advised to call back or seek an in-person evaluation if the symptoms worsen or if the condition fails to improve as anticipated.  I provided 15 minutes of non-face-to-face time during this encounter.   [name], MD  [facility] MD/PA/NP OP Progress Note  11/10/2019 1:29 PM Greg Kemp  MRN:  [id number]  Chief Complaint:  Chief Complaint    Depression; Anxiety; Follow-up     HPI: This patient is a 59 year old male who lives with his wife in [place].  He is on disability.  The patient returns for follow-up regarding his posttraumatic stress disorder and anxiety.  He states that he is doing okay right now but a couple of weeks ago was feeling really low.  He thinks the coronavirus pandemic is weighing on him and he really misses spending a lot of time with his children and grandchildren.  One of his daughters is married to a man who does not want anyone to come into their home because of the pandemic.  This makes him feel sad and helpless.  On the positive side he has joined a gym and is doing a lot more walking on a treadmill and elliptical.  He is sleeping well.  He denies any auditory visual hallucinations or thoughts of  self-harm.  Getting back on Latuda seems to have helped and the Xanax continues to help his anxiety. Visit Diagnosis:    ICD-10-CM   1. Major depressive disorder, recurrent, severe without psychotic features ([place])  F33.2   2. Anxiety  F41.9     Past Psychiatric History: Several past psychiatric hospitalizations but none since 2012  Past Medical History:  Past Medical History:  Diagnosis Date  . Acid reflux   . Anxiety   . Chronic kidney disease    Stage III  . Depression   . Depression   . GERD (gastroesophageal reflux disease)   . Hyperlipidemia   . Morbid obesity ([place])   . Sleep apnea   . Thyroid disease     Past Surgical History:  Procedure Laterality Date  . COLONOSCOPY WITH ESOPHAGOGASTRODUODENOSCOPY (EGD)  2010   Dr. Gala Romney: ulcerative/erosive reflux esophagitis, noncritical schatki's ring, multiple bulbar erosions with normal sb biopsies, colonic diverticulosis, normal TI  . COLONOSCOPY WITH PROPOFOL N/A 10/30/2019   Procedure: COLONOSCOPY WITH PROPOFOL;  Surgeon: Daneil Dolin, MD;  [facility];  Service: Endoscopy;  Laterality: N/A;  1:30pm  . POLYPECTOMY  10/30/2019   Procedure: POLYPECTOMY;  Surgeon: Daneil Dolin, MD;  [facility];  Service: Endoscopy;;  . TONSILLECTOMY    . Upper and Lower GI      Family Psychiatric History: See below  Family History:  Family History  Problem Relation Age of Onset  .  Alcohol abuse Father   . Colon cancer Father   . COPD Father   . Depression Daughter        acute  . Sexual abuse Daughter   . Lung disease Mother   . COPD Mother   . Thyroid disease Mother   . Alcohol abuse Paternal Uncle   . Colon cancer Daughter 44  . ADD / ADHD Neg Hx   . Anxiety disorder Neg Hx   . OCD Neg Hx   . Drug abuse Neg Hx   . Bipolar disorder Neg Hx   . Dementia Neg Hx   . Paranoid behavior Neg Hx   . Schizophrenia Neg Hx   . Seizures Neg Hx   . Physical abuse Neg Hx     Social History:  Social History    Socioeconomic History  . Marital status: Married    Spouse name: Not on file  . Number of children: Not on file  . Years of education: Not on file  . Highest education level: Not on file  Occupational History  . Not on file  Tobacco Use  . Smoking status: Never Smoker  . Smokeless tobacco: Never Used  Substance and Sexual Activity  . Alcohol use: Yes    Comment: occ  . Drug use: No  . Sexual activity: Yes    Birth control/protection: Surgical  Other Topics Concern  . Not on file  Social History Narrative  . Not on file   Social Determinants of Health   Financial Resource Strain:   . Difficulty of Paying Living Expenses: Not on file  Food Insecurity:   . Worried About Charity fundraiser in the Last Year: Not on file  . Ran Out of Food in the Last Year: Not on file  Transportation Needs:   . Lack of Transportation (Medical): Not on file  . Lack of Transportation (Non-Medical): Not on file  Physical Activity:   . Days of Exercise per Week: Not on file  . Minutes of Exercise per Session: Not on file  Stress:   . Feeling of Stress : Not on file  Social Connections:   . Frequency of Communication with Friends and Family: Not on file  . Frequency of Social Gatherings with Friends and Family: Not on file  . Attends Religious Services: Not on file  . Active Member of Clubs or Organizations: Not on file  . Attends Archivist Meetings: Not on file  . Marital Status: Not on file    Allergies: No Known Allergies  Metabolic Disorder Labs: No results found for: HGBA1C, MPG No results found for: PROLACTIN Lab Results  Component Value Date   CHOL  06/16/2010    166        ATP III CLASSIFICATION:  <200     mg/dL   Desirable  200-239  mg/dL   Borderline High  >=240    mg/dL   High          TRIG 166 (H) 06/16/2010   HDL 29 (L) 06/16/2010   CHOLHDL 5.7 06/16/2010   VLDL 33 06/16/2010   LDLCALC (H) 06/16/2010    104        Total Cholesterol/HDL:CHD  Risk Coronary Heart Disease Risk Table                     Men   Women  1/2 Average Risk   3.4   3.3  Average Risk  5.0   4.4  2 X Average Risk   9.6   7.1  3 X Average Risk  23.4   11.0        Use the calculated Patient Ratio above and the CHD Risk Table to determine the patient's CHD Risk.        ATP III CLASSIFICATION (LDL):  <100     mg/dL   Optimal  100-129  mg/dL   Near or Above                    Optimal  130-159  mg/dL   Borderline  160-189  mg/dL   High  >190     mg/dL   Very High   Lab Results  Component Value Date   TSH 11.645 (H) 12/14/2010   TSH 0.264 (L) 06/16/2010    Therapeutic Level Labs: No results found for: LITHIUM No results found for: VALPROATE No components found for:  CBMZ  Current Medications: Current Outpatient Medications  Medication Sig Dispense Refill  . acidophilus (RISAQUAD) CAPS capsule Take 1 capsule by mouth daily.    Marland Kitchen ALPRAZolam (XANAX) 1 MG tablet Take 1 tablet (1 mg total) by mouth 4 (four) times daily. 120 tablet 2  . benztropine (COGENTIN) 0.5 MG tablet Take 1 tablet (0.5 mg total) by mouth 2 (two) times daily. 60 tablet 2  . DULoxetine (CYMBALTA) 60 MG capsule Take 1 capsule (60 mg total) by mouth daily. 90 capsule 2  . gabapentin (NEURONTIN) 300 MG capsule Take 300 mg by mouth 3 (three) times daily as needed (pain).    Marland Kitchen levothyroxine (SYNTHROID) 175 MCG tablet Take 175 mcg by mouth daily before breakfast.     . lurasidone (LATUDA) 40 MG TABS tablet Take 40 mg by mouth daily with breakfast.    . Multiple Vitamin (MULTIVITAMIN WITH MINERALS) TABS tablet Take 1 tablet by mouth daily.    . pantoprazole (PROTONIX) 40 MG tablet Take 40 mg by mouth daily.    . polyethylene glycol-electrolytes (NULYTELY/GOLYTELY) 420 g solution As directed 4000 mL 0  . potassium chloride (KLOR-CON) 10 MEQ tablet Take 10 mEq by mouth every other day.     No current facility-administered medications for this visit.     Musculoskeletal: Strength  & Muscle Tone: within normal limits Gait & Station: normal Patient leans: N/A  Psychiatric Specialty Exam: Review of Systems  Psychiatric/Behavioral: Positive for dysphoric mood.  All other systems reviewed and are negative.   There were no vitals taken for this visit.There is no height or weight on file to calculate BMI.  General Appearance: NA  Eye Contact:  NA  Speech:  Clear and Coherent  Volume:  Normal  Mood:  Dysphoric  Affect:  NA  Thought Process:  Goal Directed  Orientation:  Full (Time, Place, and Person)  Thought Content: Rumination   Suicidal Thoughts:  No  Homicidal Thoughts:  No  Memory:  Immediate;   Good Recent;   Good Remote;   Fair  Judgement:  Good  Insight:  Fair  Psychomotor Activity:  Decreased  Concentration:  Concentration: Good and Attention Span: Good  Recall:  Good  Fund of Knowledge: Good  Language: Good  Akathisia:  No  Handed:  Right  AIMS (if indicated): not done  Assets:  Communication Skills Desire for Improvement Resilience Social Support Talents/Skills  ADL's:  Intact  Cognition: WNL  Sleep:  Good   Screenings:   Assessment and Plan: This patient is a 59 year old male  with a history of posttraumatic stress disorder depression and anxiety.  He seems to have gone through some ups and downs due to the pandemic but right now is on the upswing.  Therefore I suggest we continue Latuda 60 mg daily for mood stabilization, Cymbalta 60 mg daily for depression, trazodone 100 mg at bedtime for sleep and Xanax 1 mg up to 4 times daily for anxiety and Cogentin 0.5 mg twice daily for tremor.  He will return to see me in 3 months   [name], MD 11/10/2019, 1:29 PM

## 2019-11-21 DIAGNOSIS — M25569 Pain in unspecified knee: Secondary | ICD-10-CM | POA: Diagnosis not present

## 2019-11-21 DIAGNOSIS — Z0000 Encounter for general adult medical examination without abnormal findings: Secondary | ICD-10-CM | POA: Diagnosis not present

## 2019-11-21 DIAGNOSIS — E039 Hypothyroidism, unspecified: Secondary | ICD-10-CM | POA: Diagnosis not present

## 2019-11-21 DIAGNOSIS — E876 Hypokalemia: Secondary | ICD-10-CM | POA: Diagnosis not present

## 2019-11-21 DIAGNOSIS — K219 Gastro-esophageal reflux disease without esophagitis: Secondary | ICD-10-CM | POA: Diagnosis not present

## 2019-11-21 DIAGNOSIS — Z0001 Encounter for general adult medical examination with abnormal findings: Secondary | ICD-10-CM | POA: Diagnosis not present

## 2019-11-21 DIAGNOSIS — F411 Generalized anxiety disorder: Secondary | ICD-10-CM | POA: Diagnosis not present

## 2019-11-21 DIAGNOSIS — F4310 Post-traumatic stress disorder, unspecified: Secondary | ICD-10-CM | POA: Diagnosis not present

## 2019-11-21 DIAGNOSIS — R7301 Impaired fasting glucose: Secondary | ICD-10-CM | POA: Diagnosis not present

## 2019-11-21 DIAGNOSIS — G3184 Mild cognitive impairment, so stated: Secondary | ICD-10-CM | POA: Diagnosis not present

## 2019-11-26 DIAGNOSIS — N1831 Chronic kidney disease, stage 3a: Secondary | ICD-10-CM | POA: Diagnosis not present

## 2019-11-26 DIAGNOSIS — E039 Hypothyroidism, unspecified: Secondary | ICD-10-CM | POA: Diagnosis not present

## 2019-11-26 DIAGNOSIS — R7301 Impaired fasting glucose: Secondary | ICD-10-CM | POA: Diagnosis not present

## 2019-11-26 DIAGNOSIS — E782 Mixed hyperlipidemia: Secondary | ICD-10-CM | POA: Diagnosis not present

## 2019-11-26 DIAGNOSIS — K219 Gastro-esophageal reflux disease without esophagitis: Secondary | ICD-10-CM | POA: Diagnosis not present

## 2019-11-26 DIAGNOSIS — F3181 Bipolar II disorder: Secondary | ICD-10-CM | POA: Diagnosis not present

## 2019-11-26 DIAGNOSIS — G4700 Insomnia, unspecified: Secondary | ICD-10-CM | POA: Diagnosis not present

## 2019-11-26 DIAGNOSIS — M545 Low back pain: Secondary | ICD-10-CM | POA: Diagnosis not present

## 2019-11-26 DIAGNOSIS — F411 Generalized anxiety disorder: Secondary | ICD-10-CM | POA: Diagnosis not present

## 2019-12-24 ENCOUNTER — Telehealth (HOSPITAL_COMMUNITY): Payer: Self-pay | Admitting: *Deleted

## 2019-12-24 NOTE — Telephone Encounter (Signed)
CALL TO CHECK ON APPT TIME & DATE 02/16/20 1 PM

## 2020-01-14 ENCOUNTER — Other Ambulatory Visit (HOSPITAL_COMMUNITY): Payer: Self-pay | Admitting: Psychiatry

## 2020-01-16 ENCOUNTER — Other Ambulatory Visit (HOSPITAL_COMMUNITY): Payer: Self-pay | Admitting: Psychiatry

## 2020-02-04 DIAGNOSIS — F3181 Bipolar II disorder: Secondary | ICD-10-CM | POA: Diagnosis not present

## 2020-02-04 DIAGNOSIS — F411 Generalized anxiety disorder: Secondary | ICD-10-CM | POA: Diagnosis not present

## 2020-02-04 DIAGNOSIS — E785 Hyperlipidemia, unspecified: Secondary | ICD-10-CM | POA: Diagnosis not present

## 2020-02-04 DIAGNOSIS — N183 Chronic kidney disease, stage 3 unspecified: Secondary | ICD-10-CM | POA: Diagnosis not present

## 2020-02-04 DIAGNOSIS — E039 Hypothyroidism, unspecified: Secondary | ICD-10-CM | POA: Diagnosis not present

## 2020-02-16 ENCOUNTER — Telehealth (INDEPENDENT_AMBULATORY_CARE_PROVIDER_SITE_OTHER): Payer: Medicare HMO | Admitting: Psychiatry

## 2020-02-16 ENCOUNTER — Encounter (HOSPITAL_COMMUNITY): Payer: Self-pay | Admitting: Psychiatry

## 2020-02-16 ENCOUNTER — Other Ambulatory Visit: Payer: Self-pay

## 2020-02-16 DIAGNOSIS — F332 Major depressive disorder, recurrent severe without psychotic features: Secondary | ICD-10-CM | POA: Diagnosis not present

## 2020-02-16 DIAGNOSIS — F5105 Insomnia due to other mental disorder: Secondary | ICD-10-CM

## 2020-02-16 DIAGNOSIS — F419 Anxiety disorder, unspecified: Secondary | ICD-10-CM

## 2020-02-16 MED ORDER — ALPRAZOLAM 1 MG PO TABS: 1.0000 mg | ORAL_TABLET | Freq: Four times a day (QID) | ORAL | 2 refills | Status: DC

## 2020-02-16 MED ORDER — TRAZODONE HCL 100 MG PO TABS: ORAL_TABLET | ORAL | 2 refills | Status: DC

## 2020-02-16 MED ORDER — DULOXETINE HCL 60 MG PO CPEP: 60.0000 mg | ORAL_CAPSULE | Freq: Every day | ORAL | 2 refills | Status: DC

## 2020-02-16 MED ORDER — BENZTROPINE MESYLATE 0.5 MG PO TABS: 0.5000 mg | ORAL_TABLET | Freq: Two times a day (BID) | ORAL | 2 refills | Status: DC

## 2020-02-16 NOTE — Progress Notes (Signed)
Virtual Visit via Video Note  I connected with Greg Kemp on 02/16/20 at  1:00 PM EDT by a video enabled telemedicine application and verified that I am speaking with the correct person using two identifiers.   I discussed the limitations of evaluation and management by telemedicine and the availability of in person appointments. The patient expressed understanding and agreed to proceed.    I discussed the assessment and treatment plan with the patient. The patient was provided an opportunity to ask questions and all were answered. The patient agreed with the plan and demonstrated an understanding of the instructions.   The patient was advised to call back or seek an in-person evaluation if the symptoms worsen or if the condition fails to improve as anticipated.  I provided 15 minutes of non-face-to-face time during this encounter.   [name], MD  [facility] MD/PA/NP OP Progress Note  02/16/2020 1:22 PM Greg Kemp  MRN:  [id number]  Chief Complaint:  Chief Complaint    Depression; Anxiety; Follow-up     HPI: This patient is a 59 year old male who lives with his wife in [place].  He is on disability.  The patient returns for follow-up of posttraumatic stress disorder and anxiety.  Overall he is doing fairly well.  He still concerned about memory loss.  The other day he was driving to the gym and missed the exit and went into the next town.  He states that this happens fairly frequently.  He states that he often starts thinking about something and gets preoccupied.  He is talked to his family doctor about it but he does not seem particularly worried about it.  He states that he is sleeping well his energy is good his medical problems are under good control and his thyroid labs have been normal.  He has been going to a gym and has lost about 30 pounds and feels much better.  He denies any auditory visual loose Nations or thoughts of self-harm.  His wife thinks he is doing much better  in terms of mood. Visit Diagnosis:    ICD-10-CM   1. Major depressive disorder, recurrent, severe without psychotic features ([place])  F33.2   2. Anxiety  F41.9   3. Insomnia due to mental disorder  F51.05     Past Psychiatric History: Several past hospitalizations but none since 2012  Past Medical History:  Past Medical History:  Diagnosis Date  . Acid reflux   . Anxiety   . Chronic kidney disease    Stage III  . Depression   . Depression   . GERD (gastroesophageal reflux disease)   . Hyperlipidemia   . Morbid obesity ([place])   . Sleep apnea   . Thyroid disease     Past Surgical History:  Procedure Laterality Date  . COLONOSCOPY WITH ESOPHAGOGASTRODUODENOSCOPY (EGD)  2010   Dr. Gala Romney: ulcerative/erosive reflux esophagitis, noncritical schatki's ring, multiple bulbar erosions with normal sb biopsies, colonic diverticulosis, normal TI  . COLONOSCOPY WITH PROPOFOL N/A 10/30/2019   Procedure: COLONOSCOPY WITH PROPOFOL;  Surgeon: Daneil Dolin, MD;  [facility];  Service: Endoscopy;  Laterality: N/A;  1:30pm  . POLYPECTOMY  10/30/2019   Procedure: POLYPECTOMY;  Surgeon: Daneil Dolin, MD;  [facility];  Service: Endoscopy;;  . TONSILLECTOMY    . Upper and Lower GI      Family Psychiatric History: see below  Family History:  Family History  Problem Relation Age of Onset  .  Alcohol abuse Father   . Colon cancer Father   . COPD Father   . Depression Daughter        acute  . Sexual abuse Daughter   . Lung disease Mother   . COPD Mother   . Thyroid disease Mother   . Alcohol abuse Paternal Uncle   . Colon cancer Daughter 52  . ADD / ADHD Neg Hx   . Anxiety disorder Neg Hx   . OCD Neg Hx   . Drug abuse Neg Hx   . Bipolar disorder Neg Hx   . Dementia Neg Hx   . Paranoid behavior Neg Hx   . Schizophrenia Neg Hx   . Seizures Neg Hx   . Physical abuse Neg Hx     Social History:  Social History   Socioeconomic History  . Marital status:  Married    Spouse name: Not on file  . Number of children: Not on file  . Years of education: Not on file  . Highest education level: Not on file  Occupational History  . Not on file  Tobacco Use  . Smoking status: Never Smoker  . Smokeless tobacco: Never Used  Substance and Sexual Activity  . Alcohol use: Yes    Comment: occ  . Drug use: No  . Sexual activity: Yes    Birth control/protection: Surgical  Other Topics Concern  . Not on file  Social History Narrative  . Not on file   Social Determinants of Health   Financial Resource Strain:   . Difficulty of Paying Living Expenses:   Food Insecurity:   . Worried About Charity fundraiser in the Last Year:   . Arboriculturist in the Last Year:   Transportation Needs:   . Film/video editor (Medical):   Marland Kitchen Lack of Transportation (Non-Medical):   Physical Activity:   . Days of Exercise per Week:   . Minutes of Exercise per Session:   Stress:   . Feeling of Stress :   Social Connections:   . Frequency of Communication with Friends and Family:   . Frequency of Social Gatherings with Friends and Family:   . Attends Religious Services:   . Active Member of Clubs or Organizations:   . Attends Archivist Meetings:   Marland Kitchen Marital Status:     Allergies: No Known Allergies  Metabolic Disorder Labs: No results found for: HGBA1C, MPG No results found for: PROLACTIN Lab Results  Component Value Date   CHOL  06/16/2010    166        ATP III CLASSIFICATION:  <200     mg/dL   Desirable  200-239  mg/dL   Borderline High  >=240    mg/dL   High          TRIG 166 (H) 06/16/2010   HDL 29 (L) 06/16/2010   CHOLHDL 5.7 06/16/2010   VLDL 33 06/16/2010   LDLCALC (H) 06/16/2010    104        Total Cholesterol/HDL:CHD Risk Coronary Heart Disease Risk Table                     Men   Women  1/2 Average Risk   3.4   3.3  Average Risk       5.0   4.4  2 X Average Risk   9.6   7.1  3 X Average Risk  23.4   11.0  Use the calculated Patient Ratio above and the CHD Risk Table to determine the patient's CHD Risk.        ATP III CLASSIFICATION (LDL):  <100     mg/dL   Optimal  100-129  mg/dL   Near or Above                    Optimal  130-159  mg/dL   Borderline  160-189  mg/dL   High  >190     mg/dL   Very High   Lab Results  Component Value Date   TSH 11.645 (H) 12/14/2010   TSH 0.264 (L) 06/16/2010    Therapeutic Level Labs: No results found for: LITHIUM No results found for: VALPROATE No components found for:  CBMZ  Current Medications: Current Outpatient Medications  Medication Sig Dispense Refill  . acidophilus (RISAQUAD) CAPS capsule Take 1 capsule by mouth daily.    Marland Kitchen ALPRAZolam (XANAX) 1 MG tablet Take 1 tablet (1 mg total) by mouth 4 (four) times daily. 120 tablet 2  . benztropine (COGENTIN) 0.5 MG tablet Take 1 tablet (0.5 mg total) by mouth 2 (two) times daily. 60 tablet 2  . DULoxetine (CYMBALTA) 60 MG capsule Take 1 capsule (60 mg total) by mouth daily. 90 capsule 2  . gabapentin (NEURONTIN) 300 MG capsule Take 300 mg by mouth 3 (three) times daily as needed (pain).    Marland Kitchen levothyroxine (SYNTHROID) 175 MCG tablet Take 175 mcg by mouth daily before breakfast.     . lurasidone (LATUDA) 40 MG TABS tablet Take 40 mg by mouth daily with breakfast.    . Multiple Vitamin (MULTIVITAMIN WITH MINERALS) TABS tablet Take 1 tablet by mouth daily.    . pantoprazole (PROTONIX) 40 MG tablet Take 40 mg by mouth daily.    . polyethylene glycol-electrolytes (NULYTELY/GOLYTELY) 420 g solution As directed 4000 mL 0  . potassium chloride (KLOR-CON) 10 MEQ tablet Take 10 mEq by mouth every other day.    . traZODone (DESYREL) 100 MG tablet TAKE 1 TABLET (100 MG TOTAL) BY MOUTH AT BEDTIME. 90 tablet 2   No current facility-administered medications for this visit.     Musculoskeletal: Strength & Muscle Tone: within normal limits Gait & Station: normal Patient leans: N/A  Psychiatric Specialty  Exam: Review of Systems  All other systems reviewed and are negative.   There were no vitals taken for this visit.There is no height or weight on file to calculate BMI.  General Appearance: Casual and Fairly Groomed  Eye Contact:  Good  Speech:  Clear and Coherent  Volume:  Normal  Mood:  Euthymic  Affect:  Appropriate and Congruent  Thought Process:  Goal Directed  Orientation:  Full (Time, Place, and Person)  Thought Content: Rumination   Suicidal Thoughts:  No  Homicidal Thoughts:  No  Memory:  Immediate;   Good Recent;   Fair Remote;   Fair  Judgement:  Good  Insight:  Fair  Psychomotor Activity:  Normal  Concentration:  Concentration: Good and Attention Span: Good  Recall:  [employer] of Knowledge: Fair  Language: Good  Akathisia:  No  Handed:  Right  AIMS (if indicated): not done  Assets:  Communication Skills Desire for Improvement Resilience Social Support Talents/Skills  ADL's:  Intact  Cognition: WNL  Sleep:  Good   Screenings:   Assessment and Plan: This patient is a 59 year old male with a history of posttraumatic stress disorder depression and anxiety for now he  seems to be doing fairly well.  He will continue Latuda 60 mg daily for mood stabilization, Cymbalta 60 mg daily for depression, trazodone 100 mg at bedtime for sleep, Xanax 1 mg up to 4 times daily for anxiety and Cogentin 0.5 mg twice daily for tremor.  I suggested he try to keep the Xanax to a minimum and he agrees since this has a higher impact on memory than most of his other medications.  He will return to see me in 3 months   [name], MD 02/16/2020, 1:22 PM

## 2020-02-20 DIAGNOSIS — N183 Chronic kidney disease, stage 3 unspecified: Secondary | ICD-10-CM | POA: Diagnosis not present

## 2020-02-20 DIAGNOSIS — E039 Hypothyroidism, unspecified: Secondary | ICD-10-CM | POA: Diagnosis not present

## 2020-02-20 DIAGNOSIS — F411 Generalized anxiety disorder: Secondary | ICD-10-CM | POA: Diagnosis not present

## 2020-02-20 DIAGNOSIS — F3181 Bipolar II disorder: Secondary | ICD-10-CM | POA: Diagnosis not present

## 2020-02-20 DIAGNOSIS — E785 Hyperlipidemia, unspecified: Secondary | ICD-10-CM | POA: Diagnosis not present

## 2020-03-03 ENCOUNTER — Other Ambulatory Visit: Payer: Self-pay

## 2020-03-03 ENCOUNTER — Telehealth (HOSPITAL_COMMUNITY): Payer: Medicare HMO | Admitting: Psychiatry

## 2020-03-31 ENCOUNTER — Telehealth (INDEPENDENT_AMBULATORY_CARE_PROVIDER_SITE_OTHER): Payer: Medicare HMO | Admitting: Psychiatry

## 2020-03-31 ENCOUNTER — Encounter (HOSPITAL_COMMUNITY): Payer: Self-pay | Admitting: Psychiatry

## 2020-03-31 ENCOUNTER — Other Ambulatory Visit: Payer: Self-pay

## 2020-03-31 DIAGNOSIS — F419 Anxiety disorder, unspecified: Secondary | ICD-10-CM

## 2020-03-31 DIAGNOSIS — F332 Major depressive disorder, recurrent severe without psychotic features: Secondary | ICD-10-CM | POA: Diagnosis not present

## 2020-03-31 DIAGNOSIS — F5105 Insomnia due to other mental disorder: Secondary | ICD-10-CM

## 2020-03-31 MED ORDER — ALPRAZOLAM 1 MG PO TABS: 1.0000 mg | ORAL_TABLET | Freq: Four times a day (QID) | ORAL | 2 refills | Status: DC

## 2020-03-31 MED ORDER — BENZTROPINE MESYLATE 0.5 MG PO TABS: 0.5000 mg | ORAL_TABLET | Freq: Two times a day (BID) | ORAL | 2 refills | Status: DC

## 2020-03-31 MED ORDER — TRAZODONE HCL 100 MG PO TABS: ORAL_TABLET | ORAL | 2 refills | Status: DC

## 2020-03-31 MED ORDER — DULOXETINE HCL 60 MG PO CPEP: 60.0000 mg | ORAL_CAPSULE | Freq: Every day | ORAL | 2 refills | Status: DC

## 2020-03-31 NOTE — Progress Notes (Signed)
Virtual Visit via Video Note  I connected with Greg Kemp on 03/31/20 at 10:20 AM EDT by a video enabled telemedicine application and verified that I am speaking with the correct person using two identifiers.   I discussed the limitations of evaluation and management by telemedicine and the availability of in person appointments. The patient expressed understanding and agreed to proceed.    I discussed the assessment and treatment plan with the patient. The patient was provided an opportunity to ask questions and all were answered. The patient agreed with the plan and demonstrated an understanding of the instructions.   The patient was advised to call back or seek an in-person evaluation if the symptoms worsen or if the condition fails to improve as anticipated.  I provided 15 minutes of non-face-to-face time during this encounter. Location: Provider name, patient home  [name], MD  [facility] MD/PA/NP OP Progress Note  03/31/2020 10:48 AM Greg Kemp  MRN:  [id number]  Chief Complaint:  Chief Complaint    Depression; Anxiety; Follow-up     HPI: This patient is a 59 year old white male who lives with his wife in [place].  He is on disability.  The patient returns after 6 weeks for follow-up of posttraumatic stress disorder and anxiety.  For the most part he is doing well.  Sometimes his temper gets the best of him but for the most part he can control it.  He is sleeping well.  He still concerned about his memory.  Sometimes he has to be told several times by his wife to do things.  He will get lost during conversations TV shows and books.  I suggested that we make referral for some neuropsychological testing and he agrees.  He states that his mood is generally good and he is still going to the gym 3 times a week.  His anxiety is under good control.  He denies any thoughts of self-harm or suicide.  He denies any auditory or visual hallucinations. Visit Diagnosis:    ICD-10-CM    1. Major depressive disorder, recurrent, severe without psychotic features ([place])  F33.2   2. Anxiety  F41.9   3. Insomnia due to mental disorder  F51.05     Past Psychiatric History: Several past hospitalizations but none since 2012  Past Medical History:  Past Medical History:  Diagnosis Date  . Acid reflux   . Anxiety   . Chronic kidney disease    Stage III  . Depression   . Depression   . GERD (gastroesophageal reflux disease)   . Hyperlipidemia   . Morbid obesity ([place])   . Sleep apnea   . Thyroid disease     Past Surgical History:  Procedure Laterality Date  . COLONOSCOPY WITH ESOPHAGOGASTRODUODENOSCOPY (EGD)  2010   Dr. Gala Romney: ulcerative/erosive reflux esophagitis, noncritical schatki's ring, multiple bulbar erosions with normal sb biopsies, colonic diverticulosis, normal TI  . COLONOSCOPY WITH PROPOFOL N/A 10/30/2019   Procedure: COLONOSCOPY WITH PROPOFOL;  Surgeon: Daneil Dolin, MD;  [facility];  Service: Endoscopy;  Laterality: N/A;  1:30pm  . POLYPECTOMY  10/30/2019   Procedure: POLYPECTOMY;  Surgeon: Daneil Dolin, MD;  [facility];  Service: Endoscopy;;  . TONSILLECTOMY    . Upper and Lower GI      Family Psychiatric History: See below  Family History:  Family History  Problem Relation Age of Onset  . Alcohol abuse Father   . Colon cancer Father   .  COPD Father   . Depression Daughter        acute  . Sexual abuse Daughter   . Lung disease Mother   . COPD Mother   . Thyroid disease Mother   . Alcohol abuse Paternal Uncle   . Colon cancer Daughter 104  . ADD / ADHD Neg Hx   . Anxiety disorder Neg Hx   . OCD Neg Hx   . Drug abuse Neg Hx   . Bipolar disorder Neg Hx   . Dementia Neg Hx   . Paranoid behavior Neg Hx   . Schizophrenia Neg Hx   . Seizures Neg Hx   . Physical abuse Neg Hx     Social History:  Social History   Socioeconomic History  . Marital status: Married    Spouse name: Not on file  . Number of  children: Not on file  . Years of education: Not on file  . Highest education level: Not on file  Occupational History  . Not on file  Tobacco Use  . Smoking status: Never Smoker  . Smokeless tobacco: Never Used  Substance and Sexual Activity  . Alcohol use: Yes    Comment: occ  . Drug use: No  . Sexual activity: Yes    Birth control/protection: Surgical  Other Topics Concern  . Not on file  Social History Narrative  . Not on file   Social Determinants of Health   Financial Resource Strain:   . Difficulty of Paying Living Expenses:   Food Insecurity:   . Worried About Charity fundraiser in the Last Year:   . Arboriculturist in the Last Year:   Transportation Needs:   . Film/video editor (Medical):   Marland Kitchen Lack of Transportation (Non-Medical):   Physical Activity:   . Days of Exercise per Week:   . Minutes of Exercise per Session:   Stress:   . Feeling of Stress :   Social Connections:   . Frequency of Communication with Friends and Family:   . Frequency of Social Gatherings with Friends and Family:   . Attends Religious Services:   . Active Member of Clubs or Organizations:   . Attends Archivist Meetings:   Marland Kitchen Marital Status:     Allergies: No Known Allergies  Metabolic Disorder Labs: No results found for: HGBA1C, MPG No results found for: PROLACTIN Lab Results  Component Value Date   CHOL  06/16/2010    166        ATP III CLASSIFICATION:  <200     mg/dL   Desirable  200-239  mg/dL   Borderline High  >=240    mg/dL   High          TRIG 166 (H) 06/16/2010   HDL 29 (L) 06/16/2010   CHOLHDL 5.7 06/16/2010   VLDL 33 06/16/2010   LDLCALC (H) 06/16/2010    104        Total Cholesterol/HDL:CHD Risk Coronary Heart Disease Risk Table                     Men   Women  1/2 Average Risk   3.4   3.3  Average Risk       5.0   4.4  2 X Average Risk   9.6   7.1  3 X Average Risk  23.4   11.0        Use the calculated Patient Ratio above and the CHD  Risk Table to determine the patient's CHD Risk.        ATP III CLASSIFICATION (LDL):  <100     mg/dL   Optimal  100-129  mg/dL   Near or Above                    Optimal  130-159  mg/dL   Borderline  160-189  mg/dL   High  >190     mg/dL   Very High   Lab Results  Component Value Date   TSH 11.645 (H) 12/14/2010   TSH 0.264 (L) 06/16/2010    Therapeutic Level Labs: No results found for: LITHIUM No results found for: VALPROATE No components found for:  CBMZ  Current Medications: Current Outpatient Medications  Medication Sig Dispense Refill  . acidophilus (RISAQUAD) CAPS capsule Take 1 capsule by mouth daily.    Marland Kitchen ALPRAZolam (XANAX) 1 MG tablet Take 1 tablet (1 mg total) by mouth 4 (four) times daily. 120 tablet 2  . benztropine (COGENTIN) 0.5 MG tablet Take 1 tablet (0.5 mg total) by mouth 2 (two) times daily. 60 tablet 2  . DULoxetine (CYMBALTA) 60 MG capsule Take 1 capsule (60 mg total) by mouth daily. 90 capsule 2  . gabapentin (NEURONTIN) 300 MG capsule Take 300 mg by mouth 3 (three) times daily as needed (pain).    Marland Kitchen levothyroxine (SYNTHROID) 175 MCG tablet Take 175 mcg by mouth daily before breakfast.     . lurasidone (LATUDA) 40 MG TABS tablet Take 40 mg by mouth daily with breakfast.    . Multiple Vitamin (MULTIVITAMIN WITH MINERALS) TABS tablet Take 1 tablet by mouth daily.    . pantoprazole (PROTONIX) 40 MG tablet Take 40 mg by mouth daily.    . polyethylene glycol-electrolytes (NULYTELY/GOLYTELY) 420 g solution As directed 4000 mL 0  . potassium chloride (KLOR-CON) 10 MEQ tablet Take 10 mEq by mouth every other day.    . traZODone (DESYREL) 100 MG tablet TAKE 1 TABLET (100 MG TOTAL) BY MOUTH AT BEDTIME. 90 tablet 2   No current facility-administered medications for this visit.     Musculoskeletal: Strength & Muscle Tone: within normal limits Gait & Station: normal Patient leans: N/A  Psychiatric Specialty Exam: Review of Systems  Neurological:       Memory  loss  Psychiatric/Behavioral: The patient is nervous/anxious.     There were no vitals taken for this visit.There is no height or weight on file to calculate BMI.  General Appearance: Casual and Fairly Groomed  Eye Contact:  Good  Speech:  Clear and Coherent  Volume:  Normal  Mood:  Euthymic  Affect:  Appropriate and Congruent  Thought Process:  Goal Directed  Orientation:  Full (Time, Place, and Person)  Thought Content: Rumination   Suicidal Thoughts:  No  Homicidal Thoughts:  No  Memory:  Immediate;   Fair Recent;   Fair Remote;   Poor  Judgement:  Good  Insight:  Fair  Psychomotor Activity:  Normal  Concentration:  Concentration: Fair and Attention Span: Fair  Recall:  [employer] of Knowledge: Fair  Language: Good  Akathisia:  No  Handed:  Right  AIMS (if indicated): not done  Assets:  Communication Skills Desire for Improvement Physical Health Resilience Social Support Talents/Skills  ADL's:  Intact  Cognition: WNL  Sleep:  Good   Screenings:   Assessment and Plan: This patient is a 59 year old male with a history of posttraumatic stress disorder depression and anxiety.  He has complained several times about short-term memory loss so we will refer him to for neuropsychological testing.  In the meantime he will continue Latuda 40 mg daily for mood stabilization, Cymbalta 60 mg daily for depression, trazodone 100 mg at bedtime for sleep, Xanax 1 mg up to 4 times daily for anxiety and Cogentin 0.5 mg daily for tremor.  He will return to see me in 3 months   [name], MD 03/31/2020, 10:48 AM

## 2020-04-13 DIAGNOSIS — F3181 Bipolar II disorder: Secondary | ICD-10-CM | POA: Diagnosis not present

## 2020-04-13 DIAGNOSIS — E039 Hypothyroidism, unspecified: Secondary | ICD-10-CM | POA: Diagnosis not present

## 2020-04-13 DIAGNOSIS — N183 Chronic kidney disease, stage 3 unspecified: Secondary | ICD-10-CM | POA: Diagnosis not present

## 2020-04-13 DIAGNOSIS — F411 Generalized anxiety disorder: Secondary | ICD-10-CM | POA: Diagnosis not present

## 2020-04-13 DIAGNOSIS — E785 Hyperlipidemia, unspecified: Secondary | ICD-10-CM | POA: Diagnosis not present

## 2020-05-28 DIAGNOSIS — G3184 Mild cognitive impairment, so stated: Secondary | ICD-10-CM | POA: Diagnosis not present

## 2020-05-28 DIAGNOSIS — F411 Generalized anxiety disorder: Secondary | ICD-10-CM | POA: Diagnosis not present

## 2020-05-28 DIAGNOSIS — Z0001 Encounter for general adult medical examination with abnormal findings: Secondary | ICD-10-CM | POA: Diagnosis not present

## 2020-05-28 DIAGNOSIS — E039 Hypothyroidism, unspecified: Secondary | ICD-10-CM | POA: Diagnosis not present

## 2020-05-28 DIAGNOSIS — R7301 Impaired fasting glucose: Secondary | ICD-10-CM | POA: Diagnosis not present

## 2020-05-28 DIAGNOSIS — K219 Gastro-esophageal reflux disease without esophagitis: Secondary | ICD-10-CM | POA: Diagnosis not present

## 2020-05-28 DIAGNOSIS — Z0000 Encounter for general adult medical examination without abnormal findings: Secondary | ICD-10-CM | POA: Diagnosis not present

## 2020-05-28 DIAGNOSIS — F4310 Post-traumatic stress disorder, unspecified: Secondary | ICD-10-CM | POA: Diagnosis not present

## 2020-05-28 DIAGNOSIS — M25569 Pain in unspecified knee: Secondary | ICD-10-CM | POA: Diagnosis not present

## 2020-05-28 DIAGNOSIS — E876 Hypokalemia: Secondary | ICD-10-CM | POA: Diagnosis not present

## 2020-06-02 ENCOUNTER — Telehealth (HOSPITAL_COMMUNITY): Payer: Self-pay | Admitting: *Deleted

## 2020-06-02 DIAGNOSIS — E876 Hypokalemia: Secondary | ICD-10-CM | POA: Diagnosis not present

## 2020-06-02 DIAGNOSIS — E782 Mixed hyperlipidemia: Secondary | ICD-10-CM | POA: Diagnosis not present

## 2020-06-02 DIAGNOSIS — F411 Generalized anxiety disorder: Secondary | ICD-10-CM | POA: Diagnosis not present

## 2020-06-02 DIAGNOSIS — K219 Gastro-esophageal reflux disease without esophagitis: Secondary | ICD-10-CM | POA: Diagnosis not present

## 2020-06-02 DIAGNOSIS — Z0001 Encounter for general adult medical examination with abnormal findings: Secondary | ICD-10-CM | POA: Diagnosis not present

## 2020-06-02 DIAGNOSIS — N1831 Chronic kidney disease, stage 3a: Secondary | ICD-10-CM | POA: Diagnosis not present

## 2020-06-02 DIAGNOSIS — M545 Low back pain: Secondary | ICD-10-CM | POA: Diagnosis not present

## 2020-06-02 DIAGNOSIS — E039 Hypothyroidism, unspecified: Secondary | ICD-10-CM | POA: Diagnosis not present

## 2020-06-02 DIAGNOSIS — G4700 Insomnia, unspecified: Secondary | ICD-10-CM | POA: Diagnosis not present

## 2020-06-02 DIAGNOSIS — F3181 Bipolar II disorder: Secondary | ICD-10-CM | POA: Diagnosis not present

## 2020-06-02 NOTE — Telephone Encounter (Signed)
He takes 40 mg daily. If we don't have 40 mg, he can break 80 mg in half or take 2 20 mg

## 2020-06-02 NOTE — Telephone Encounter (Signed)
Wife aware and stated that they will pick up samples tomorrow and there was some sort of misunderstanding about the amount of dose patient was suppose to be taking but will start patient on the 40 mg.

## 2020-06-02 NOTE — Telephone Encounter (Signed)
Patient wife calling to get refills for patient Latuda 80 mg. Per pt recent chart notes is stating patient is taking 40 mg. Staff would like to know which one to give patient 60 mg?, 40 mg? Or 80 mg?

## 2020-06-29 ENCOUNTER — Telehealth (HOSPITAL_COMMUNITY): Payer: Medicare HMO | Admitting: Psychiatry

## 2020-07-02 ENCOUNTER — Telehealth (HOSPITAL_COMMUNITY): Payer: Medicare HMO | Admitting: Psychiatry

## 2020-07-05 ENCOUNTER — Telehealth (HOSPITAL_COMMUNITY): Payer: Medicare HMO | Admitting: Psychiatry

## 2020-07-06 ENCOUNTER — Telehealth (INDEPENDENT_AMBULATORY_CARE_PROVIDER_SITE_OTHER): Payer: Medicare HMO | Admitting: Psychiatry

## 2020-07-06 ENCOUNTER — Encounter (HOSPITAL_COMMUNITY): Payer: Self-pay | Admitting: Psychiatry

## 2020-07-06 ENCOUNTER — Other Ambulatory Visit: Payer: Self-pay

## 2020-07-06 DIAGNOSIS — F332 Major depressive disorder, recurrent severe without psychotic features: Secondary | ICD-10-CM | POA: Diagnosis not present

## 2020-07-06 DIAGNOSIS — F419 Anxiety disorder, unspecified: Secondary | ICD-10-CM

## 2020-07-06 MED ORDER — TRAZODONE HCL 100 MG PO TABS: ORAL_TABLET | ORAL | 2 refills | Status: DC

## 2020-07-06 MED ORDER — ALPRAZOLAM 1 MG PO TABS: 1.0000 mg | ORAL_TABLET | Freq: Four times a day (QID) | ORAL | 2 refills | Status: DC

## 2020-07-06 MED ORDER — BENZTROPINE MESYLATE 0.5 MG PO TABS: 0.5000 mg | ORAL_TABLET | Freq: Two times a day (BID) | ORAL | 2 refills | Status: DC

## 2020-07-06 MED ORDER — DULOXETINE HCL 60 MG PO CPEP: 60.0000 mg | ORAL_CAPSULE | Freq: Every day | ORAL | 2 refills | Status: DC

## 2020-07-06 NOTE — Progress Notes (Signed)
Virtual Visit via Video Note  I connected with Greg Kemp on 07/06/20 at  1:40 PM EDT by a video enabled telemedicine application and verified that I am speaking with the correct person using two identifiers.   I discussed the limitations of evaluation and management by telemedicine and the availability of in person appointments. The patient expressed understanding and agreed to proceed.     I discussed the assessment and treatment plan with the patient. The patient was provided an opportunity to ask questions and all were answered. The patient agreed with the plan and demonstrated an understanding of the instructions.   The patient was advised to call back or seek an in-person evaluation if the symptoms worsen or if the condition fails to improve as anticipated.  I provided 15 minutes of non-face-to-face time during this encounter. Location: Provider office, patient home  [name], MD  [facility] MD/PA/NP OP Progress Note  07/06/2020 2:00 PM Greg Kemp  MRN:  [id number]  Chief Complaint:  Chief Complaint    Depression; Anxiety; Follow-up     HPI: This patient is a 59 year old white male lives with his wife in [place].  He is on disability.  The patient returns for follow-up after 3 months regarding his posttraumatic stress disorder and anxiety.  For the most part he is doing well.  He still complains of short-term memory loss.  I thought I had put in a referral to psychology for testing but I see that no one has gotten back to him.  He continues to sleep fairly well.  He is on a lower dose of Latuda now and he seems sharper and less tired.  I suggest that we keep trying to cut it back and perhaps eventually go off of it as it has the most side effects of all of his medications.  His mood is generally good and he denies thoughts of self-harm or suicide or auditory visual hallucinations. Visit Diagnosis:    ICD-10-CM   1. Major depressive disorder, recurrent, severe without  psychotic features ([place])  F33.2   2. Anxiety  F41.9     Past Psychiatric History: Several past hospitalizations but none since 2012  Past Medical History:  Past Medical History:  Diagnosis Date  . Acid reflux   . Anxiety   . Chronic kidney disease    Stage III  . Depression   . Depression   . GERD (gastroesophageal reflux disease)   . Hyperlipidemia   . Morbid obesity ([place])   . Sleep apnea   . Thyroid disease     Past Surgical History:  Procedure Laterality Date  . COLONOSCOPY WITH ESOPHAGOGASTRODUODENOSCOPY (EGD)  2010   Dr. Gala Romney: ulcerative/erosive reflux esophagitis, noncritical schatki's ring, multiple bulbar erosions with normal sb biopsies, colonic diverticulosis, normal TI  . COLONOSCOPY WITH PROPOFOL N/A 10/30/2019   Procedure: COLONOSCOPY WITH PROPOFOL;  Surgeon: Daneil Dolin, MD;  [facility];  Service: Endoscopy;  Laterality: N/A;  1:30pm  . POLYPECTOMY  10/30/2019   Procedure: POLYPECTOMY;  Surgeon: Daneil Dolin, MD;  [facility];  Service: Endoscopy;;  . TONSILLECTOMY    . Upper and Lower GI      Family Psychiatric History: see below  Family History:  Family History  Problem Relation Age of Onset  . Alcohol abuse Father   . Colon cancer Father   . COPD Father   . Depression Daughter        acute  . Sexual abuse  Daughter   . Lung disease Mother   . COPD Mother   . Thyroid disease Mother   . Alcohol abuse Paternal Uncle   . Colon cancer Daughter 78  . ADD / ADHD Neg Hx   . Anxiety disorder Neg Hx   . OCD Neg Hx   . Drug abuse Neg Hx   . Bipolar disorder Neg Hx   . Dementia Neg Hx   . Paranoid behavior Neg Hx   . Schizophrenia Neg Hx   . Seizures Neg Hx   . Physical abuse Neg Hx     Social History:  Social History   Socioeconomic History  . Marital status: Married    Spouse name: Not on file  . Number of children: Not on file  . Years of education: Not on file  . Highest education level: Not on file   Occupational History  . Not on file  Tobacco Use  . Smoking status: Never Smoker  . Smokeless tobacco: Never Used  Substance and Sexual Activity  . Alcohol use: Yes    Comment: occ  . Drug use: No  . Sexual activity: Yes    Birth control/protection: Surgical  Other Topics Concern  . Not on file  Social History Narrative  . Not on file   Social Determinants of Health   Financial Resource Strain:   . Difficulty of Paying Living Expenses: Not on file  Food Insecurity:   . Worried About Charity fundraiser in the Last Year: Not on file  . Ran Out of Food in the Last Year: Not on file  Transportation Needs:   . Lack of Transportation (Medical): Not on file  . Lack of Transportation (Non-Medical): Not on file  Physical Activity:   . Days of Exercise per Week: Not on file  . Minutes of Exercise per Session: Not on file  Stress:   . Feeling of Stress : Not on file  Social Connections:   . Frequency of Communication with Friends and Family: Not on file  . Frequency of Social Gatherings with Friends and Family: Not on file  . Attends Religious Services: Not on file  . Active Member of Clubs or Organizations: Not on file  . Attends Archivist Meetings: Not on file  . Marital Status: Not on file    Allergies: No Known Allergies  Metabolic Disorder Labs: No results found for: HGBA1C, MPG No results found for: PROLACTIN Lab Results  Component Value Date   CHOL  06/16/2010    166        ATP III CLASSIFICATION:  <200     mg/dL   Desirable  200-239  mg/dL   Borderline High  >=240    mg/dL   High          TRIG 166 (H) 06/16/2010   HDL 29 (L) 06/16/2010   CHOLHDL 5.7 06/16/2010   VLDL 33 06/16/2010   LDLCALC (H) 06/16/2010    104        Total Cholesterol/HDL:CHD Risk Coronary Heart Disease Risk Table                     Men   Women  1/2 Average Risk   3.4   3.3  Average Risk       5.0   4.4  2 X Average Risk   9.6   7.1  3 X Average Risk  23.4   11.0  Use the calculated Patient Ratio above and the CHD Risk Table to determine the patient's CHD Risk.        ATP III CLASSIFICATION (LDL):  <100     mg/dL   Optimal  100-129  mg/dL   Near or Above                    Optimal  130-159  mg/dL   Borderline  160-189  mg/dL   High  >190     mg/dL   Very High   Lab Results  Component Value Date   TSH 11.645 (H) 12/14/2010   TSH 0.264 (L) 06/16/2010    Therapeutic Level Labs: No results found for: LITHIUM No results found for: VALPROATE No components found for:  CBMZ  Current Medications: Current Outpatient Medications  Medication Sig Dispense Refill  . acidophilus (RISAQUAD) CAPS capsule Take 1 capsule by mouth daily.    Marland Kitchen ALPRAZolam (XANAX) 1 MG tablet Take 1 tablet (1 mg total) by mouth 4 (four) times daily. 120 tablet 2  . benztropine (COGENTIN) 0.5 MG tablet Take 1 tablet (0.5 mg total) by mouth 2 (two) times daily. 90 tablet 2  . DULoxetine (CYMBALTA) 60 MG capsule Take 1 capsule (60 mg total) by mouth daily. 90 capsule 2  . gabapentin (NEURONTIN) 300 MG capsule Take 300 mg by mouth 3 (three) times daily as needed (pain).    Marland Kitchen levothyroxine (SYNTHROID) 175 MCG tablet Take 175 mcg by mouth daily before breakfast.     . lurasidone (LATUDA) 40 MG TABS tablet Take 40 mg by mouth daily with breakfast.    . Multiple Vitamin (MULTIVITAMIN WITH MINERALS) TABS tablet Take 1 tablet by mouth daily.    . pantoprazole (PROTONIX) 40 MG tablet Take 40 mg by mouth daily.    . polyethylene glycol-electrolytes (NULYTELY/GOLYTELY) 420 g solution As directed 4000 mL 0  . potassium chloride (KLOR-CON) 10 MEQ tablet Take 10 mEq by mouth every other day.    . traZODone (DESYREL) 100 MG tablet TAKE 1 TABLET (100 MG TOTAL) BY MOUTH AT BEDTIME. 90 tablet 2   No current facility-administered medications for this visit.     Musculoskeletal: Strength & Muscle Tone: within normal limits Gait & Station: normal Patient leans: N/A  Psychiatric Specialty  Exam: Review of Systems  There were no vitals taken for this visit.There is no height or weight on file to calculate BMI.  General Appearance: Casual and Fairly Groomed  Eye Contact:  Good  Speech:  Clear and Coherent  Volume:  Normal  Mood:  Euthymic  Affect:  Appropriate and Congruent  Thought Process:  Goal Directed  Orientation:  Full (Time, Place, and Person)  Thought Content: WDL   Suicidal Thoughts:  No  Homicidal Thoughts:  No  Memory:  Immediate;   Fair Recent;   Poor Remote;   NA  Judgement:  Good  Insight:  Fair  Psychomotor Activity:  Normal  Concentration:  Concentration: Fair and Attention Span: Fair  Recall:  Poor  Fund of Knowledge: Fair  Language: Good  Akathisia:  No  Handed:  Right  AIMS (if indicated): not done  Assets:  Communication Skills Desire for Improvement Physical Health Resilience Social Support Talents/Skills  ADL's:  Intact  Cognition: Impaired,  Mild  Sleep:  Good   Screenings:   Assessment and Plan: This patient is a 59 year old male with a history of posttraumatic stress disorder depression and anxiety.  We will again try to put in  a referral to psychology to test his memory.  In the meantime he will cut down Latuda to 20 mg daily for mood stabilization, cut down Cogentin to 0.5 mg only once a day for tremor and continue Cymbalta 60 mg daily for depression, trazodone 100 mg at bedtime for sleep and Xanax 1 mg up to four times daily for anxiety.  He will return to see me in 3 months   [name], MD 07/06/2020, 2:00 PM

## 2020-07-09 ENCOUNTER — Encounter: Payer: Self-pay | Admitting: Psychology

## 2020-07-14 DIAGNOSIS — E876 Hypokalemia: Secondary | ICD-10-CM | POA: Diagnosis not present

## 2020-07-14 DIAGNOSIS — F411 Generalized anxiety disorder: Secondary | ICD-10-CM | POA: Diagnosis not present

## 2020-07-14 DIAGNOSIS — Z0000 Encounter for general adult medical examination without abnormal findings: Secondary | ICD-10-CM | POA: Diagnosis not present

## 2020-07-14 DIAGNOSIS — K219 Gastro-esophageal reflux disease without esophagitis: Secondary | ICD-10-CM | POA: Diagnosis not present

## 2020-07-14 DIAGNOSIS — G3184 Mild cognitive impairment, so stated: Secondary | ICD-10-CM | POA: Diagnosis not present

## 2020-07-14 DIAGNOSIS — M25569 Pain in unspecified knee: Secondary | ICD-10-CM | POA: Diagnosis not present

## 2020-07-14 DIAGNOSIS — Z0001 Encounter for general adult medical examination with abnormal findings: Secondary | ICD-10-CM | POA: Diagnosis not present

## 2020-07-14 DIAGNOSIS — E039 Hypothyroidism, unspecified: Secondary | ICD-10-CM | POA: Diagnosis not present

## 2020-07-14 DIAGNOSIS — F4310 Post-traumatic stress disorder, unspecified: Secondary | ICD-10-CM | POA: Diagnosis not present

## 2020-07-27 ENCOUNTER — Ambulatory Visit: Payer: Medicare HMO | Admitting: Psychology

## 2020-07-29 ENCOUNTER — Encounter: Payer: Medicare HMO | Admitting: Psychology

## 2020-09-22 ENCOUNTER — Ambulatory Visit: Payer: Medicare HMO | Admitting: Neurology

## 2020-10-05 ENCOUNTER — Telehealth (HOSPITAL_COMMUNITY): Payer: Medicare HMO | Admitting: Psychiatry

## 2020-10-05 ENCOUNTER — Other Ambulatory Visit: Payer: Self-pay

## 2020-10-12 ENCOUNTER — Telehealth (INDEPENDENT_AMBULATORY_CARE_PROVIDER_SITE_OTHER): Payer: Medicare HMO | Admitting: Psychiatry

## 2020-10-12 ENCOUNTER — Other Ambulatory Visit: Payer: Self-pay

## 2020-10-12 ENCOUNTER — Encounter (HOSPITAL_COMMUNITY): Payer: Self-pay | Admitting: Psychiatry

## 2020-10-12 DIAGNOSIS — F419 Anxiety disorder, unspecified: Secondary | ICD-10-CM

## 2020-10-12 DIAGNOSIS — F332 Major depressive disorder, recurrent severe without psychotic features: Secondary | ICD-10-CM

## 2020-10-12 MED ORDER — LATUDA 20 MG PO TABS: 20.0000 mg | ORAL_TABLET | Freq: Every day | ORAL | 2 refills | Status: DC

## 2020-10-12 MED ORDER — ALPRAZOLAM 1 MG PO TABS: 1.0000 mg | ORAL_TABLET | Freq: Four times a day (QID) | ORAL | 2 refills | Status: DC

## 2020-10-12 NOTE — Progress Notes (Signed)
Virtual Visit via Telephone Note  I connected with Greg Kemp on 10/12/20 at 11:40 AM EST by telephone and verified that I am speaking with the correct person using two identifiers.  Location: Patient: home Provider: office   I discussed the limitations, risks, security and privacy concerns of performing an evaluation and management service by telephone and the availability of in person appointments. I also discussed with the patient that there may be a patient responsible charge related to this service. The patient expressed understanding and agreed to proceed.    I discussed the assessment and treatment plan with the patient. The patient was provided an opportunity to ask questions and all were answered. The patient agreed with the plan and demonstrated an understanding of the instructions.   The patient was advised to call back or seek an in-person evaluation if the symptoms worsen or if the condition fails to improve as anticipated.  I provided 15 minutes of non-face-to-face time during this encounter.   Diannia Ruder, MD  [facility] MD/PA/NP OP Progress Note  10/12/2020 11:53 AM Greg Kemp  MRN:  [id number]  Chief Complaint:  Chief Complaint    Anxiety; Follow-up; Family Problem     HPI: This patient is a 60 year old white male lives with his wife in [place].  He is on disability.  The patient returns for follow-up after 3 months regarding his posttraumatic stress disorder and anxiety.  He states that he has been doing very well.  He got to see his family over Christmas including 14 grandchildren.  Right now is helping take care of his wife who is recovering from hip surgery.  He is doing well on the lower dose of Latuda-20 mg daily and does not have any reemergence of depressive symptoms or mood swings.  He is generally sleeping well.  He still having some problems with memory but it does not seem to be as bad as it the past.  He states that he had 1 week a couple months ago  bad mood but it subsided and he is feeling much better now.  He denies thoughts of self-harm or suicidality. Visit Diagnosis:    ICD-10-CM   1. Major depressive disorder, recurrent, severe without psychotic features (HCC)  F33.2   2. Anxiety  F41.9     Past Psychiatric History: Several past hospitalizations but none since 2012  Past Medical History:  Past Medical History:  Diagnosis Date  . Acid reflux   . Anxiety   . Chronic kidney disease    Stage III  . Depression   . Depression   . GERD (gastroesophageal reflux disease)   . Hyperlipidemia   . Morbid obesity (HCC)   . Sleep apnea   . Thyroid disease     Past Surgical History:  Procedure Laterality Date  . COLONOSCOPY WITH ESOPHAGOGASTRODUODENOSCOPY (EGD)  2010   Dr. Jena Gauss: ulcerative/erosive reflux esophagitis, noncritical schatki's ring, multiple bulbar erosions with normal sb biopsies, colonic diverticulosis, normal TI  . COLONOSCOPY WITH PROPOFOL N/A 10/30/2019   Procedure: COLONOSCOPY WITH PROPOFOL;  Surgeon: Corbin Ade, MD;  [facility]: [facility];  Service: Endoscopy;  Laterality: N/A;  1:30pm  . POLYPECTOMY  10/30/2019   Procedure: POLYPECTOMY;  Surgeon: Corbin Ade, MD;  Location: [facility];  Service: Endoscopy;;  . TONSILLECTOMY    . Upper and Lower GI      Family Psychiatric History: see below  Family History:  Family History  Problem Relation Age of Onset  .  Alcohol abuse Father   . Colon cancer Father   . COPD Father   . Depression Daughter        acute  . Sexual abuse Daughter   . Lung disease Mother   . COPD Mother   . Thyroid disease Mother   . Alcohol abuse Paternal Uncle   . Colon cancer Daughter 69  . ADD / ADHD Neg Hx   . Anxiety disorder Neg Hx   . OCD Neg Hx   . Drug abuse Neg Hx   . Bipolar disorder Neg Hx   . Dementia Neg Hx   . Paranoid behavior Neg Hx   . Schizophrenia Neg Hx   . Seizures Neg Hx   . Physical abuse Neg Hx     Social History:  Social History    Socioeconomic History  . Marital status: Married    Spouse name: Not on file  . Number of children: Not on file  . Years of education: Not on file  . Highest education level: Not on file  Occupational History  . Not on file  Tobacco Use  . Smoking status: Never Smoker  . Smokeless tobacco: Never Used  Substance and Sexual Activity  . Alcohol use: Yes    Comment: occ  . Drug use: No  . Sexual activity: Yes    Birth control/protection: Surgical  Other Topics Concern  . Not on file  Social History Narrative  . Not on file   Social Determinants of Health   Financial Resource Strain: Not on file  Food Insecurity: Not on file  Transportation Needs: Not on file  Physical Activity: Not on file  Stress: Not on file  Social Connections: Not on file    Allergies: No Known Allergies  Metabolic Disorder Labs: No results found for: HGBA1C, MPG No results found for: PROLACTIN Lab Results  Component Value Date   CHOL  06/16/2010    166        ATP III CLASSIFICATION:  <200     mg/dL   Desirable  200-239  mg/dL   Borderline High  >=240    mg/dL   High          TRIG 166 (H) 06/16/2010   HDL 29 (L) 06/16/2010   CHOLHDL 5.7 06/16/2010   VLDL 33 06/16/2010   LDLCALC (H) 06/16/2010    104        Total Cholesterol/HDL:CHD Risk Coronary Heart Disease Risk Table                     Men   Women  1/2 Average Risk   3.4   3.3  Average Risk       5.0   4.4  2 X Average Risk   9.6   7.1  3 X Average Risk  23.4   11.0        Use the calculated Patient Ratio above and the CHD Risk Table to determine the patient's CHD Risk.        ATP III CLASSIFICATION (LDL):  <100     mg/dL   Optimal  100-129  mg/dL   Near or Above                    Optimal  130-159  mg/dL   Borderline  160-189  mg/dL   High  >190     mg/dL   Very High   Lab Results  Component Value Date   TSH  11.645 (H) 12/14/2010   TSH 0.264 (L) 06/16/2010    Therapeutic Level Labs: No results found for:  LITHIUM No results found for: VALPROATE No components found for:  CBMZ  Current Medications: Current Outpatient Medications  Medication Sig Dispense Refill  . lurasidone (LATUDA) 20 MG TABS tablet Take 1 tablet (20 mg total) by mouth daily. 30 tablet 2  . acidophilus (RISAQUAD) CAPS capsule Take 1 capsule by mouth daily.    Marland Kitchen ALPRAZolam (XANAX) 1 MG tablet Take 1 tablet (1 mg total) by mouth 4 (four) times daily. 120 tablet 2  . benztropine (COGENTIN) 0.5 MG tablet Take 1 tablet (0.5 mg total) by mouth 2 (two) times daily. 90 tablet 2  . DULoxetine (CYMBALTA) 60 MG capsule Take 1 capsule (60 mg total) by mouth daily. 90 capsule 2  . gabapentin (NEURONTIN) 300 MG capsule Take 300 mg by mouth 3 (three) times daily as needed (pain).    Marland Kitchen levothyroxine (SYNTHROID) 175 MCG tablet Take 175 mcg by mouth daily before breakfast.     . Multiple Vitamin (MULTIVITAMIN WITH MINERALS) TABS tablet Take 1 tablet by mouth daily.    . pantoprazole (PROTONIX) 40 MG tablet Take 40 mg by mouth daily.    . polyethylene glycol-electrolytes (NULYTELY/GOLYTELY) 420 g solution As directed 4000 mL 0  . potassium chloride (KLOR-CON) 10 MEQ tablet Take 10 mEq by mouth every other day.    . traZODone (DESYREL) 100 MG tablet TAKE 1 TABLET (100 MG TOTAL) BY MOUTH AT BEDTIME. 90 tablet 2   No current facility-administered medications for this visit.     Musculoskeletal: Strength & Muscle Tone: within normal limits Gait & Station: normal Patient leans: N/A  Psychiatric Specialty Exam: Review of Systems  All other systems reviewed and are negative.   There were no vitals taken for this visit.There is no height or weight on file to calculate BMI.  General Appearance: NA  Eye Contact:  NA  Speech:  Clear and Coherent  Volume:  Normal  Mood:  Euthymic  Affect:  NA  Thought Process:  Goal Directed  Orientation:  Full (Time, Place, and Person)  Thought Content: WDL   Suicidal Thoughts:  No  Homicidal Thoughts:   No  Memory:  Immediate;   Good Recent;   Good Remote;   Fair  Judgement:  good  Insight:  Good  Psychomotor Activity:  Normal  Concentration:  Concentration: Fair and Attention Span: Fair  Recall:  [employer] of Knowledge: Good  Language: Good  Akathisia:  No  Handed:  Right  AIMS (if indicated): not done  Assets:  Communication Skills Desire for Improvement Physical Health Resilience Social Support Talents/Skills  ADL's:  Intact  Cognition: WNL  Sleep:  Good   Screenings:   Assessment and Plan: This patient is a 49-year-old male with a history of posttraumatic stress disorder depression and anxiety.  Since we have cut down Latuda his memory seems to be a little bit better.  He will continue with 20 mg daily for mood stabilization and Cogentin 0.5 mg daily for tremor.  He will continue Cymbalta 60 mg daily for depression, trazodone 100 mg at bedtime for sleep and Xanax 1 mg up to 4 times daily for anxiety.  He will return to see me in 3 months   [name], MD 10/12/2020, 11:53 AM

## 2020-11-01 ENCOUNTER — Encounter: Payer: Medicare HMO | Attending: Psychology | Admitting: Psychology

## 2020-11-01 ENCOUNTER — Other Ambulatory Visit: Payer: Self-pay

## 2020-11-01 DIAGNOSIS — F4312 Post-traumatic stress disorder, chronic: Secondary | ICD-10-CM

## 2020-11-01 DIAGNOSIS — F332 Major depressive disorder, recurrent severe without psychotic features: Secondary | ICD-10-CM | POA: Diagnosis not present

## 2020-11-01 DIAGNOSIS — F5105 Insomnia due to other mental disorder: Secondary | ICD-10-CM | POA: Diagnosis not present

## 2020-11-01 DIAGNOSIS — F319 Bipolar disorder, unspecified: Secondary | ICD-10-CM

## 2020-11-01 DIAGNOSIS — R413 Other amnesia: Secondary | ICD-10-CM | POA: Diagnosis not present

## 2020-11-14 ENCOUNTER — Encounter: Payer: Self-pay | Admitting: Psychology

## 2020-11-14 NOTE — Progress Notes (Signed)
Neuropsychological Consultation   Patient:   Greg Kemp   DOB:   [birth year]  MR Number:  [id number]  Location:  [facility] [place], [place] [id number] [place] [address] Dept: [phone]           Date of Service:   11/01/2020  Start Time:   2 PM End Time:   4 PM  Today's visit was an in person visit that was conducted in my outpatient clinic office with the patient and myself present.  1 hour and 15 minutes was spent in clinical interview and the other 45 minutes was spent with records review and determining testing battery for formal neuropsychological testing.  Provider/Observer:  Ilean Skill, Psy.D.       Clinical Neuropsychologist       Billing Code/Service: 96116/96121  Chief Complaint:    Greg Kemp is a 60 year old male who was referred for neuropsychological evaluation by his treating psychiatrist [name], MD.  Dr. Harrington Challenger is followed the patient for some time for issues related to major depressive disorder recurrent severe without psychotic features, anxiety the patient has a significant past history of severe chronic posttraumatic stress disorder.  I also saw the patient in the [facility] outpatient program between 2014 and 2018 for severe major depressive episodes and chronic posttraumatic stress disorder therapeutically.  The patient is continuing to describe significant memory difficulties and Dr. Harrington Challenger felt it would be pertinent to get formal assessment with regard to his memory deficits.  Reason for Service:  Greg Kemp is a 60 year old male who was referred for neuropsychological evaluation by his treating psychiatrist [name], MD.  Dr. Harrington Challenger is followed the patient for some time for issues related to major depressive disorder recurrent severe without psychotic features, anxiety the patient has a significant past history of  severe chronic posttraumatic stress disorder.  I also saw the patient in the [facility] outpatient program between 2014 and 2018 for severe major depressive episodes and chronic posttraumatic stress disorder therapeutically.  The patient is continuing to describe significant memory difficulties and Dr. Harrington Challenger felt it would be pertinent to get formal assessment with regard to his memory deficits.  Patient has a past medical history including previous diagnosis of bipolar 1 disorder versus major depressive disorder with chronic PTSD.  He also has episodes of obesity, significant insomnia, anxiety and pain symptoms.  Hypothyroidism, bronchitis, GERD, irritable bowel syndrome, and chest pain are all noted issues with chest pain likely due to acid reflux.  During the clinical interview, the patient reports that he has continued to see Dr. Dimas Chyle feels like he is being well managed with regard to his depression and there have been recent helpful changes in medication strategies.  The patient reports he continues to have significant memory difficulties which he noted back when I saw him between 2014 and 2018.  He reports that there have not been significant changes of note.  The patient reports that he feels like he just cannot "remember anything."  The patient reports that he has difficulty remembering various family members life history and remembering aspects of friends and events in his children's life.  The patient reports that the symptoms of been like that since the beginning of his difficulties but feels like they may be getting somewhat worse.  The patient reports he repeatedly forgets things like what he is talking about in conversation and important  life events.  He recently attempted to change the oil in his truck and forgot how he should go about doing that even though he has done this procedure many times in the past.  The patient reports that he will start reading things such as books  and cannot remember earlier chapters in the books.  The patient reports that he forgets people's names and has significant attention and short-term memory difficulties present.  The patient denies any tremor, visual or auditory hallucinations or significant changes in gait.  The patient reports that he has had previous concussions.  The patient reports that he suffered head traumas at various times growing up.  The patient reports that he once fell from a loft while climbing a ladder into the loft falling in the loft coming down.  The patient reports that he did see a doctor around this time but no MRIs or other imaging was done.  The patient reports that he had seizure as a child and took phenobarbital at 1 point.  The patient reports that he gets between 6 and 8 hours of sleep with occasional bouts of insomnia.  He describes his appetite is good.  The patient has concerns about his difficulties as he "feels like he is embarrassing his family with his difficulty and he feels like he has been getting more more information.  Behavioral Observation: LEDGER HEINDL  presents as a 60 y.o.-year-old Right handed Caucasian Male who appeared his stated age. his dress was Appropriate and he was Well Groomed and his manners were Appropriate to the situation.  his participation was indicative of Appropriate and Redirectable behaviors.  There were not physical disabilities noted.  he displayed an appropriate level of cooperation and motivation.     Interactions:    Active Appropriate and Inattentive  Attention:   abnormal and attention span appeared shorter than expected for age  Memory:   abnormal; global memory impairment noted  Visuo-spatial:  not examined  Speech (Volume):  normal  Speech:   normal; some word finding noted.  Thought Process:  Coherent and Relevant  Though Content:  WNL; not suicidal and not homicidal  Orientation:   person, place, time/date and  situation  Judgment:   Fair  Planning:   Poor  Affect:    Anxious and Depressed  Mood:    Dysphoric  Insight:   Good  Intelligence:   normal  Marital Status/Living: The patient was born and raised in [place] along with 1 sibling.  Developmental milestones were reached at the appropriate time.  The patient continues to live with his wife of 4 years and they have adult children.  Current Employment: The patient is currently disabled and has not been working for some time.  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  Hobbies and interests have included going to the gym and he likes to do various projects with his hand whenever he can.  Substance Use:  No concerns of substance abuse are reported.  The patient rarely consumes alcohol and has about 1 beer per month with a meal.  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  Medical History:   Past Medical History:  Diagnosis Date  . Acid reflux   .  Anxiety   . Chronic kidney disease    Stage III  . Depression   . Depression   . GERD (gastroesophageal reflux disease)   . Hyperlipidemia   . Morbid obesity ([place])   . Sleep apnea   . Thyroid disease          Patient Active Problem List   Diagnosis Date Noted  . Constipation 08/11/2019  . FH: colon cancer in first degree relative <17 years old 08/11/2019  . Memory difficulties 01/23/2013  . Insomnia due to mental disorder 08/13/2012  . Anxiety 08/13/2012  . Pain 08/13/2012  . Bipolar 1 disorder ([place]) 06/28/2010  . CHEST PAIN 06/28/2010  . IRRITABLE BOWEL SYNDROME 05/14/2009  . MELENA, HX OF 02/23/2009  . HYPOTHYROIDISM 11/17/2008  . BRONCHITIS 11/17/2008  . GERD 11/17/2008  . NAUSEA AND VOMITING 11/17/2008   . Diarrhea 11/17/2008  . ABDOMINAL PAIN, LEFT LOWER QUADRANT 11/17/2008  . TONSILLECTOMY, HX OF 11/17/2008              Abuse/Trauma History: The patient has had numerous traumatic experiences working related to death in [place] and had a significant traumatic experience after going to check on him a) and finding him dead.  Psychiatric History:  Patient has a significant history of chronic posttraumatic stress disorder as well as anxiety and depression.  Family Med/Psych History:  Family History  Problem Relation Age of Onset  . Alcohol abuse Father   . Colon cancer Father   . COPD Father   . Depression Daughter        acute  . Sexual abuse Daughter   . Lung disease Mother   . COPD Mother   . Thyroid disease Mother   . Alcohol abuse Paternal Uncle   . Colon cancer Daughter 30  . ADD / ADHD Neg Hx   . Anxiety disorder Neg Hx   . OCD Neg Hx   . Drug abuse Neg Hx   . Bipolar disorder Neg Hx   . Dementia Neg Hx   . Paranoid behavior Neg Hx   . Schizophrenia Neg Hx   . Seizures Neg Hx   . Physical abuse Neg Hx     Impression/DX:  AMERION THONG is a 60 year old male who was referred for neuropsychological evaluation by his treating psychiatrist [name], MD.  Dr. Harrington Challenger is followed the patient for some time for issues related to major depressive disorder recurrent severe without psychotic features, anxiety the patient has a significant past history of severe chronic posttraumatic stress disorder.  I also saw the patient in the [facility] outpatient program between 2014 and 2018 for severe major depressive episodes and chronic posttraumatic stress disorder therapeutically.  The patient is continuing to describe significant memory difficulties and Dr. Harrington Challenger felt it would be pertinent to get formal assessment with regard to his memory deficits.  Disposition/Plan:  We have set the patient up for formal neuropsychological testing including the Wechsler Adult  Intelligence Scale, the Wechsler Memory Scale and other various neuropsychological measures.  Once this formal objective assessment is completed a full report will be produced and made available to his referring physician Dr. Harrington Challenger.  It will also be available in his epic records/EMR.  Diagnosis:    Memory difficulties  Chronic posttraumatic stress disorder  Insomnia due to mental disorder  Severe episode of recurrent major depressive disorder, without psychotic features ([place])  Bipolar 1 disorder ([place])         Electronically Signed   _______________________  Ilean Skill, Psy.D. Clinical Neuropsychologist

## 2020-11-25 DIAGNOSIS — F411 Generalized anxiety disorder: Secondary | ICD-10-CM | POA: Diagnosis not present

## 2020-11-25 DIAGNOSIS — Z0000 Encounter for general adult medical examination without abnormal findings: Secondary | ICD-10-CM | POA: Diagnosis not present

## 2020-11-25 DIAGNOSIS — Z0001 Encounter for general adult medical examination with abnormal findings: Secondary | ICD-10-CM | POA: Diagnosis not present

## 2020-11-25 DIAGNOSIS — F4310 Post-traumatic stress disorder, unspecified: Secondary | ICD-10-CM | POA: Diagnosis not present

## 2020-11-25 DIAGNOSIS — K219 Gastro-esophageal reflux disease without esophagitis: Secondary | ICD-10-CM | POA: Diagnosis not present

## 2020-11-25 DIAGNOSIS — E876 Hypokalemia: Secondary | ICD-10-CM | POA: Diagnosis not present

## 2020-11-25 DIAGNOSIS — G3184 Mild cognitive impairment, so stated: Secondary | ICD-10-CM | POA: Diagnosis not present

## 2020-11-25 DIAGNOSIS — M25569 Pain in unspecified knee: Secondary | ICD-10-CM | POA: Diagnosis not present

## 2020-11-25 DIAGNOSIS — E039 Hypothyroidism, unspecified: Secondary | ICD-10-CM | POA: Diagnosis not present

## 2020-12-01 ENCOUNTER — Ambulatory Visit: Payer: Medicare HMO | Admitting: Neurology

## 2020-12-01 ENCOUNTER — Encounter: Payer: Self-pay | Admitting: Neurology

## 2020-12-01 VITALS — BP 134/86 | HR 63 | Ht 72.0 in | Wt 299.0 lb

## 2020-12-01 DIAGNOSIS — R0681 Apnea, not elsewhere classified: Secondary | ICD-10-CM | POA: Diagnosis not present

## 2020-12-01 DIAGNOSIS — R413 Other amnesia: Secondary | ICD-10-CM | POA: Diagnosis not present

## 2020-12-01 DIAGNOSIS — R634 Abnormal weight loss: Secondary | ICD-10-CM | POA: Diagnosis not present

## 2020-12-01 DIAGNOSIS — F3181 Bipolar II disorder: Secondary | ICD-10-CM | POA: Diagnosis not present

## 2020-12-01 DIAGNOSIS — G4700 Insomnia, unspecified: Secondary | ICD-10-CM | POA: Diagnosis not present

## 2020-12-01 DIAGNOSIS — N1831 Chronic kidney disease, stage 3a: Secondary | ICD-10-CM | POA: Diagnosis not present

## 2020-12-01 DIAGNOSIS — E039 Hypothyroidism, unspecified: Secondary | ICD-10-CM | POA: Diagnosis not present

## 2020-12-01 DIAGNOSIS — E876 Hypokalemia: Secondary | ICD-10-CM | POA: Diagnosis not present

## 2020-12-01 DIAGNOSIS — R410 Disorientation, unspecified: Secondary | ICD-10-CM

## 2020-12-01 DIAGNOSIS — G4719 Other hypersomnia: Secondary | ICD-10-CM

## 2020-12-01 DIAGNOSIS — R0683 Snoring: Secondary | ICD-10-CM

## 2020-12-01 DIAGNOSIS — Z23 Encounter for immunization: Secondary | ICD-10-CM | POA: Diagnosis not present

## 2020-12-01 DIAGNOSIS — F39 Unspecified mood [affective] disorder: Secondary | ICD-10-CM | POA: Diagnosis not present

## 2020-12-01 DIAGNOSIS — E782 Mixed hyperlipidemia: Secondary | ICD-10-CM | POA: Diagnosis not present

## 2020-12-01 DIAGNOSIS — K219 Gastro-esophageal reflux disease without esophagitis: Secondary | ICD-10-CM | POA: Diagnosis not present

## 2020-12-01 DIAGNOSIS — F411 Generalized anxiety disorder: Secondary | ICD-10-CM | POA: Diagnosis not present

## 2020-12-01 NOTE — Patient Instructions (Signed)
You have complaints of memory loss: memory loss or changes in cognitive function can have many reasons and does not always mean you have dementia. Conditions that can contribute to subjective or objective memory loss include: depression, stress, poor sleep from insomnia or sleep apnea, dehydration, fluctuation in blood sugar values, thyroid or electrolyte dysfunction and certain vitamin deficiencies. Dementia can be caused by stroke, brain atherosclerosis or brain vascular disease due to vascular risk factors (smoking, high blood pressure, high cholesterol, obesity and uncontrolled diabetes), certain degenerative brain disorders (including Parkinson's disease and Multiple sclerosis) and by Alzheimer's disease or other, more rare and sometimes hereditary causes.   We will do some additional testing: blood work (which has been done recently already).  We will do a brain scan, called MRI and call you with the test results. We will have to schedule you for this on a separate date. This test requires authorization from your insurance, and we will take care of the insurance process. You are scheduled also for a cognitive test called neuropsychological evaluation which is done by a licensed neuropsychologist.   We will request a sleep study to look into the possibility of underlying obstructive sleep apnea.  If you have obstructive sleep apnea I will likely recommend treatment with a CPAP or AutoPap machine.    We will call you with brain scan and sleep test results and monitor your symptoms.

## 2020-12-01 NOTE — Progress Notes (Signed)
Subjective:    Patient ID: BLANCA THORNTON is a 60 y.o. male.  HPI     Star Age, MD, PhD [facility] [address] [place], [place] [address]  Dear Loma Sousa,   I saw your patient, Justyn Langham, upon your kind request, in my Neurologic clinic today for initial consultation of his memory loss.  The patient is unaccompanied today. He missed an appointment on 09/22/20. As you know, Mr. Uhde is a 60 year old right-handed gentleman with an underlying medical history of thyroid disease, morbid obesity with a BMI of over 40, sleep apnea (suspected?), hyperlipidemia, reflux disease, depression, anxiety, and chronic kidney disease, who reports worsening short-term or long-term memory issues for the past years.  Per wife, he has had memory issues for 8 or 9 years.  He has had trouble remembering even major events such as their daughter's wedding or the birth of her grandchild.  He gets confused from time to time and has lapses in memory, gets disoriented while driving.  Symptoms started after they lost their adopted daughter to cancer in 2010.  He has had severe bouts of depression, he is in close follow-up with psychiatry and has had multiple medication changes over time.  He has been on [place] for years.  He has been on Cogentin.  He had evaluation for sleep testing some years ago but according to the wife he was not able to sleep.  He is not on a CPAP machine.  He does have daytime somnolence.  He takes trazodone at night for sleep.  I reviewed your office note from 07/14/2020.  Of note, he is on multiple medications including psychotropic medications.  He is followed by Dr. Harrington Challenger in psychiatry.  He is on alprazolam, 1 mg 3 times daily, trazodone 100 mg at night, generic Cymbalta 60 mg daily, gabapentin 300 mg daily as needed and pantoprazole.  He also takes [place] and Cogentin.  He was referred to Dr. Sima Matas by Dr. Harrington Challenger.  He had an appointment with  neuropsychology on 11/01/2020.  He had a remote brain MRI without contrast on 08/26/2013 for syncope, memory loss, headaches and gait unsteadiness for 1 year, I reviewed the results: Impression: Unremarkable brain MRI for age. He is scheduled for cognitive testing with Dr. Ferne Coe office on 12/06/2020.  He lives with his wife.  They have 4 biological children and had 1 adopted child. He worked as a Marine scientist, he quit working in 2012.  He had blood work through your office which showed an abnormal TSH back in August 2021.  He had repeat blood testing recently on 11/25/2020 and we were able to request test results from your office, CBC with differential was unremarkable, CMP showed normal findings with BUN of 11, creatinine 1.22, normal AST and ALT, total cholesterol 141, triglycerides 86, HDL 29, LDL 95.  A1c was 5.2, free T4 mildly elevated at 1.87, TSH low at 0.023. He does not a telltale family history of dementia.  Mom lived to be in her 72s and father lived to be in his 55s.  He is not aware of any memory loss in his grandparents.  He has one half sister but has not had any recent contact with her.  He has been working on weight loss.  In the past 1+ year he has lost about 100 pounds.  He used to work as a Marine scientist in [employer]. Epworth sleepiness score is 4 out of 24.  His Past Medical History Is Significant  For: Past Medical History:  Diagnosis Date  . Acid reflux   . Anxiety   . Chronic kidney disease    Stage III  . Depression   . Depression   . GERD (gastroesophageal reflux disease)   . Hyperlipidemia   . Morbid obesity ([place])   . Sleep apnea   . Thyroid disease     His Past Surgical History Is Significant For: Past Surgical History:  Procedure Laterality Date  . COLONOSCOPY WITH ESOPHAGOGASTRODUODENOSCOPY (EGD)  2010   Dr. Gala Romney: ulcerative/erosive reflux esophagitis, noncritical schatki's ring, multiple bulbar erosions with normal sb biopsies, colonic diverticulosis, normal TI  .  COLONOSCOPY WITH PROPOFOL N/A 10/30/2019   Procedure: COLONOSCOPY WITH PROPOFOL;  Surgeon: Daneil Dolin, MD;  [facility];  Service: Endoscopy;  Laterality: N/A;  1:30pm  . POLYPECTOMY  10/30/2019   Procedure: POLYPECTOMY;  Surgeon: Daneil Dolin, MD;  Location: [facility];  Service: Endoscopy;;  . TONSILLECTOMY    . Upper and Lower GI      His Family History Is Significant For: Family History  Problem Relation Age of Onset  . Alcohol abuse Father   . Colon cancer Father   . COPD Father   . Depression Daughter        acute  . Sexual abuse Daughter   . Lung disease Mother   . COPD Mother   . Thyroid disease Mother   . Alcohol abuse Paternal Uncle   . Colon cancer Daughter 52  . ADD / ADHD Neg Hx   . Anxiety disorder Neg Hx   . OCD Neg Hx   . Drug abuse Neg Hx   . Bipolar disorder Neg Hx   . Dementia Neg Hx   . Paranoid behavior Neg Hx   . Schizophrenia Neg Hx   . Seizures Neg Hx   . Physical abuse Neg Hx     His Social History Is Significant For: Social History   Socioeconomic History  . Marital status: Married    Spouse name: Not on file  . Number of children: Not on file  . Years of education: Not on file  . Highest education level: Not on file  Occupational History  . Not on file  Tobacco Use  . Smoking status: Never Smoker  . Smokeless tobacco: Never Used  Substance and Sexual Activity  . Alcohol use: Yes    Comment: occ  . Drug use: No  . Sexual activity: Yes    Birth control/protection: Surgical  Other Topics Concern  . Not on file  Social History Narrative  . Not on file   Social Determinants of Health   Financial Resource Strain: Not on file  Food Insecurity: Not on file  Transportation Needs: Not on file  Physical Activity: Not on file  Stress: Not on file  Social Connections: Not on file    His Allergies Are:  No Known Allergies:   His Current Medications Are:  Outpatient Encounter Medications as of 12/01/2020   Medication Sig  . ALPRAZolam (XANAX) 1 MG tablet Take 1 tablet (1 mg total) by mouth 4 (four) times daily.  . benztropine (COGENTIN) 0.5 MG tablet Take 1 tablet (0.5 mg total) by mouth 2 (two) times daily.  . DULoxetine (CYMBALTA) 60 MG capsule Take 1 capsule (60 mg total) by mouth daily.  Marland Kitchen gabapentin (NEURONTIN) 300 MG capsule Take 300 mg by mouth 3 (three) times daily as needed (pain).  Marland Kitchen levothyroxine (SYNTHROID) 175 MCG tablet Take  175 mcg by mouth daily before breakfast.   . lurasidone (LATUDA) 20 MG TABS tablet Take 1 tablet (20 mg total) by mouth daily.  . Multiple Vitamin (MULTIVITAMIN WITH MINERALS) TABS tablet Take 1 tablet by mouth daily.  . pantoprazole (PROTONIX) 40 MG tablet Take 40 mg by mouth daily.  . potassium chloride (KLOR-CON) 10 MEQ tablet Take 10 mEq by mouth every other day.  . traZODone (DESYREL) 100 MG tablet TAKE 1 TABLET (100 MG TOTAL) BY MOUTH AT BEDTIME.  . [DISCONTINUED] acidophilus (RISAQUAD) CAPS capsule Take 1 capsule by mouth daily.  . [DISCONTINUED] polyethylene glycol-electrolytes (NULYTELY/GOLYTELY) 420 g solution As directed   No facility-administered encounter medications on file as of 12/01/2020.  :   Review of Systems:  Out of a complete 14 point review of systems, all are reviewed and negative with the exception of these symptoms as listed below:   Review of Systems  Neurological:       Here for consult on worsening memory.     Objective:  Neurological Exam  Physical Exam Physical Examination:   Vitals:   12/01/20 1412  BP: 134/86  Pulse: 63  SpO2: 99%    General Examination: The patient is a very pleasant 60 y.o. male in no acute distress. He appears well-developed and well-nourished and well groomed.   HEENT: Normocephalic, atraumatic, pupils are equal, round and reactive to light, he has mild bilateral cataracts, extraocular tracking is well-preserved, face is symmetric, mild facial masking noted, neck is supple with full range  of motion.  Hearing is grossly intact to perhaps slightly impaired.  Speech is scant, he is minimally verbal.  He answers in 1-2 word sentences at times.  No dysarthria noted, no voice tremor.  Airway examination reveals a small airway, tongue protrudes centrally, palate elevates symmetrically, no carotid bruits.    Chest: Clear to auscultation without wheezing, rhonchi or crackles noted.  Heart: S1+S2+0, regular and normal without murmurs, rubs or gallops noted.   Abdomen: Soft, non-tender and non-distended with normal bowel sounds appreciated on auscultation.  Extremities: There is no trace edema in the distal lower extremities bilaterally. Pedal pulses are intact.  Skin: Warm and dry without trophic changes noted..  Musculoskeletal: exam reveals no obvious joint deformities, tenderness or joint swelling or erythema.   Neurologically:  Mental status: The patient is awake, alert and oriented in all 4 spheres. His immediate and remote memory, attention, language skills and fund of knowledge are mildly impaired.  He is not providing any details of his history himself.  His history is primarily provided by his wife.    On 12/01/2020: MMSE: 24/30, CDT: 4/4, AFT: 8/min.  Speech: He is soft-spoken and minimally verbal, answers in few word sentences mostly.  Mood is constricted in affect appears flat.   Cranial nerves II - XII are as described above under HEENT exam. In addition: shoulder shrug is normal with equal shoulder height noted. Motor exam: Normal bulk, strength and tone is noted. There is no drift, tremor or rebound. Romberg is not tested for safety concerns, reflexes are 1+ in the upper extremities and trace in the lower extremities, fine motor skills are mildly slow throughout, no resting tremor noted with the exception of a slight right upper extremity resting tremor noted intermittently.  He has a slight upper extremity tremor in both hands with posture and action.   Cerebellar testing:  No dysmetria or intention tremor on finger to nose testing. Heel to shin is unremarkable bilaterally. There is  no truncal or gait ataxia.  Sensory exam: intact to light touch in the upper and lower extremities.  Gait, station and balance: He stands without difficulty but stands up slowly, posture is perhaps mildly stooped for age, he walks slowly, no shuffling noted, no significant decrease in arm swing noted.   Assessment and plan:   In summary, KAIUS DAINO is a very pleasant 60 y.o.-year old male with an underlying medical history of thyroid disease, morbid obesity with a BMI of over 40, sleep apnea (suspected?), hyperlipidemia, reflux disease, depression, anxiety, and chronic kidney disease, who presents for evaluation of his memory loss of several years duration.  He had a brain MRI in 2014 for a 1 year history of memory loss and findings were not telltale for any significant atrophy at the time.  He has had difficulty with short-term memory and long-term memory even and also lapses in memory or confusion as well as disorientation.  He has had some issues driving even.  His wife has noted memory issues for the past 8 or 9 years, she feels that his memory issues were noticeable after they lost their daughter in 2010.  He has struggled with major depression for quite a few years.  He has had some medication changes.  His history certainly not telltale for Alzheimer's dementia, he does have some vascular risk factors.  As I understand, he has not been formally diagnosed with sleep apnea but his wife has noted snoring and pauses in his breathing while he is asleep.  He is scheduled for neuropsychological evaluation through Dr. Ferne Coe office next week.  I recommend we pursue additional testing through our office in the form of brain MRI without contrast and a sleep study.  If he has underlying obstructive sleep apnea, I would recommend treatment in the form of CPAP or AutoPap.  He had recent blood work  through your office which I was able to review.  If it has not been checked, I would also recommend checking his vitamin B12 level at the next blood test.  We will proceed with a brain scan and sleep testing and keep him posted as to the test results by phone call and plan to follow-up afterwards.  We will also be on the look out for results from the neuropsychological evaluation in the near future.  We talked about the importance of healthy lifestyle, good nutrition, good hydration with water, getting appropriate rest.  I did not suggest any new medications from my end of things at this time.  We can certainly consider medications for dementia if the need arises, after additional testing. I answered all their questions today and the patient and his wife are in agreement with the plan.  Thank you very much for allowing me to participate in the care of this nice patient. If I can be of any further assistance to you please do not hesitate to call me at [phone].  Sincerely,   Star Age, MD, PhD

## 2020-12-02 ENCOUNTER — Telehealth: Payer: Self-pay

## 2020-12-02 ENCOUNTER — Telehealth: Payer: Self-pay | Admitting: Neurology

## 2020-12-02 NOTE — Telephone Encounter (Signed)
LVM for pt to call me back to schedule sleep study  

## 2020-12-02 NOTE — Telephone Encounter (Signed)
[employer] pending  

## 2020-12-02 NOTE — Telephone Encounter (Signed)
[employer] Josem Kaufmann: [id number]. 12/02/20 to 01/01/21) order sent to GI. They will reach out to the patient to schedule.

## 2020-12-06 ENCOUNTER — Encounter: Payer: Medicare HMO | Attending: Psychology | Admitting: Psychology

## 2020-12-06 ENCOUNTER — Encounter: Payer: Self-pay | Admitting: Psychology

## 2020-12-06 ENCOUNTER — Other Ambulatory Visit: Payer: Self-pay

## 2020-12-06 DIAGNOSIS — F4312 Post-traumatic stress disorder, chronic: Secondary | ICD-10-CM

## 2020-12-06 DIAGNOSIS — F5105 Insomnia due to other mental disorder: Secondary | ICD-10-CM | POA: Diagnosis not present

## 2020-12-06 DIAGNOSIS — F332 Major depressive disorder, recurrent severe without psychotic features: Secondary | ICD-10-CM | POA: Diagnosis not present

## 2020-12-06 DIAGNOSIS — R413 Other amnesia: Secondary | ICD-10-CM | POA: Diagnosis not present

## 2020-12-06 DIAGNOSIS — I10 Essential (primary) hypertension: Secondary | ICD-10-CM | POA: Diagnosis not present

## 2020-12-06 DIAGNOSIS — E782 Mixed hyperlipidemia: Secondary | ICD-10-CM | POA: Diagnosis not present

## 2020-12-06 DIAGNOSIS — F3181 Bipolar II disorder: Secondary | ICD-10-CM | POA: Diagnosis not present

## 2020-12-06 DIAGNOSIS — E785 Hyperlipidemia, unspecified: Secondary | ICD-10-CM | POA: Diagnosis not present

## 2020-12-06 DIAGNOSIS — F411 Generalized anxiety disorder: Secondary | ICD-10-CM | POA: Diagnosis not present

## 2020-12-06 DIAGNOSIS — F319 Bipolar disorder, unspecified: Secondary | ICD-10-CM

## 2020-12-06 DIAGNOSIS — F419 Anxiety disorder, unspecified: Secondary | ICD-10-CM | POA: Diagnosis not present

## 2020-12-06 DIAGNOSIS — N529 Male erectile dysfunction, unspecified: Secondary | ICD-10-CM | POA: Diagnosis not present

## 2020-12-06 NOTE — Progress Notes (Signed)
   Neuropsychology Note  [name] completed 240 minutes of neuropsychological testing with this provider Health and safety inspector). The patient did not appear overtly distressed by the testing session, per behavioral observation or via self-report. Rest breaks were offered.   Tests Administered:    Wechsler Adult Intelligence Scale, 4th Edition (WAIS-IV)  Wechsler Memory Scale, 4th Edition (WMS-IV) Adult Battery  Results:  To be included once scored.  Feedback with patient:  [name] will return on 01/31/21 for an interactive feedback session with Dr. Sima Matas at which time his test performances, clinical impressions and treatment recommendations will be reviewed in detail. The patient understands he can contact our office should he require our assistance before this time.  Full report to follow.

## 2020-12-07 ENCOUNTER — Encounter: Payer: Medicare HMO | Attending: Psychology | Admitting: Psychology

## 2020-12-07 ENCOUNTER — Telehealth: Payer: Self-pay

## 2020-12-07 DIAGNOSIS — F319 Bipolar disorder, unspecified: Secondary | ICD-10-CM | POA: Diagnosis not present

## 2020-12-07 DIAGNOSIS — F4312 Post-traumatic stress disorder, chronic: Secondary | ICD-10-CM | POA: Diagnosis not present

## 2020-12-07 DIAGNOSIS — F5105 Insomnia due to other mental disorder: Secondary | ICD-10-CM | POA: Diagnosis not present

## 2020-12-07 DIAGNOSIS — F332 Major depressive disorder, recurrent severe without psychotic features: Secondary | ICD-10-CM | POA: Diagnosis not present

## 2020-12-07 DIAGNOSIS — R413 Other amnesia: Secondary | ICD-10-CM | POA: Insufficient documentation

## 2020-12-07 NOTE — Telephone Encounter (Signed)
LVM for pt to call me back to schedule sleep study  

## 2020-12-14 ENCOUNTER — Ambulatory Visit
Admission: RE | Admit: 2020-12-14 | Discharge: 2020-12-14 | Disposition: A | Discharge status: Home / Self Care | Payer: Medicare HMO | Source: Ambulatory Visit | Attending: Neurology | Admitting: Neurology

## 2020-12-14 DIAGNOSIS — G4719 Other hypersomnia: Secondary | ICD-10-CM

## 2020-12-14 DIAGNOSIS — R0683 Snoring: Secondary | ICD-10-CM

## 2020-12-14 DIAGNOSIS — R413 Other amnesia: Secondary | ICD-10-CM

## 2020-12-14 DIAGNOSIS — R0681 Apnea, not elsewhere classified: Secondary | ICD-10-CM

## 2020-12-14 DIAGNOSIS — F39 Unspecified mood [affective] disorder: Secondary | ICD-10-CM

## 2020-12-14 DIAGNOSIS — R410 Disorientation, unspecified: Secondary | ICD-10-CM

## 2020-12-14 DIAGNOSIS — R634 Abnormal weight loss: Secondary | ICD-10-CM

## 2020-12-15 ENCOUNTER — Telehealth: Payer: Self-pay

## 2020-12-15 NOTE — Telephone Encounter (Signed)
-----   Message from Star Age, MD sent at 12/15/2020  4:46 PM EST ----- Please call patient and advise him that his brain MRI without contrast showed no significant abnormality, no acute findings.  I recommend we see him in follow-up after he has had his evaluation through Dr. Sima Matas.

## 2020-12-15 NOTE — Progress Notes (Signed)
Please call patient and advise him that his brain MRI without contrast showed no significant abnormality, no acute findings.  I recommend we see him in follow-up after he has had his evaluation through Dr. Sima Matas.

## 2020-12-15 NOTE — Telephone Encounter (Signed)
Pt and pts wife advised of results and has scheduled f/u for May with Dr. Rexene Alberts

## 2020-12-22 ENCOUNTER — Ambulatory Visit (INDEPENDENT_AMBULATORY_CARE_PROVIDER_SITE_OTHER): Payer: Medicare HMO | Admitting: Neurology

## 2020-12-22 DIAGNOSIS — R413 Other amnesia: Secondary | ICD-10-CM

## 2020-12-22 DIAGNOSIS — R634 Abnormal weight loss: Secondary | ICD-10-CM

## 2020-12-22 DIAGNOSIS — G4733 Obstructive sleep apnea (adult) (pediatric): Secondary | ICD-10-CM | POA: Diagnosis not present

## 2020-12-22 DIAGNOSIS — G4719 Other hypersomnia: Secondary | ICD-10-CM

## 2020-12-22 DIAGNOSIS — F39 Unspecified mood [affective] disorder: Secondary | ICD-10-CM

## 2020-12-22 DIAGNOSIS — R410 Disorientation, unspecified: Secondary | ICD-10-CM

## 2020-12-22 DIAGNOSIS — R0683 Snoring: Secondary | ICD-10-CM

## 2020-12-22 DIAGNOSIS — R0681 Apnea, not elsewhere classified: Secondary | ICD-10-CM

## 2020-12-29 NOTE — Progress Notes (Signed)
Patient referred by Dr. Nevada Crane for memory loss, seen by me on 12/01/20, HST on 12/22/20.    Please call and notify the patient that the recent home sleep test showed obstructive sleep apnea. OSA is overall mild, but he did have some desaturations into the 80s and even 70s. I recommend treatment in the form of autoPAP, which means, that we don't have to bring him in for a sleep study with CPAP, but will let him try an autoPAP machine at home, through a DME company (of his choice, or as per insurance requirement). The DME representative will educate him on how to use the machine, how to put the mask on, etc. I have placed an order in the chart. Please send referral, talk to patient, send report to referring MD. We will need a FU in sleep clinic for 10 weeks post-PAP set up, please arrange that with me or one of our NPs. Thanks,   Greg Age, MD, PhD [facility])

## 2020-12-29 NOTE — Procedures (Signed)
   [facility] TEST (Watch PAT)  STUDY DATE: 12-22-20  DOB: 25-Feb-1961  MRN: [id number]  ORDERING CLINICIAN: Star Age, MD, PhD   REFERRING CLINICIAN: Celene Squibb, MD   CLINICAL INFORMATION/HISTORY: 60 year old man with a history of thyroid disease, morbid obesity with a BMI of over 40, hyperlipidemia, reflux disease, depression, anxiety, and chronic kidney disease, who reports worsening memory. He had chronic sleep difficulty.   Epworth sleepiness score: 4/24.  BMI: 40.6 kg/m  Neck Circumference: N/A  FINDINGS:   Total Record Time (hours, min): 11 H 17 min  Total Sleep Time (hours, min):  10 H 26 min  Percent REM (%):    7.18 "   Calculated pAHI (per hour): 5.8       REM pAHI: 10.8    NREM pAHI: 5.5 Supine AHI: N/A   Oxygen Saturation (%) Mean: 97  Minimum oxygen saturation (%):        73   O2 Saturation Range (%): 73-100  O2Saturation (minutes) <=88%: 0.3 min   Pulse Mean (bpm):    58  Pulse Range (35-111)   IMPRESSION: OSA (obstructive sleep apnea)  RECOMMENDATION:  This home sleep test demonstrates overall mild obstructive sleep apnea - by number of events - but some desaturations into the 80s and 70s, with a total AHI of 5.8/hour and O2 nadir of 73%. Given the patient's medical history and sleep related complaints, treatment with positive airway pressure is recommended. This can be achieved in the form of autoPAP trial/titration at home. A full night CPAP titration study will help with proper treatment settings and mask fitting if needed. Alternative treatments include weight loss along with avoidance of the supine sleep position, or an oral appliance in appropriate candidates.   Please note that untreated obstructive sleep apnea may carry additional perioperative morbidity. Patients with significant obstructive sleep apnea should receive perioperative PAP therapy and the surgeons and particularly the anesthesiologist should be informed of the  diagnosis and the severity of the sleep disordered breathing. The patient should be cautioned not to drive, work at heights, or operate dangerous or heavy equipment when tired or sleepy. Review and reiteration of good sleep hygiene measures should be pursued with any patient. Other causes of the patient's symptoms, including circadian rhythm disturbances, an underlying mood disorder, medication effect and/or an underlying medical problem cannot be ruled out based on this test. Clinical correlation is recommended.   The patient and his referring provider will be notified of the test results. The patient will be seen in follow up in sleep clinic at [facility].  I certify that I have reviewed the raw data recording prior to the issuance of this report in accordance with the standards of the American Academy of Sleep Medicine (AASM).  INTERPRETING PHYSICIAN:  Star Age, MD, PhD  Board Certified in Neurology and Sleep Medicine [facility] [address] [place], [place] [address] [phone]

## 2020-12-29 NOTE — Addendum Note (Signed)
Addended by: Star Age on: 12/29/2020 05:35 PM   Modules accepted: Orders

## 2020-12-30 ENCOUNTER — Telehealth: Payer: Self-pay

## 2020-12-30 NOTE — Telephone Encounter (Signed)
-----   Message from Star Age, MD sent at 12/29/2020  5:34 PM EDT ----- Patient referred by Dr. Nevada Crane for memory loss, seen by me on 12/01/20, HST on 12/22/20.    Please call and notify the patient that the recent home sleep test showed obstructive sleep apnea. OSA is overall mild, but he did have some desaturations into the 80s and even 70s. I recommend treatment in the form of autoPAP, which means, that we don't have to bring him in for a sleep study with CPAP, but will let him try an autoPAP machine at home, through a DME company (of his choice, or as per insurance requirement). The DME representative will educate him on how to use the machine, how to put the mask on, etc. I have placed an order in the chart. Please send referral, talk to patient, send report to referring MD. We will need a FU in sleep clinic for 10 weeks post-PAP set up, please arrange that with me or one of our NPs. Thanks,   Star Age, MD, PhD [facility])

## 2020-12-30 NOTE — Telephone Encounter (Signed)
I called pt and spoke with the wife ( ok per dpr). I advised pt that Dr. Rexene Alberts reviewed their sleep study results and found that pt mild OSA with severe O2 desats.. Dr. Rexene Alberts recommends that pt start auto-pap for treatement. I reviewed PAP compliance expectations with the pt. Pt is agreeable to starting an auto-PAP. I advised pt that an order will be sent to a DME, [facility], and [facility] will call the pt within about one week after they file with the pt's insurance. [facility] will show the pt how to use the machine, fit for masks, and troubleshoot the auto-PAP if needed. I advised of the national shortage of PAP machines and that start dates can be 6-12 weeks out. I advise we would nee to see the pt back 31-90 days after starting machine and to call back once start date is completed. A letter with all of this information in it will be mailed to the pt as a reminder. I verified with the pt that the address we have on file is correct. Pt verbalized understanding of results. Pt had no questions at this time but was encouraged to call back if questions arise. I have sent the order to [facility] and have received confirmation that they have received the order.

## 2021-01-05 DIAGNOSIS — F3181 Bipolar II disorder: Secondary | ICD-10-CM | POA: Diagnosis not present

## 2021-01-05 DIAGNOSIS — E1165 Type 2 diabetes mellitus with hyperglycemia: Secondary | ICD-10-CM | POA: Diagnosis not present

## 2021-01-05 DIAGNOSIS — F411 Generalized anxiety disorder: Secondary | ICD-10-CM | POA: Diagnosis not present

## 2021-01-05 DIAGNOSIS — I10 Essential (primary) hypertension: Secondary | ICD-10-CM | POA: Diagnosis not present

## 2021-01-07 ENCOUNTER — Telehealth (HOSPITAL_COMMUNITY): Payer: Self-pay | Admitting: Psychiatry

## 2021-01-07 NOTE — Telephone Encounter (Signed)
Called to re-schedule appt, left vm

## 2021-01-12 ENCOUNTER — Telehealth (HOSPITAL_COMMUNITY): Payer: Medicare HMO | Admitting: Psychiatry

## 2021-01-14 ENCOUNTER — Telehealth (INDEPENDENT_AMBULATORY_CARE_PROVIDER_SITE_OTHER): Payer: Medicare HMO | Admitting: Psychiatry

## 2021-01-14 ENCOUNTER — Encounter (HOSPITAL_COMMUNITY): Payer: Self-pay | Admitting: Psychiatry

## 2021-01-14 ENCOUNTER — Other Ambulatory Visit: Payer: Self-pay

## 2021-01-14 DIAGNOSIS — F419 Anxiety disorder, unspecified: Secondary | ICD-10-CM

## 2021-01-14 DIAGNOSIS — F332 Major depressive disorder, recurrent severe without psychotic features: Secondary | ICD-10-CM

## 2021-01-14 MED ORDER — DULOXETINE HCL 60 MG PO CPEP: 60.0000 mg | ORAL_CAPSULE | Freq: Every day | ORAL | 2 refills | Status: DC

## 2021-01-14 MED ORDER — ALPRAZOLAM 1 MG PO TABS: 1.0000 mg | ORAL_TABLET | Freq: Four times a day (QID) | ORAL | 2 refills | Status: DC

## 2021-01-14 MED ORDER — TRAZODONE HCL 100 MG PO TABS: ORAL_TABLET | ORAL | 2 refills | Status: DC

## 2021-01-14 NOTE — Progress Notes (Signed)
Virtual Visit via Video Note  I connected with [name] on 01/14/21 at  9:40 AM EDT by a video enabled telemedicine application and verified that I am speaking with the correct person using two identifiers.  Location: Patient: home Provider: home   I discussed the limitations of evaluation and management by telemedicine and the availability of in person appointments. The patient expressed understanding and agreed to proceed.    I discussed the assessment and treatment plan with the patient. The patient was provided an opportunity to ask questions and all were answered. The patient agreed with the plan and demonstrated an understanding of the instructions.   The patient was advised to call back or seek an in-person evaluation if the symptoms worsen or if the condition fails to improve as anticipated.  I provided 15 minutes of non-face-to-face time during this encounter.   [name], MD  [facility] MD/PA/NP OP Progress Note  01/14/2021 10:15 AM [name]  MRN:  [id number]  Chief Complaint:  Chief Complaint    Depression; Anxiety; Follow-up     HPI: This patient is a 60 year old white male who lives with his wife in [place].  He is on disability.  The patient returns for follow-up after 3 months regarding his posttraumatic stress disorder and anxiety.  He has been somewhat stressed lately.  One of his daughters moved closer to them with her husband and 6 children.  They have been spending every Sunday with them but the son-in-law now decided he did not want them there as much.  The wife describes him as "narcissistic."  Initially the patient was having a hard time with this but he is excepted it and realizes that he does not want to put his daughter in the middle.  They still see each other at church.  The patient states that his mood has been generally good.  He is enjoying things in his life.  He was seen by neurology and had a sleep study and does have some oxygen  desaturations at night.  He is waiting to get a specialist type of CPAP machine.  He also has neuropsychological testing results pending.  In the interim we have been weaning down the [place] and he has been doing fine.  He feels ready to stop it and therefore we can also stop the benztropine.  He feels like his other medicines have been helpful for mood. Visit Diagnosis:    ICD-10-CM   1. Major depressive disorder, recurrent, severe without psychotic features ([place])  F33.2   2. Anxiety  F41.9     Past Psychiatric History: Several past hospitalizations but none since 2012  Past Medical History:  Past Medical History:  Diagnosis Date  . Acid reflux   . Anxiety   . Chronic kidney disease    Stage III  . Depression   . Depression   . GERD (gastroesophageal reflux disease)   . Hyperlipidemia   . Morbid obesity ([place])   . Sleep apnea   . Thyroid disease     Past Surgical History:  Procedure Laterality Date  . COLONOSCOPY WITH ESOPHAGOGASTRODUODENOSCOPY (EGD)  2010   Dr. Gala Romney: ulcerative/erosive reflux esophagitis, noncritical schatki's ring, multiple bulbar erosions with normal sb biopsies, colonic diverticulosis, normal TI  . COLONOSCOPY WITH PROPOFOL N/A 10/30/2019   Procedure: COLONOSCOPY WITH PROPOFOL;  Surgeon: Daneil Dolin, MD;  [facility]: [facility];  Service: Endoscopy;  Laterality: N/A;  1:30pm  . POLYPECTOMY  10/30/2019   Procedure: POLYPECTOMY;  Surgeon: Daneil Dolin, MD;  Location: [facility];  Service: Endoscopy;;  . TONSILLECTOMY    . Upper and Lower GI      Family Psychiatric History: see below  Family History:  Family History  Problem Relation Age of Onset  . Alcohol abuse Father   . Colon cancer Father   . COPD Father   . Depression Daughter        acute  . Sexual abuse Daughter   . Lung disease Mother   . COPD Mother   . Thyroid disease Mother   . Alcohol abuse Paternal Uncle   . Colon cancer Daughter 74  . ADD / ADHD Neg Hx   . Anxiety  disorder Neg Hx   . OCD Neg Hx   . Drug abuse Neg Hx   . Bipolar disorder Neg Hx   . Dementia Neg Hx   . Paranoid behavior Neg Hx   . Schizophrenia Neg Hx   . Seizures Neg Hx   . Physical abuse Neg Hx     Social History:  Social History   Socioeconomic History  . Marital status: Married    Spouse name: Not on file  . Number of children: Not on file  . Years of education: Not on file  . Highest education level: Not on file  Occupational History  . Not on file  Tobacco Use  . Smoking status: Never Smoker  . Smokeless tobacco: Never Used  Substance and Sexual Activity  . Alcohol use: Yes    Comment: occ  . Drug use: No  . Sexual activity: Yes    Birth control/protection: Surgical  Other Topics Concern  . Not on file  Social History Narrative  . Not on file   Social Determinants of Health   Financial Resource Strain: Not on file  Food Insecurity: Not on file  Transportation Needs: Not on file  Physical Activity: Not on file  Stress: Not on file  Social Connections: Not on file    Allergies: No Known Allergies  Metabolic Disorder Labs: No results found for: HGBA1C, MPG No results found for: PROLACTIN Lab Results  Component Value Date   CHOL  06/16/2010    166        ATP III CLASSIFICATION:  <200     mg/dL   Desirable  200-239  mg/dL   Borderline High  >=240    mg/dL   High          TRIG 166 (H) 06/16/2010   HDL 29 (L) 06/16/2010   CHOLHDL 5.7 06/16/2010   VLDL 33 06/16/2010   LDLCALC (H) 06/16/2010    104        Total Cholesterol/HDL:CHD Risk Coronary Heart Disease Risk Table                     Men   Women  1/2 Average Risk   3.4   3.3  Average Risk       5.0   4.4  2 X Average Risk   9.6   7.1  3 X Average Risk  23.4   11.0        Use the calculated Patient Ratio above and the CHD Risk Table to determine the patient's CHD Risk.        ATP III CLASSIFICATION (LDL):  <100     mg/dL   Optimal  100-129  mg/dL   Near or Above  Optimal  130-159  mg/dL   Borderline  160-189  mg/dL   High  >190     mg/dL   Very High   Lab Results  Component Value Date   TSH 11.645 (H) 12/14/2010   TSH 0.264 (L) 06/16/2010    Therapeutic Level Labs: No results found for: LITHIUM No results found for: VALPROATE No components found for:  CBMZ  Current Medications: Current Outpatient Medications  Medication Sig Dispense Refill  . ALPRAZolam (XANAX) 1 MG tablet Take 1 tablet (1 mg total) by mouth 4 (four) times daily. 120 tablet 2  . benztropine (COGENTIN) 0.5 MG tablet Take 1 tablet (0.5 mg total) by mouth 2 (two) times daily. 90 tablet 2  . DULoxetine (CYMBALTA) 60 MG capsule Take 1 capsule (60 mg total) by mouth daily. 90 capsule 2  . gabapentin (NEURONTIN) 300 MG capsule Take 300 mg by mouth 3 (three) times daily as needed (pain).    Marland Kitchen levothyroxine (SYNTHROID) 175 MCG tablet Take 175 mcg by mouth daily before breakfast.     . lurasidone (LATUDA) 20 MG TABS tablet Take 1 tablet (20 mg total) by mouth daily. 30 tablet 2  . Multiple Vitamin (MULTIVITAMIN WITH MINERALS) TABS tablet Take 1 tablet by mouth daily.    . pantoprazole (PROTONIX) 40 MG tablet Take 40 mg by mouth daily.    . potassium chloride (KLOR-CON) 10 MEQ tablet Take 10 mEq by mouth every other day.    . traZODone (DESYREL) 100 MG tablet TAKE 1 TABLET (100 MG TOTAL) BY MOUTH AT BEDTIME. 90 tablet 2   No current facility-administered medications for this visit.     Musculoskeletal: Strength & Muscle Tone: within normal limits Gait & Station: normal Patient leans: N/A  Psychiatric Specialty Exam: Review of Systems  All other systems reviewed and are negative.   There were no vitals taken for this visit.There is no height or weight on file to calculate BMI.  General Appearance: Casual and Fairly Groomed  Eye Contact:  Good  Speech:  Clear and Coherent  Volume:  Normal  Mood:  Euthymic  Affect:  Appropriate and Congruent  Thought Process:  Goal  Directed  Orientation:  Full (Time, Place, and Person)  Thought Content: Rumination   Suicidal Thoughts:  No  Homicidal Thoughts:  No  Memory: Complains of short-term memory loss, neuropsychological results are pending  Judgement:  Good  Insight:  Good  Psychomotor Activity:  Decreased  Concentration:  Concentration: Fair and Attention Span: Fair  Recall:  [employer] of Knowledge: Fair  Language: Good  Akathisia:  No  Handed:  Right  AIMS (if indicated): not done  Assets:  Communication Skills Desire for Improvement Physical Health Resilience Social Support Talents/Skills  ADL's:  Intact  Cognition: WNL  Sleep:  Good   Screenings: Mini-Mental   [place] Office Visit from 12/01/2020 in [facility]  Total Score (max 30 points ) 24    PHQ2-9   Flowsheet Row Video Visit from 01/14/2021 in [facility]  PHQ-2 Total Score 0    Flowsheet Row Video Visit from 01/14/2021 in [facility] No Risk       Assessment and Plan: This patient is a 60 year old male with a history of posttraumatic stress disorder depression and anxiety.  We are slowly getting him off Latuda and he can now stop it as well as the benztropine.  He will continue Cymbalta 60 mg daily  for depression trazodone 100 mg at bedtime for sleep and Xanax 1 mg up to 4 times daily for anxiety.  He states he usually does not take this much but once in a while he has to.  He will return to see me in 3 months   [name], MD 01/14/2021, 10:15 AM

## 2021-01-17 ENCOUNTER — Encounter: Payer: Self-pay | Admitting: Psychology

## 2021-01-17 NOTE — Progress Notes (Signed)
Neuropsychological Evaluation   Patient:  Greg Kemp   DOB: 05-Oct-1961  MR Number: [id number]  Location: [facility] MEDIC[facility] PHYSICAL MEDICINE AND REHABILITATION [place], [place] [id number] [place] [id number] Dept: [phone]  Start: 8 AM End: 9 AM  Provider/Observer:     Edgardo Roys PsyD  Chief Complaint:      Chief Complaint  Patient presents with  . Memory Loss  . Depression  . Post-Traumatic Stress Disorder    Reason For Service:     Greg Kemp is a 60 year old male who was referred for neuropsychological evaluation by his treating psychiatrist [name], MD.  Dr. Harrington Challenger has followed the patient for some time for issues related to major depressive disorder recurrent severe without psychotic features, anxiety the patient has a significant past history of severe chronic posttraumatic stress disorder.  I also saw the patient in the [facility] behavioral [facility] outpatient program between 2014 and 2018 for severe major depressive episodes and chronic posttraumatic stress disorder therapeutically.  The patient is continuing to describe significant memory difficulties and Dr. Harrington Challenger felt it would be pertinent to get formal assessment with regard to his memory deficits.  Patient has a past medical history including previous diagnosis of bipolar 1 disorder versus major depressive disorder with chronic PTSD.  He also has episodes of obesity, significant insomnia, anxiety and pain symptoms.  Hypothyroidism, bronchitis, GERD, irritable bowel syndrome, and chest pain are all noted issues with chest pain likely due to acid reflux.  During the clinical interview, the patient reports that he has continued to see Dr. Harrington Challenger and feels like he is being well managed with regard to his depression and there have been recent helpful changes in medication strategies.  The patient reports he continues to have significant memory  difficulties, which he noted back when I saw him between 2014 and 2018.  He reports that there have not been significant changes of note.  The patient reports that he feels like he just cannot "remember anything."  The patient reports that he has difficulty remembering various family members life history and remembering aspects of friends and events in his children's life.  The patient reports that the symptoms of been like that since the beginning of his difficulties but feels like they may be getting somewhat worse.  The patient reports he repeatedly forgets things like what he is talking about in conversation and important life events.  He recently attempted to change the oil in his truck and forgot how he should go about doing that even though he has done this procedure many times in the past.  The patient reports that he will start reading things such as books and cannot remember earlier chapters in the books.  The patient reports that he forgets people's names and has significant attention and short-term memory difficulties present.  The patient denies any tremor, visual or auditory hallucinations or significant changes in gait.  The patient reports that he has had previous concussions.  The patient reports that he suffered head traumas at various times growing up.  The patient reports that he once fell from a loft while climbing a ladder into the loft falling in the loft coming down.  The patient reports that he did see a doctor around this time but no MRIs or other imaging was done.  The patient reports that he had seizure as a child and took phenobarbital at 1 point.  The patient  reports that he gets between 6 and 8 hours of sleep with occasional bouts of insomnia.  He describes his appetite is good.  The patient has concerns about his difficulties as he "feels like he is embarrassing his family with his difficulty and he feels like he has been getting more more information.  Note added 12/15/2020:  Had an MRI conducted on 12/14/2020 with the impression noted by Dr. Leta Baptist as being an unremarkable MRI of the brain without contrast.  There were no acute findings and there was also no indication of significant small vessel disease or microvascular disease.  Note added 12/22/2020: Patient recently completed a home sleep test evaluation through Star Age, MD, PHD with [facility].  This sleep study showed obstructive sleep apnea.  While obstructive sleep apnea was overall mild he did have significant desaturations into the 80s and even down into the 70s.  The neurologist recommended treatment in the form of AutoPap which would avoid him having to have a more in-depth admission for formal sleep study with CPAP.  Neuropsychology Note  [name] completed 240 minutes of neuropsychological testing with this provider Health and safety inspector). The patient did not appear overtly distressed by the testing session, per behavioral observation or via self-report. Rest breaks were offered.   Tests Administered:    Wechsler Adult Intelligence Scale, 4th Edition (WAIS-IV)  Wechsler Memory Scale, 4th Edition (WMS-IV) Adult Battery   Test Results:   Initially, an effort was made to provide a prediction as to the patient's premorbid/historical intellectual and cognitive functioning utilizing a number of psychosocial features most prominently the patient's educational history and work history.  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  I would conservatively estimate the patient's historic/premorbid intellectual and cognitive abilities to be in the upper end of the average to high average range and we will utilize a conservative estimate around 110-115 as far as composite score estimates.  Composite Score Summary  Scale Sum of Scaled Scores Composite Score  Percentile Rank 95% Conf. Interval Qualitative Description  Verbal Comprehension 33 VCI 105 63 99-110 Average  Perceptual Reasoning 21 PRI 82 12 77-89 Low Average  Working Memory 10 WMI 71 3 66-80 Borderline  Processing Speed 10 PSI 74 4 68-85 Borderline  Full Scale 74 FSIQ 82 12 78-86 Low Average  General Ability 54 GAI 94 34 89-99 Average   Initially, the patient was administered the Wechsler Adult Intelligence Scale-IV to provide a standardized very well normed battery of neuropsychological measures that assess a broad range of intellectual and cognitive functioning.  As far as global composite scores, the patient produced a full-scale IQ score of 82 which fell at the 12th percentile and was in the low average range.  We also calculated the patient's general abilities index score which places less emphasis on items that are most sensitive to acute changes related to working memory/encoding and overall information processing speed.  With controlling these variables the patient produced a general abilities index score of 94 which places him at the 34th percentile and at the lower end of the average range.  Both of these measures are below predicted levels and suggest 1 or more cognitive areas are being negatively impacted.  In fact, the 2 areas that were the most problematic and impaired for the patient had to do with his auditory encoding/working memory abilities as well as information processing  speed.  This pattern strongly suggest acute changes.  Verbal Comprehension Subtests Summary  Subtest Raw Score Scaled Score Percentile Rank Reference Group Scaled Score SEM  Similarities 27 11 63 11 1.08  Vocabulary 37 10 50 10 0.73  Information 18 12 75 13 0.67   The patient produced a verbal comprehension index score of 105 which falls at the 63rd percentile and is in the average range.  This is much more consistent with predicted levels with individual subtest scores functioning in the average to high  average range related to verbal reasoning and problem solving, his general vocabulary knowledge as well as his general fund of information.   Perceptual Reasoning Subtests Summary  Subtest Raw Score Scaled Score Percentile Rank Reference Group Scaled Score SEM  Block Design 28 8 25 6  1.04  Matrix Reasoning 8 6 9 4  0.95  Visual Puzzles 9 7 16 6  0.99   The patient produced a perceptual reasoning index score of 82 which falls at the 12 percentile.  There were some impairments on individual subtest particularly related to visual reasoning and problem solving and visual judgment and estimation.  The patient also showed some difficulties with regard to visual analysis and organizational abilities.  However, all 3 of these subtest making up this composite score were timed in nature and the patient did have some difficulties particularly for timed events.   Working Doctor, general practice Raw Score Scaled Score Percentile Rank Reference Group Scaled Score SEM  Digit Span 16 5 5 4  0.85  Arithmetic 8 5 5 5  1.04   The patient produced a working memory index score of 71 which falls in the 3rd percentile and is in the borderline range of functioning relative to a normative sample.  These are significantly impaired scores and are well below levels that he would have been performing in the past to complete his educational and work related occupation.  The patient showed significant impairments with regard to auditory encoding and learning abilities as well as multi processing information.   Processing Speed Subtests Summary  Subtest Raw Score Scaled Score Percentile Rank Reference Group Scaled Score SEM  Symbol Search 16 5 5 4  1.31  Coding 34 5 5 4  0.99   The patient produced a processing speed index score of 74 which fell at the 4th percentile and was significantly impaired.  The patient showed significant reduction in information processing speed, visual scanning and visual searching abilities.   This is well below what would be predicted based on the patient's educational and occupational histories.   Index Score Summary  Index Sum of Scaled Scores Index Score Percentile Rank 95% Confidence Interval Qualitative Descriptor  Auditory Memory (AMI) 28 82 12 77-89 Low Average  Visual Memory (VMI) 24 76 5 71-83 Borderline  Visual Working Memory (VWMI) 10 70 2 65-79 Borderline  Immediate Memory (IMI) 24 73 4 68-81 Borderline  Delayed Memory (DMI) 28 80 9 74-88 Low Average    Symbol Span VWM 4 3 1    We then administered the Wechsler Memory Scale-IV to provide an in-depth analysis of the patient's memory and learning capacity, which has been his primary reported difficulties and impairments.  The patient showed significant impairments on measures of auditory encoding as assessed on the Wechsler Adult Intelligence Scale and the patient showed similar pattern with regard to visual encoding abilities as assessed on the Wechsler Memory Scale in the subtest symbol span where he also produced an index score of 4 which fell  at the 3rd percentile.  Both of these measures indicate significant impairments with regard to both auditory and visual encoding.  Breaking memory and learning down between auditory versus visual memory components the patient produced an auditory memory index score of 82 which fell at the 12 percentile and was in the low average range.  This is more than 20 points below predicted level and significantly below the patient's verbal comprehension abilities currently.  The patient showed similar deficits for visual memory worry he produced a visual memory index score of 76 which fell at the 5th percentile.  Breaking the patient's memory down between immediate versus delayed memory the patient produced an immediate memory index score of 73 which fell at the 4th percentile and was in the borderline range.  The patient showed a little improvement on delayed memory capacity where he produced  a delayed memory index score of 80 which fell at the 9th percentile and was in the low average range.  However, both of these are below predicted levels.  This pattern does suggest that the information that is initially stored is available for later recall.   Logical Memory  Score Score 1 Score 2 Contrast Scaled Score  LM II Recognition vs. Delayed Recall 17-25% 7 9  LM Immediate Recall vs. Delayed Recall 6 7 11    Verbal [facility]  Score Score 1 Score 2 Contrast Scaled Score  VPA II Recognition vs. Delayed Recall 17-25% 7 8  VPA Immediate Recall vs. Delayed Recall 8 7 8    The patient showed no significant improvement with regard to cueing or recognition formats versus free recall delayed recall formats.  This pattern suggest that his memory deficits are primarily related to deficits around encoding ability and initially learning information and if information is learned it is available for free recall later but the primary deficits have to do with encoding and storage of information rather than a simply retrieval deficit.  ABILITY-MEMORY ANALYSIS  Ability Score:  VCI: 105 Date of Testing:  WAIS-IV; WMS-IV 2020/12/06  Predicted Difference Method   Index Predicted WMS-IV Index Score Actual WMS-IV Index Score Difference Critical Value  Significant Difference Y/N Base Rate  Auditory Memory 103 82 21 9.00 Y 5-10%  Visual Memory 102 76 26 8.38 Y 3%  Visual Working Memory 103 70 33 10.86 Y <1%  Immediate Memory 103 73 30 10.12 Y <1%  Delayed Memory 103 80 23 9.95 Y 4%  Statistical significance (critical value) at the .01 level.    We also calculated the patient's ability-memory analysis.  Utilizing the verbal comprehension index score from the Weschler adult intelligence scale a predicted level of performance from that measure was calculated and his actual achieved scores were compared against those.  Across the board, there were significant deficits between predicted levels of  performance and actual performance all of them statistically significant and ranging between 21 and 33 point difference between predicted versus achieved score.  As expected, the patient's greatest deficits had to do with visual working memory and encoding deficits in auditory working and memory/encoding deficits as well as immediate learning.  The patient while still impaired did not do his poorly on delayed recall suggesting as before that his memory deficits primarily have to do with encoding deficits rather than retrieval deficits.  Summary of Results:   Overall, the results of the current neuropsychological evaluation do suggest that there are a number of areas of cognitive deficits objectively assessed.  All of these measures though weighted heavily  on both attention and concentration as well as overall information processing speed.  Even on measures of reasoning and problem solving the primary deficit had to do with time assessed measures with the patient would actually have difficulty completing a task on time but when provided with greater time would eventually come up with the correct answer but outside the limits for correct response.  The patient's verbal skills and verbal knowledge appear to be generally well retained but the patient shows significant deficits with regard to both auditory and visual encoding abilities/working memory, information processing speed and overall speed of mental operations and difficulty initially learning story new information.  While limited, the initially learned information was retained and did remain available for later recall.  Almost all of the deficits have to do with information processing speed, encoding deficits and initial learning.  Impression/Diagnosis:   Overall, the results of the current neuropsychological evaluation are not consistent with objective findings of any type of progressive cortical or subcortical dementia.  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  The patient reports that his primary difficulties have been happening since his traumatic experiences, which produced significant posttraumatic stress disorder as well as the development of significant depression anxiety.  The patient also has gained a considerable a lot of weight and is now morbidly obese and has recently been diagnosed with significant sleep apnea along with his severe depression/anxiety and PTSD.  As far as diagnostic considerations I do think that the primary issues producing his significant subjective and objective findings for changes in cognitive functioning related to information processing speed/reasoning and problem-solving abilities and significant attentional and memory deficits are all primarily functional in nature owing primarily to a combination of severe depression and anxiety and PTSD as well as more recent development of significant sleep apneas.  The patient has been prescribed an AutoPap for his sleep apnea and I would suspect that if he is able to utilize this device consistently that he will see some improvements in his overall cognitive functioning.  The patient has significant issues with insomnia and there were multiple instances where his home sleep study showed desaturations in the 80s and even down into the 70s.  As far as other treatment recommendations beyond regular and active use of AutoPap/CPAP I would highly encourage the patient to work more diligently on weight control and weight management as that also may help his sleep apneas but also be helpful for his issues of depression and anxiety.  Continued psychiatric management with Dr. Harrington Challenger will  also be important as the patient has been dealing with severe recurrent depression and PTSD for many years now.  I have a schedule time to meet with the patient and his wife go over the results of this evaluation and we will talk about recommendations going forward in person and address any individual recommendations that arise.  Diagnosis:    Memory difficulties  Chronic posttraumatic stress disorder  Insomnia due to mental disorder  Severe episode of recurrent major depressive disorder, without psychotic features ([place])   _____________________ Ilean Skill, Psy.D. Clinical Neuropsychologist

## 2021-01-20 ENCOUNTER — Encounter (HOSPITAL_COMMUNITY): Payer: Self-pay

## 2021-01-31 ENCOUNTER — Other Ambulatory Visit: Payer: Self-pay

## 2021-01-31 ENCOUNTER — Encounter: Payer: Medicare HMO | Attending: Psychology | Admitting: Psychology

## 2021-01-31 DIAGNOSIS — F5105 Insomnia due to other mental disorder: Secondary | ICD-10-CM

## 2021-01-31 DIAGNOSIS — G4733 Obstructive sleep apnea (adult) (pediatric): Secondary | ICD-10-CM

## 2021-01-31 DIAGNOSIS — F4312 Post-traumatic stress disorder, chronic: Secondary | ICD-10-CM | POA: Diagnosis not present

## 2021-01-31 DIAGNOSIS — F319 Bipolar disorder, unspecified: Secondary | ICD-10-CM | POA: Diagnosis not present

## 2021-01-31 DIAGNOSIS — R413 Other amnesia: Secondary | ICD-10-CM | POA: Diagnosis not present

## 2021-01-31 DIAGNOSIS — F332 Major depressive disorder, recurrent severe without psychotic features: Secondary | ICD-10-CM

## 2021-01-31 NOTE — Progress Notes (Signed)
01/31/2021: 4 PM-5 PM: Today's visit was an in person visit that was conducted in my outpatient clinic office with the patient and his wife present.  Today I provided feedback regarding the results of the recent neuropsychological evaluation.  I will include a copy of the summary and impressions from that evaluation that can be found in its entirety in the patient's EMR dated 12/07/2020.  We reviewed the results which essentially highlight the likelihood that the patient's cognitive difficulties and memory etc. have a functional component to them related to his severe PTSD and major depressive events in combination with his insomnia and now recently diagnosed obstructive sleep apnea.  The patient has been prescribed an AutoPap but because of supply chain issues they are still backordered and the patient has not been able to start using it again.  I highly encouraged the patient to work as hard and diligently as he can getting used to this device as I suspect it will help him a lot.  The patient continues to have a lot of psychosocial stressors at this time particularly with his relationship not just himself but with his wife's relationship with her stepson.    Summary of Results:                        Overall, the results of the current neuropsychological evaluation do suggest that there are a number of areas of cognitive deficits objectively assessed.  All of these measures though weighted heavily on both attention and concentration as well as overall information processing speed.  Even on measures of reasoning and problem solving the primary deficit had to do with time assessed measures with the patient would actually have difficulty completing a task on time but when provided with greater time would eventually come up with the correct answer but outside the limits for correct response.  The patient's verbal skills and verbal knowledge appear to be generally well retained but the patient shows significant  deficits with regard to both auditory and visual encoding abilities/working memory, information processing speed and overall speed of mental operations and difficulty initially learning story new information.  While limited, the initially learned information was retained and did remain available for later recall.  Almost all of the deficits have to do with information processing speed, encoding deficits and initial learning.  Impression/Diagnosis:                     Overall, the results of the current neuropsychological evaluation are not consistent with objective findings of any type of progressive cortical or subcortical dementia.  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  The patient reports that his primary difficulties have been happening since his traumatic experiences, which produced significant posttraumatic stress disorder as well as the development of significant depression anxiety.  The patient also has gained a considerable a lot of weight and is now morbidly obese and has recently been diagnosed with significant sleep apnea along with his severe depression/anxiety and PTSD.  As far as diagnostic considerations I do think that the primary issues producing his significant subjective and objective findings for changes in cognitive functioning related to  information processing speed/reasoning and problem-solving abilities and significant attentional and memory deficits are all primarily functional in nature owing primarily to a combination of severe depression and anxiety and PTSD as well as more recent development of significant sleep apneas.  The patient has been prescribed an AutoPap for  his sleep apnea and I would suspect that if he is able to utilize this device consistently that he will see some improvements in his overall cognitive functioning.  The patient has significant issues with insomnia and there were multiple instances where his home sleep study showed desaturations in the 80s and even down into the 70s.  As far as other treatment recommendations beyond regular and active use of AutoPap/CPAP I would highly encourage the patient to work more diligently on weight control and weight management as that also may help his sleep apneas but also be helpful for his issues of depression and anxiety.  Continued psychiatric management with Dr. Harrington Challenger will also be important as the patient has been dealing with severe recurrent depression and PTSD for many years now.  I have a schedule time to meet with the patient and his wife go over the results of this evaluation and we will talk about recommendations going forward in person and address any individual recommendations that arise.  Diagnosis:                               Memory difficulties  Chronic posttraumatic stress disorder  Insomnia due to mental disorder  Severe episode of recurrent major depressive disorder, without psychotic features ([place])   _____________________ Ilean Skill, Psy.D. Clinical Neuropsychologist

## 2021-02-07 ENCOUNTER — Ambulatory Visit: Payer: Self-pay | Admitting: Neurology

## 2021-02-08 ENCOUNTER — Encounter: Payer: Self-pay | Admitting: Neurology

## 2021-02-08 ENCOUNTER — Ambulatory Visit: Payer: Medicare HMO | Admitting: Neurology

## 2021-02-08 VITALS — BP 149/84 | HR 77 | Ht 72.0 in | Wt 305.0 lb

## 2021-02-08 DIAGNOSIS — R413 Other amnesia: Secondary | ICD-10-CM

## 2021-02-08 DIAGNOSIS — G4733 Obstructive sleep apnea (adult) (pediatric): Secondary | ICD-10-CM | POA: Diagnosis not present

## 2021-02-08 NOTE — Patient Instructions (Addendum)
It was good to see you both again today.  Thankfully, your current work-up does not suggest an underlying dementia diagnosis.  As discussed, we will proceed with AutoPap therapy for your sleep apnea and see you in follow-up in about 2 to 3 months after starting treatment.  Please call us once you have your AutoPap machine so we can schedule your follow-up accordingly.  Please try to hydrate better with water, 6 to 8 cups/day are recommended, 8 ounce size each.  Please try to exercise regularly and continue to pursue weight loss.  Marland Kitchen

## 2021-02-08 NOTE — Progress Notes (Signed)
Subjective:    Patient ID: Greg Kemp is a 60 y.o. male.  HPI     Interim history:   Mr. Greg Kemp is a 60 year old right-handed gentleman with an underlying medical history of thyroid disease, morbid obesity with a BMI of over 40, sleep apnea (suspected?), hyperlipidemia, reflux disease, depression, anxiety, and chronic kidney disease, who presents for follow-up consultation of his memory loss.  The patient is accompanied by his wife again today.  I first met him at the request of his primary care on 12/01/2020, at which time he reported a several year history of difficulty with short-term and long-term memory.  He had confusion at times, he had lapses in his memory at times even for important events in the past.  I suggested further work-up in the form of sleep study and brain MRI.  He had been referred by his psychiatrist to neuropsychology.  He had a brain MRI without contrast on 12/14/2020 and I reviewed the results:  IMPRESSION:    Unremarkable MRI brain without contrast.  No acute findings.  We called him with his test results.  He had a home sleep test on 12/22/2020, this indicated overall mild obstructive sleep apnea but he did have desaturations into that 80s and 70s, AHI was 5.8/h, O2 nadir 73%.  He was advised to start AutoPap therapy.  He has not been set up yet.  He had neuropsychological evaluation with Dr. Sima Matas and a feedback appointment on 01/31/2021 and I reviewed the results and recommendations: <<Impression/Diagnosis:                     Overall, the results of the current neuropsychological evaluation are not consistent with objective findings of any type of progressive cortical or subcortical dementia.  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  The patient reports that his primary difficulties have been happening since his traumatic experiences, which produced significant posttraumatic stress disorder as well as the development of significant depression anxiety.  The patient also has gained a considerable a lot of weight and is now morbidly obese and has recently been diagnosed with significant sleep apnea along with his severe depression/anxiety and PTSD.   As far as diagnostic considerations I do think that the primary issues producing his significant subjective and objective findings for changes in cognitive functioning related to information processing speed/reasoning and problem-solving abilities and significant attentional and memory deficits are all primarily functional in nature owing primarily to a combination of severe depression and anxiety and PTSD as well as more recent development of significant sleep apneas.  The patient has been prescribed an AutoPap for his sleep apnea and I would suspect that if he is able to utilize this device consistently that he will see some improvements in his overall cognitive functioning.  The patient has significant issues with insomnia and there were multiple instances where his home sleep study showed desaturations in the 80s and even down into the 70s.   As far as other treatment recommendations beyond regular and active use of AutoPap/CPAP I would highly encourage the patient to work more diligently on weight control and weight management as that also may help his  sleep apneas but also be helpful for his issues of depression and anxiety.  Continued psychiatric management with Dr. Harrington Challenger will also be important as the patient has been dealing with severe recurrent depression and PTSD for many years now.   I have a schedule time to meet with the patient and his wife go over the results of this evaluation and we will talk  about recommendations going forward in person and address any individual recommendations that arise.   Diagnosis:                                Memory difficulties   Chronic posttraumatic stress disorder   Insomnia due to mental disorder   Severe episode of recurrent major depressive disorder, without psychotic features (HCC)>>  Today, 02/08/2021: He reports feeling about the same.  He has been weaned off of [place].  He is no longer on Cogentin.  He is trying to exercise and goes to the gym but not regularly.  He reports that they had a lot going on in their family.  They just got back from the [facility] from seeing their son.  He has not had any recent falls.  He does not hydrate very well by self admission, likes to drink soda, some coffee in the morning.  The patient's allergies, current medications, family history, past medical history, past social history, past surgical history and problem list were reviewed and updated as appropriate.   Previously:   12/01/2020: (He) reports worsening short-term or long-term memory issues for the past years.  Per wife, he has had memory issues for 8 or 9 years.  He has had trouble remembering even major events such as their daughter's wedding or the birth of her grandchild.  He gets confused from time to time and has lapses in memory, gets disoriented while driving.  Symptoms started after they lost their adopted daughter to cancer in 2010.  He has had severe bouts of depression, he is in close follow-up with psychiatry and has had multiple medication changes over time.  He has been on [place] for years.  He has been on Cogentin.  He had evaluation for sleep testing some years ago but according to the wife he was not able to sleep.  He is not on a CPAP machine.  He does have daytime somnolence.  He takes trazodone at night for sleep.   I reviewed your office note from 07/14/2020.  Of note, he is on multiple medications including psychotropic medications.  He  is followed by Dr. Harrington Challenger in psychiatry.  He is on alprazolam, 1 mg 3 times daily, trazodone 100 mg at night, generic Cymbalta 60 mg daily, gabapentin 300 mg daily as needed and pantoprazole.  He also takes [place] and Cogentin.  He was referred to Dr. Sima Matas by Dr. Harrington Challenger.  He had an appointment with neuropsychology on 11/01/2020.  He had a remote brain MRI without contrast on 08/26/2013 for syncope, memory loss, headaches and gait unsteadiness for 1 year, I reviewed the results: Impression: Unremarkable brain MRI for age. He is scheduled for cognitive testing with Dr. Ferne Coe office on 12/06/2020.   He lives with his wife.  They have 4 biological children and had 1 adopted child. He worked as a Marine scientist, he quit working in 2012.  He had blood work through your office which showed an abnormal TSH back in August 2021.  He had repeat blood testing recently  on 11/25/2020 and we were able to request test results from your office, CBC with differential was unremarkable, CMP showed normal findings with BUN of 11, creatinine 1.22, normal AST and ALT, total cholesterol 141, triglycerides 86, HDL 29, LDL 95.  A1c was 5.2, free T4 mildly elevated at 1.87, TSH low at 0.023. He does not a telltale family history of dementia.  Mom lived to be in her 30s and father lived to be in his 62s.  He is not aware of any memory loss in his grandparents.  He has one half sister but has not had any recent contact with her.  He has been working on weight loss.  In the past 1+ year he has lost about 100 pounds.  He used to work as a Marine scientist in [employer]. Epworth sleepiness score is 4 out of 24.   His Past Medical History Is Significant For: Past Medical History:  Diagnosis Date  . Acid reflux   . Anxiety   . Chronic kidney disease    Stage III  . Depression   . Depression   . GERD (gastroesophageal reflux disease)   . Hyperlipidemia   . Morbid obesity ([place])   . Sleep apnea   . Thyroid disease     His Past Surgical  History Is Significant For: Past Surgical History:  Procedure Laterality Date  . COLONOSCOPY WITH ESOPHAGOGASTRODUODENOSCOPY (EGD)  2010   Dr. Gala Romney: ulcerative/erosive reflux esophagitis, noncritical schatki's ring, multiple bulbar erosions with normal sb biopsies, colonic diverticulosis, normal TI  . COLONOSCOPY WITH PROPOFOL N/A 10/30/2019   Procedure: COLONOSCOPY WITH PROPOFOL;  Surgeon: Daneil Dolin, MD;  Location: [facility];  Service: Endoscopy;  Laterality: N/A;  1:30pm  . POLYPECTOMY  10/30/2019   Procedure: POLYPECTOMY;  Surgeon: Daneil Dolin, MD;  Location: [facility];  Service: Endoscopy;;  . TONSILLECTOMY    . Upper and Lower GI      His Family History Is Significant For: Family History  Problem Relation Age of Onset  . Alcohol abuse Father   . Colon cancer Father   . COPD Father   . Depression Daughter        acute  . Sexual abuse Daughter   . Lung disease Mother   . COPD Mother   . Thyroid disease Mother   . Alcohol abuse Paternal Uncle   . Colon cancer Daughter 24  . ADD / ADHD Neg Hx   . Anxiety disorder Neg Hx   . OCD Neg Hx   . Drug abuse Neg Hx   . Bipolar disorder Neg Hx   . Dementia Neg Hx   . Paranoid behavior Neg Hx   . Schizophrenia Neg Hx   . Seizures Neg Hx   . Physical abuse Neg Hx     His Social History Is Significant For: Social History   Socioeconomic History  . Marital status: Married    Spouse name: Not on file  . Number of children: Not on file  . Years of education: Not on file  . Highest education level: Not on file  Occupational History  . Not on file  Tobacco Use  . Smoking status: Never Smoker  . Smokeless tobacco: Never Used  Substance and Sexual Activity  . Alcohol use: Yes    Comment: occ  . Drug use: No  . Sexual activity: Yes    Birth control/protection: Surgical  Other Topics Concern  . Not on file  Social History Narrative  . Not  on file   Social Determinants of Health   Financial Resource  Strain: Not on file  Food Insecurity: Not on file  Transportation Needs: Not on file  Physical Activity: Not on file  Stress: Not on file  Social Connections: Not on file    His Allergies Are:  No Known Allergies:   His Current Medications Are:  Outpatient Encounter Medications as of 02/08/2021  Medication Sig  . ALPRAZolam (XANAX) 1 MG tablet Take 1 tablet (1 mg total) by mouth 4 (four) times daily.  . DULoxetine (CYMBALTA) 60 MG capsule Take 1 capsule (60 mg total) by mouth daily.  Marland Kitchen gabapentin (NEURONTIN) 300 MG capsule Take 300 mg by mouth 3 (three) times daily as needed (pain).  Marland Kitchen levothyroxine (SYNTHROID) 175 MCG tablet Take 175 mcg by mouth daily before breakfast.   . Multiple Vitamin (MULTIVITAMIN WITH MINERALS) TABS tablet Take 1 tablet by mouth daily.  . pantoprazole (PROTONIX) 40 MG tablet Take 40 mg by mouth daily.  . potassium chloride (KLOR-CON) 10 MEQ tablet Take 10 mEq by mouth every other day.  . traZODone (DESYREL) 100 MG tablet TAKE 1 TABLET (100 MG TOTAL) BY MOUTH AT BEDTIME.  . [DISCONTINUED] benztropine (COGENTIN) 0.5 MG tablet Take 1 tablet (0.5 mg total) by mouth 2 (two) times daily. (Patient not taking: Reported on 02/08/2021)  . [DISCONTINUED] lurasidone (LATUDA) 20 MG TABS tablet Take 1 tablet (20 mg total) by mouth daily. (Patient not taking: Reported on 02/08/2021)   No facility-administered encounter medications on file as of 02/08/2021.  :  Review of Systems:  Out of a complete 14 point review of systems, all are reviewed and negative with the exception of these symptoms as listed below: Review of Systems  Neurological:       Here for f/u on Neuropsych testing. Pt reports memory loss is the same since last visit. Pt waiting for cpap machine.     Objective:  Neurological Exam  Physical Exam Physical Examination:   Vitals:   02/08/21 1019  BP: (!) 149/84  Pulse: 77    General Examination: The patient is a very pleasant 59 y.o. male in no acute  distress. He appears well-developed and well-nourished and well groomed.   HEENT: Normocephalic, atraumatic, pupils are equal, round and reactive to light, he has mild bilateral cataracts, extraocular tracking is well-preserved, face is symmetric, mild facial masking noted, neck is supple with full range of motion.  Hearing is grossly intact, stable. Speech without dysarthria or voice tremor, more talkative today. Airway examination reveals a small airway, tongue protrudes centrally, palate elevates symmetrically, no carotid bruits.    Chest: Clear to auscultation without wheezing, rhonchi or crackles noted.  Heart: S1+S2+0, regular and normal without murmurs, rubs or gallops noted.   Abdomen: Soft, non-tender and non-distended with normal bowel sounds appreciated on auscultation.  Extremities: There is no trace edema in the distal lower extremities bilaterally.   Skin: Warm and dry without trophic changes noted..  Musculoskeletal: exam reveals no obvious joint deformities, tenderness or joint swelling.   Neurologically:  Mental status: The patient is awake, alert and oriented in all 4 spheres. His immediate and remote memory, attention, language skills and fund of knowledge are mildly impaired.  He is more conversant today.  On 12/01/2020: MMSE: 24/30, CDT: 4/4, AFT: 8/min.  Speech: He is soft-spoken but more verbal. Mood is constricted in affect appears flat.   Cranial nerves II - XII are as described above under HEENT exam. In addition: shoulder  shrug is normal with equal shoulder height noted. Motor exam: Normal bulk, strength and tone is noted. There is no tremor, fine motor skills are mildly slow throughout, no resting tremor noted.   Cerebellar testing: No dysmetria or intention tremor. There is no truncal or gait ataxia.  Sensory exam: intact to light touch in the upper and lower extremities.  Gait, station and balance: He stands without difficulty.   He walks without  difficulty.  No limp, preserved arm swing noted.  Assessment and plan:   In summary, ZANDEN COLVER is a very pleasant 60 year old male with an underlying medical history of thyroid disease, morbid obesity with a BMI of over 40, OSA, hyperlipidemia, reflux disease, depression, anxiety, and chronic kidney disease, who presents for follow-up consultation of his memory loss.  Recent brain MRI in March 2022 did not show any obvious structural abnormalities.  Recent evaluation with neuropsychology was not supportive of a primary underlying neurocognitive disorder.  Evaluation with home sleep testing in March 2022 showed mild obstructive sleep apnea but desaturations were noted into the 80s and at times mid to lower 70s.  He is advised to start AutoPap therapy.  We are pending set up. We have been in touch with his DME company.  He is advised to follow-up for his PAP compliance in about 30 to 89 days after his AutoPap start.  We talked about his test results in detail today.  He is agreeable to starting treatment for sleep apnea.  We talked about the importance of healthy lifestyle.  He is advised to drink more water to stay better hydrated and to exercise on a regular basis and to continue to work on weight loss.  We will plan a follow-up in this clinic in about 3 months, we will time it with his AutoPap start.  He can see one of our nurse practitioners at the time.  I answered all the questions today and the patient and his wife were in agreement with the plan.   I spent 32 minutes in total face-to-face time and in reviewing records during pre-charting, more than 50% of which was spent in counseling and coordination of care, reviewing test results, reviewing medications and treatment regimen and/or in discussing or reviewing the diagnosis of memory loss, OSA, the prognosis and treatment options. Pertinent laboratory and imaging test results that were available during this visit with the patient were reviewed by me  and considered in my medical decision making (see chart for details).

## 2021-02-18 DIAGNOSIS — E1165 Type 2 diabetes mellitus with hyperglycemia: Secondary | ICD-10-CM | POA: Diagnosis not present

## 2021-02-18 DIAGNOSIS — K219 Gastro-esophageal reflux disease without esophagitis: Secondary | ICD-10-CM | POA: Diagnosis not present

## 2021-03-22 ENCOUNTER — Telehealth (HOSPITAL_COMMUNITY): Payer: Self-pay | Admitting: *Deleted

## 2021-03-22 NOTE — Telephone Encounter (Signed)
Patient is needing refills for all of his medications provider prescribes for him. Patient appt was cancelled due to provider being on medical leave. Message was left to call office to resch appt.

## 2021-03-24 ENCOUNTER — Other Ambulatory Visit (HOSPITAL_COMMUNITY): Payer: Self-pay | Admitting: Psychiatry

## 2021-03-24 MED ORDER — DULOXETINE HCL 60 MG PO CPEP: 60.0000 mg | ORAL_CAPSULE | Freq: Every day | ORAL | 2 refills | Status: DC

## 2021-03-24 MED ORDER — ALPRAZOLAM 1 MG PO TABS: 1.0000 mg | ORAL_TABLET | Freq: Four times a day (QID) | ORAL | 2 refills | Status: DC

## 2021-03-24 MED ORDER — TRAZODONE HCL 100 MG PO TABS: ORAL_TABLET | ORAL | 2 refills | Status: DC

## 2021-03-24 NOTE — Telephone Encounter (Signed)
sent 

## 2021-04-05 ENCOUNTER — Telehealth (HOSPITAL_COMMUNITY): Payer: Medicare HMO | Admitting: Psychiatry

## 2021-04-07 DIAGNOSIS — E1165 Type 2 diabetes mellitus with hyperglycemia: Secondary | ICD-10-CM | POA: Diagnosis not present

## 2021-04-07 DIAGNOSIS — K219 Gastro-esophageal reflux disease without esophagitis: Secondary | ICD-10-CM | POA: Diagnosis not present

## 2021-05-02 ENCOUNTER — Telehealth: Payer: Self-pay | Admitting: *Deleted

## 2021-05-02 NOTE — Telephone Encounter (Signed)
I called spoke to wife.  Pt has not received machine.  I did give her the # to [facility] to f/u on. She will call them.  I will cancel appt and they are to call when receives to make appt. She verbalized understanding. She appreciated call.

## 2021-05-03 ENCOUNTER — Encounter (HOSPITAL_COMMUNITY): Payer: Medicare HMO | Admitting: Psychiatry

## 2021-05-03 ENCOUNTER — Other Ambulatory Visit: Payer: Self-pay

## 2021-05-03 ENCOUNTER — Ambulatory Visit: Payer: Self-pay | Admitting: Neurology

## 2021-05-16 ENCOUNTER — Other Ambulatory Visit: Payer: Self-pay

## 2021-05-16 ENCOUNTER — Encounter (HOSPITAL_COMMUNITY): Payer: Self-pay | Admitting: Psychiatry

## 2021-05-16 ENCOUNTER — Telehealth (INDEPENDENT_AMBULATORY_CARE_PROVIDER_SITE_OTHER): Payer: Medicare HMO | Admitting: Psychiatry

## 2021-05-16 DIAGNOSIS — F419 Anxiety disorder, unspecified: Secondary | ICD-10-CM

## 2021-05-16 DIAGNOSIS — F332 Major depressive disorder, recurrent severe without psychotic features: Secondary | ICD-10-CM

## 2021-05-16 MED ORDER — DULOXETINE HCL 60 MG PO CPEP: 60.0000 mg | ORAL_CAPSULE | Freq: Every day | ORAL | 2 refills | Status: DC

## 2021-05-16 MED ORDER — TRAZODONE HCL 100 MG PO TABS: ORAL_TABLET | ORAL | 2 refills | Status: DC

## 2021-05-16 MED ORDER — ALPRAZOLAM 1 MG PO TABS: 1.0000 mg | ORAL_TABLET | Freq: Four times a day (QID) | ORAL | 2 refills | Status: DC

## 2021-05-16 NOTE — Progress Notes (Signed)
Virtual Visit via Video Note  I connected with Greg Kemp on 05/16/21 at  1:20 PM EDT by a video enabled telemedicine application and verified that I am speaking with the correct person using two identifiers.  Location: Patient: home Provider: home office   I discussed the limitations of evaluation and management by telemedicine and the availability of in person appointments. The patient expressed understanding and agreed to proceed.      I discussed the assessment and treatment plan with the patient. The patient was provided an opportunity to ask questions and all were answered. The patient agreed with the plan and demonstrated an understanding of the instructions.   The patient was advised to call back or seek an in-person evaluation if the symptoms worsen or if the condition fails to improve as anticipated.  I provided 15 minutes of non-face-to-face time during this encounter.   Greg Spiller, MD  [facility] MD/PA/NP OP Progress Note  05/16/2021 1:42 PM Greg Kemp  MRN:  [id number]  Chief Complaint:  Chief Complaint   Anxiety; Depression; Follow-up    HPI: This is a 60 year old white male who lives with his wife in [place].  He is on disability.  The patient returns for follow-up after 3 months regarding his posttraumatic stress disorder and anxiety.  He states that things have been "up-and-down" in their home.  Last month his wife had surgery for a hiatal hernia.  Unfortunately it did not work out well and the stomach herniated back into the esophagus.  She also developed a pulmonary embolism.  She had to go back to the hospital and for a while could only eat thick liquids etc.  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  She is slowly recovering.  The patient also underwent memory testing recently and was found to have some deficits in memory these were thought to be secondary to his depression and PTSD and not any symptom of severe dementia.  He  states that despite recent stressors his mood has been stable and his anxiety is under good control and he is sleeping well Visit Diagnosis:    ICD-10-CM   1. Major depressive disorder, recurrent, severe without psychotic features ([place])  F33.2     2. Anxiety  F41.9       Past Psychiatric History: Several past hospitalizations but none since 2012  Past Medical History:  Past Medical History:  Diagnosis Date   Acid reflux    Anxiety    Chronic kidney disease    Stage III   Depression    Depression    GERD (gastroesophageal reflux disease)    Hyperlipidemia    Morbid obesity ([employer])    Sleep apnea    Thyroid disease     Past Surgical History:  Procedure Laterality Date   COLONOSCOPY WITH ESOPHAGOGASTRODUODENOSCOPY (EGD)  2010   Dr. Gala Romney: ulcerative/erosive reflux esophagitis, noncritical schatki's ring, multiple bulbar erosions with normal sb biopsies, colonic diverticulosis, normal TI   COLONOSCOPY WITH PROPOFOL N/A 10/30/2019   Procedure: COLONOSCOPY WITH PROPOFOL;  Surgeon: Daneil Dolin, MD;  Location: [facility];  Service: Endoscopy;  Laterality: N/A;  1:30pm   POLYPECTOMY  10/30/2019   Procedure: POLYPECTOMY;  Surgeon: Daneil Dolin, MD;  Location: [facility];  Service: Endoscopy;;   TONSILLECTOMY     Upper and Lower GI      Family Psychiatric History: see below  Family History:  Family History  Problem Relation Age  of Onset   Alcohol abuse Father    Colon cancer Father    COPD Father    Depression Daughter        acute   Sexual abuse Daughter    Lung disease Mother    COPD Mother    Thyroid disease Mother    Alcohol abuse Paternal Uncle    Colon cancer Daughter 56   ADD / ADHD Neg Hx    Anxiety disorder Neg Hx    OCD Neg Hx    Drug abuse Neg Hx    Bipolar disorder Neg Hx    Dementia Neg Hx    Paranoid behavior Neg Hx    Schizophrenia Neg Hx    Seizures Neg Hx    Physical abuse Neg Hx     Social History:  Social History   Socioeconomic  History   Marital status: Married    Spouse name: Not on file   Number of children: Not on file   Years of education: Not on file   Highest education level: Not on file  Occupational History   Not on file  Tobacco Use   Smoking status: Never   Smokeless tobacco: Never  Substance and Sexual Activity   Alcohol use: Yes    Comment: occ   Drug use: No   Sexual activity: Yes    Birth control/protection: Surgical  Other Topics Concern   Not on file  Social History Narrative   Not on file   Social Determinants of Health   Financial Resource Strain: Not on file  Food Insecurity: Not on file  Transportation Needs: Not on file  Physical Activity: Not on file  Stress: Not on file  Social Connections: Not on file    Allergies: No Known Allergies  Metabolic Disorder Labs: No results found for: HGBA1C, MPG No results found for: PROLACTIN Lab Results  Component Value Date   CHOL  06/16/2010    166        ATP III CLASSIFICATION:  <200     mg/dL   Desirable  200-239  mg/dL   Borderline High  >=240    mg/dL   High          TRIG 166 (H) 06/16/2010   HDL 29 (L) 06/16/2010   CHOLHDL 5.7 06/16/2010   VLDL 33 06/16/2010   LDLCALC (H) 06/16/2010    104        Total Cholesterol/HDL:CHD Risk Coronary Heart Disease Risk Table                     Men   Women  1/2 Average Risk   3.4   3.3  Average Risk       5.0   4.4  2 X Average Risk   9.6   7.1  3 X Average Risk  23.4   11.0        Use the calculated Patient Ratio above and the CHD Risk Table to determine the patient's CHD Risk.        ATP III CLASSIFICATION (LDL):  <100     mg/dL   Optimal  100-129  mg/dL   Near or Above                    Optimal  130-159  mg/dL   Borderline  160-189  mg/dL   High  >190     mg/dL   Very High   Lab Results  Component Value Date  TSH 11.645 (H) 12/14/2010   TSH 0.264 (L) 06/16/2010    Therapeutic Level Labs: No results found for: LITHIUM No results found for: VALPROATE No  components found for:  CBMZ  Current Medications: Current Outpatient Medications  Medication Sig Dispense Refill   ALPRAZolam (XANAX) 1 MG tablet Take 1 tablet (1 mg total) by mouth 4 (four) times daily. 120 tablet 2   DULoxetine (CYMBALTA) 60 MG capsule Take 1 capsule (60 mg total) by mouth daily. 90 capsule 2   gabapentin (NEURONTIN) 300 MG capsule Take 300 mg by mouth 3 (three) times daily as needed (pain).     levothyroxine (SYNTHROID) 175 MCG tablet Take 175 mcg by mouth daily before breakfast.      Multiple Vitamin (MULTIVITAMIN WITH MINERALS) TABS tablet Take 1 tablet by mouth daily.     pantoprazole (PROTONIX) 40 MG tablet Take 40 mg by mouth daily.     potassium chloride (KLOR-CON) 10 MEQ tablet Take 10 mEq by mouth every other day.     traZODone (DESYREL) 100 MG tablet TAKE 1 TABLET (100 MG TOTAL) BY MOUTH AT BEDTIME. 90 tablet 2   No current facility-administered medications for this visit.     Musculoskeletal: Strength & Muscle Tone: within normal limits Gait & Station: normal Patient leans: N/A  Psychiatric Specialty Exam: Review of Systems  All other systems reviewed and are negative.  There were no vitals taken for this visit.There is no height or weight on file to calculate BMI.  General Appearance: Casual and Fairly Groomed  Eye Contact:  Good  Speech:  Clear and Coherent  Volume:  Normal  Mood:  Euthymic  Affect:  Appropriate and Congruent  Thought Process:  Goal Directed  Orientation:  Full (Time, Place, and Person)  Thought Content: Rumination   Suicidal Thoughts:  No  Homicidal Thoughts:  No  Memory:  Immediate;   Good Recent;   Good Remote;   Fair  Judgement:  Good  Insight:  Good  Psychomotor Activity:  Normal  Concentration:  Concentration: Fair and Attention Span: Fair  Recall:  [employer] of Knowledge: Good  Language: Good  Akathisia:  No  Handed:  Right  AIMS (if indicated): not done  Assets:  Communication Skills Desire for  Improvement Physical Health Resilience Social Support  ADL's:  Intact  Cognition: WNL  Sleep:  Good   Screenings: Mini-Mental    Flowsheet Row Office Visit from 12/01/2020 in [facility]  Total Score (max 30 points ) 24      PHQ2-9    Flowsheet Row Video Visit from 05/16/2021 in [facility] Video Visit from 01/14/2021 in [facility]  PHQ-2 Total Score 0 0      Flowsheet Row Video Visit from 05/16/2021 in [facility] Video Visit from 01/14/2021 in [facility]  C-SSRS RISK CATEGORY No Risk No Risk        Assessment and Plan: Is a 60 year old male with a history of posttraumatic stress disorder and anxiety.  He is doing well without the lurasidone.  For now he will continue Cymbalta 60 mg daily for depression, trazodone 100 mg at bedtime for sleep and Xanax 1 mg up to 4 times daily for anxiety.  He will return to see me in 3 months   Greg Spiller, MD 05/16/2021, 1:42 PM

## 2021-05-26 DIAGNOSIS — G4733 Obstructive sleep apnea (adult) (pediatric): Secondary | ICD-10-CM | POA: Diagnosis not present

## 2021-05-31 ENCOUNTER — Telehealth: Payer: Self-pay | Admitting: *Deleted

## 2021-05-31 NOTE — Telephone Encounter (Signed)
LVM for pt to call us back

## 2021-05-31 NOTE — Telephone Encounter (Signed)
Received fax from [facility].  Patient was set up on a Luna G3 C2C machine on 18 2022.  He will need an appointment for the insurance requirement between 06/26/2021 and 08/24/2021.

## 2021-06-06 DIAGNOSIS — I10 Essential (primary) hypertension: Secondary | ICD-10-CM | POA: Diagnosis not present

## 2021-06-06 DIAGNOSIS — E0590 Thyrotoxicosis, unspecified without thyrotoxic crisis or storm: Secondary | ICD-10-CM | POA: Diagnosis not present

## 2021-06-07 ENCOUNTER — Other Ambulatory Visit (HOSPITAL_COMMUNITY): Payer: Self-pay | Admitting: Psychiatry

## 2021-06-07 MED ORDER — TRAZODONE HCL 100 MG PO TABS: ORAL_TABLET | ORAL | 2 refills | Status: DC

## 2021-06-08 DIAGNOSIS — E876 Hypokalemia: Secondary | ICD-10-CM | POA: Diagnosis not present

## 2021-06-08 DIAGNOSIS — E039 Hypothyroidism, unspecified: Secondary | ICD-10-CM | POA: Diagnosis not present

## 2021-06-08 DIAGNOSIS — G4700 Insomnia, unspecified: Secondary | ICD-10-CM | POA: Diagnosis not present

## 2021-06-08 DIAGNOSIS — K219 Gastro-esophageal reflux disease without esophagitis: Secondary | ICD-10-CM | POA: Diagnosis not present

## 2021-06-08 DIAGNOSIS — E782 Mixed hyperlipidemia: Secondary | ICD-10-CM | POA: Diagnosis not present

## 2021-06-08 DIAGNOSIS — M545 Low back pain, unspecified: Secondary | ICD-10-CM | POA: Diagnosis not present

## 2021-06-08 DIAGNOSIS — E1165 Type 2 diabetes mellitus with hyperglycemia: Secondary | ICD-10-CM | POA: Diagnosis not present

## 2021-06-08 DIAGNOSIS — F411 Generalized anxiety disorder: Secondary | ICD-10-CM | POA: Diagnosis not present

## 2021-06-08 DIAGNOSIS — F3181 Bipolar II disorder: Secondary | ICD-10-CM | POA: Diagnosis not present

## 2021-06-08 DIAGNOSIS — N1831 Chronic kidney disease, stage 3a: Secondary | ICD-10-CM | POA: Diagnosis not present

## 2021-06-09 ENCOUNTER — Other Ambulatory Visit (HOSPITAL_COMMUNITY): Payer: Self-pay | Admitting: Psychiatry

## 2021-06-09 MED ORDER — TRAZODONE HCL 100 MG PO TABS: ORAL_TABLET | ORAL | 2 refills | Status: DC

## 2021-06-21 NOTE — Progress Notes (Signed)
This encounter was created in error - please disregard.

## 2021-06-26 DIAGNOSIS — G4733 Obstructive sleep apnea (adult) (pediatric): Secondary | ICD-10-CM | POA: Diagnosis not present

## 2021-07-08 DIAGNOSIS — K219 Gastro-esophageal reflux disease without esophagitis: Secondary | ICD-10-CM | POA: Diagnosis not present

## 2021-07-08 DIAGNOSIS — E1165 Type 2 diabetes mellitus with hyperglycemia: Secondary | ICD-10-CM | POA: Diagnosis not present

## 2021-07-26 DIAGNOSIS — G4733 Obstructive sleep apnea (adult) (pediatric): Secondary | ICD-10-CM | POA: Diagnosis not present

## 2021-08-03 ENCOUNTER — Encounter: Payer: Self-pay | Admitting: Neurology

## 2021-08-03 ENCOUNTER — Ambulatory Visit (INDEPENDENT_AMBULATORY_CARE_PROVIDER_SITE_OTHER): Payer: Medicare HMO | Admitting: Neurology

## 2021-08-03 VITALS — BP 127/75 | HR 63 | Ht 71.0 in | Wt 292.0 lb

## 2021-08-03 DIAGNOSIS — G4733 Obstructive sleep apnea (adult) (pediatric): Secondary | ICD-10-CM

## 2021-08-03 DIAGNOSIS — Z789 Other specified health status: Secondary | ICD-10-CM | POA: Diagnosis not present

## 2021-08-03 NOTE — Patient Instructions (Signed)
It was nice to see you both again today.  I am sorry to hear that you have had trouble with your AutoPap machine, particularly with your full facemask due to dental issues.  I will write for a nasal mask such as nasal pillows or nose mask.  You may need a chinstrap as well.  Please continue using your autoPAP regularly. While your insurance requires that you use PAP at least 4 hours each night on 70% of the nights, I recommend, that you not skip any nights and use it throughout the night if you can. Getting used to PAP and staying with the treatment long term does take time and patience and discipline. Untreated obstructive sleep apnea when it is moderate to severe can have an adverse impact on cardiovascular health and raise her risk for heart disease, arrhythmias, hypertension, congestive heart failure, stroke and diabetes. Untreated obstructive sleep apnea causes sleep disruption, nonrestorative sleep, and sleep deprivation. This can have an impact on your day to day functioning and cause daytime sleepiness and impairment of cognitive function, memory loss, mood disturbance, and problems focussing. Using PAP regularly can improve these symptoms.  Please follow-up routinely to see one of our nurse practitioners in 3 to 4 months.

## 2021-08-03 NOTE — Progress Notes (Signed)
Subjective:    Patient ID: Greg Kemp is a 60 y.o. male.  HPI    Interim history:   Greg Kemp is a 60 year old right-handed gentleman with an underlying medical history of thyroid disease, morbid obesity with a BMI of over 40, sleep apnea (suspected?), hyperlipidemia, reflux disease, depression, anxiety, and chronic kidney disease, who presents for follow-up consultation of his memory loss and sleep apnea, after starting autoPAP.  The patient is accompanied by his wife again today. I last saw him on 02/08/2021, at which time he reported feeling about the same.  He had weaned off of [place].  He was no longer on Cogentin.  We talked about his neuropsychological evaluation and recommendations.  He was agreeable to starting AutoPap therapy for his overall mild obstructive sleep apnea.  A home sleep test from 12/22/2020 showed an AHI of 5.8/h, O2 nadir 73%.  His set up date was 05/26/2021.  He has a Radiation protection practitioner, 08/03/2021: I reviewed his AutoPap compliance data from 05/30/2021 through 06/29/2021, which is a total of 31 days, during which time he used his machine 21 days with percent use days greater than 4 hours at 45.2%, indicating suboptimal compliance, average usage for days on treatment of 4 hours and 21 minutes.  Residual AHI at goal at 1/h, average leak 15.4 L/min, pressure range of 6 to 12 cm.Marland Kitchen  He reports struggling with the AutoPap in particular because of tooth pain.  He has to get his teeth pulled and eventually will pursue dentures.  Currently, the full facemask is pushing into the front teeth on the bottom and resulting in pain and even his lip being cut once.  He is motivated to continue with his AutoPap but is currently struggling with tolerance.  He has an appointment with the dentist in mid November, he had to reschedule the appointment because of his wife's illness.  He endorses stress and difficulty sleeping, continues to take trazodone at night for sleep and also takes 5 to 10 mg of  melatonin currently at night.   The patient's allergies, current medications, family history, past medical history, past social history, past surgical history and problem list were reviewed and updated as appropriate.    Previously:    I first met him at the request of his primary care on 12/01/2020, at which time he reported a several year history of difficulty with short-term and long-term memory.  He had confusion at times, he had lapses in his memory at times even for important events in the past.  I suggested further work-up in the form of sleep study and brain MRI.  He had been referred by his psychiatrist to neuropsychology.   He had a brain MRI without contrast on 12/14/2020 and I reviewed the results:  IMPRESSION:    Unremarkable MRI brain without contrast.  No acute findings.   We called him with his test results.   He had a home sleep test on 12/22/2020, this indicated overall mild obstructive sleep apnea but he did have desaturations into that 80s and 70s, AHI was 5.8/h, O2 nadir 73%.  He was advised to start AutoPap therapy.  He has not been set up yet.   He had neuropsychological evaluation with Dr. Sima Matas and a feedback appointment on 01/31/2021 and I reviewed the results and recommendations: <<Impression/Diagnosis:                     Overall, the results of the current neuropsychological evaluation are  not consistent with objective findings of any type of progressive cortical or subcortical dementia.  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  The patient reports that his primary difficulties have been happening since his traumatic experiences, which produced significant  posttraumatic stress disorder as well as the development of significant depression anxiety.  The patient also has gained a considerable a lot of weight and is now morbidly obese and has recently been diagnosed with significant sleep apnea along with his severe depression/anxiety and PTSD.   As far as diagnostic considerations I do think that the primary issues producing his significant subjective and objective findings for changes in cognitive functioning related to information processing speed/reasoning and problem-solving abilities and significant attentional and memory deficits are all primarily functional in nature owing primarily to a combination of severe depression and anxiety and PTSD as well as more recent development of significant sleep apneas.  The patient has been prescribed an AutoPap for his sleep apnea and I would suspect that if he is able to utilize this device consistently that he will see some improvements in his overall cognitive functioning.  The patient has significant issues with insomnia and there were multiple instances where his home sleep study showed desaturations in the 80s and even down into the 70s.   As far as other treatment recommendations beyond regular and active use of AutoPap/CPAP I would highly encourage the patient to work more diligently on weight control and weight management as that also may help his sleep apneas but also be helpful for his issues of depression and anxiety.  Continued psychiatric management with Dr. Harrington Challenger will also be important as the patient has been dealing with severe recurrent depression and PTSD for many years now.   I have a schedule time to meet with the patient and his wife go over the results of this evaluation and we will talk about recommendations going forward in person and address any individual recommendations that arise.   Diagnosis:                                Memory difficulties   Chronic posttraumatic stress disorder    Insomnia due to mental disorder   Severe episode of recurrent major depressive disorder, without psychotic features (HCC)>>     12/01/2020: (He) reports worsening short-term or long-term memory issues for the past years.  Per wife, he has had memory issues for 8 or 9 years.  He has had trouble remembering even major events such as their daughter's wedding or the birth of her grandchild.  He gets confused from time to time and has lapses in memory, gets disoriented while driving.  Symptoms started after they lost their adopted daughter to cancer in 2010.  He has had severe bouts of depression, he is in close follow-up with psychiatry and has had multiple medication changes over time.  He has been on [place] for years.  He has been on Cogentin.  He had evaluation for sleep testing some years ago but according to the wife he was not able to sleep.  He is not on a CPAP machine.  He does have daytime somnolence.  He takes trazodone at night for sleep.   I reviewed your office note from 07/14/2020.  Of note, he is on multiple medications including psychotropic medications.  He is followed by Dr. Tenny Craw in psychiatry.  He is on alprazolam, 1 mg 3 times daily, trazodone 100 mg at night, generic Cymbalta 60 mg daily, gabapentin 300 mg daily as needed and pantoprazole.  He also takes Jordan and Cogentin.  He was referred to Dr. Kieth Brightly by Dr. Tenny Craw.  He had an appointment with neuropsychology on 11/01/2020.  He had a remote brain MRI without contrast on 08/26/2013 for syncope, memory loss, headaches and gait unsteadiness for 1 year, I reviewed the results: Impression: Unremarkable brain MRI for age. He is scheduled for cognitive testing with Dr. Marvetta Gibbons office on 12/06/2020.   He lives with his wife.  They have 4 biological children and had 1 adopted child. He worked as a Engineer, civil (consulting), he quit working in 2012.  He had blood work through your office which showed an abnormal TSH back in August 2021.  He had repeat blood  testing recently on 11/25/2020 and we were able to request test results from your office, CBC with differential was unremarkable, CMP showed normal findings with BUN of 11, creatinine 1.22, normal AST and ALT, total cholesterol 141, triglycerides 86, HDL 29, LDL 95.  A1c was 5.2, free T4 mildly elevated at 1.87, TSH low at 0.023. He does not a telltale family history of dementia.  Mom lived to be in her 13s and father lived to be in his 40s.  He is not aware of any memory loss in his grandparents.  He has one half sister but has not had any recent contact with her.  He has been working on weight loss.  In the past 1+ year he has lost about 100 pounds.  He used to work as a Engineer, civil (consulting) in [employer]. Epworth sleepiness score is 4 out of 24.    His Past Medical History Is Significant For: Past Medical History:  Diagnosis Date   Acid reflux    Anxiety    Chronic kidney disease    Stage III   Depression    Depression    GERD (gastroesophageal reflux disease)    Hyperlipidemia    Morbid obesity (HCC)    Sleep apnea    Thyroid disease     His Past Surgical History Is Significant For: Past Surgical History:  Procedure Laterality Date   COLONOSCOPY WITH ESOPHAGOGASTRODUODENOSCOPY (EGD)  2010   Dr. Jena Gauss: ulcerative/erosive reflux esophagitis, noncritical schatki's ring, multiple bulbar erosions with normal sb biopsies, colonic diverticulosis, normal TI   COLONOSCOPY WITH PROPOFOL N/A 10/30/2019   Procedure: COLONOSCOPY WITH PROPOFOL;  Surgeon: Corbin Ade, MD;  Location: [facility];  Service: Endoscopy;  Laterality: N/A;  1:30pm   POLYPECTOMY  10/30/2019   Procedure: POLYPECTOMY;  Surgeon: Corbin Ade, MD;  Location: [facility];  Service: Endoscopy;;   TONSILLECTOMY     Upper and Lower GI      His Family History Is Significant For: Family History  Problem Relation Age of Onset   Alcohol abuse Father    Colon cancer Father    COPD Father    Depression Daughter        acute   Sexual  abuse Daughter    Lung disease Mother    COPD Mother    Thyroid disease Mother    Alcohol abuse Paternal Uncle  Colon cancer Daughter 64   ADD / ADHD Neg Hx    Anxiety disorder Neg Hx    OCD Neg Hx    Drug abuse Neg Hx    Bipolar disorder Neg Hx    Dementia Neg Hx    Paranoid behavior Neg Hx    Schizophrenia Neg Hx    Seizures Neg Hx    Physical abuse Neg Hx     His Social History Is Significant For: Social History   Socioeconomic History   Marital status: Married    Spouse name: Not on file   Number of children: Not on file   Years of education: Not on file   Highest education level: Not on file  Occupational History   Not on file  Tobacco Use   Smoking status: Never   Smokeless tobacco: Never  Substance and Sexual Activity   Alcohol use: Not Currently    Comment: occ; update 10/26 "I don't drink anymore"   Drug use: No   Sexual activity: Yes    Birth control/protection: Surgical  Other Topics Concern   Not on file  Social History Narrative   Lives at home with wife   Caffeine: coffee 1 large cup/day   Social Determinants of Health   Financial Resource Strain: Not on file  Food Insecurity: Not on file  Transportation Needs: Not on file  Physical Activity: Not on file  Stress: Not on file  Social Connections: Not on file    His Allergies Are:  No Known Allergies:   His Current Medications Are:  Outpatient Encounter Medications as of 08/03/2021  Medication Sig   ALPRAZolam (XANAX) 1 MG tablet Take 1 tablet (1 mg total) by mouth 4 (four) times daily.   DULoxetine (CYMBALTA) 60 MG capsule Take 1 capsule (60 mg total) by mouth daily.   gabapentin (NEURONTIN) 300 MG capsule Take 300 mg by mouth 3 (three) times daily as needed (pain).   levothyroxine (SYNTHROID) 175 MCG tablet Take 175 mcg by mouth daily before breakfast.    Multiple Vitamin (MULTIVITAMIN WITH MINERALS) TABS tablet Take 1 tablet by mouth daily.   pantoprazole (PROTONIX) 40 MG tablet Take  40 mg by mouth daily.   traZODone (DESYREL) 100 MG tablet TAKE 1 TABLET (100 MG TOTAL) BY MOUTH AT BEDTIME.   potassium chloride (KLOR-CON) 10 MEQ tablet Take 10 mEq by mouth every other day. (Patient not taking: Reported on 08/03/2021)   No facility-administered encounter medications on file as of 08/03/2021.  :  Review of Systems:  Out of a complete 14 point review of systems, all are reviewed and negative with the exception of these symptoms as listed below:  Review of Systems  Neurological:        Pt here with wife for initial PAP follow-up. He has a Personnel officer. He has had difficulty using it lately due to a tooth problem that is being made worse with the mask. He has an upcoming dental appointment.   Objective:  Neurological Exam  Physical Exam Physical Examination:   Vitals:   08/03/21 0954  BP: 127/75  Pulse: 63    General Examination: The patient is a very pleasant 60 y.o. male in no acute distress. He appears well-developed and well-nourished and well groomed.   HEENT: Normocephalic, atraumatic, pupils are equal, round and reactive to light, he has mild bilateral cataracts, extraocular tracking is well-preserved, face is symmetric, no significant facial masking noted, neck is supple with full range of motion.  Hearing is  grossly intact, stable. Speech without dysarthria or voice tremor.  Very few teeth left, tooth decay noted.  Airway examination otherwise stable.  Chest: Clear to auscultation without wheezing, rhonchi or crackles noted.   Heart: S1+S2+0, regular and normal without murmurs, rubs or gallops noted.    Abdomen: Soft, non-tender and non-distended.   Extremities: There is trace edema in the distal lower extremities bilaterally.    Skin: Warm and dry without trophic changes noted..   Musculoskeletal: exam reveals no obvious joint deformities.    Neurologically:  Mental status: The patient is awake, alert and oriented in all 4 spheres. His immediate and  remote memory, attention, language skills and fund of knowledge are mildly impaired, but appear to be stable. He is able to provide his history well today.     On 12/01/2020: MMSE: 24/30, CDT: 4/4, AFT: 8/min.   Speech: He is soft-spoken, affect appears mildly flat.   Cranial nerves II - XII are as described above under HEENT exam.  Motor exam: Normal bulk, strength and tone is noted. There is no tremor, fine motor skills are mildly slow throughout, no resting tremor noted.   Cerebellar testing: No dysmetria or intention tremor. There is no truncal or gait ataxia.  Sensory exam: intact to light touch in the upper and lower extremities.  Gait, station and balance: He stands without difficulty.   He walks without difficulty.  No limp, preserved arm swing noted.   Assessment and plan:    In summary, Greg Kemp is a very pleasant 60 year old male with an underlying medical history of thyroid disease, morbid obesity with a BMI of over 40, OSA, hyperlipidemia, reflux disease, depression, anxiety, and chronic kidney disease, who presents for follow-up consultation of his sleep apnea.  We also did extensive work-up for concerns for memory loss.  His brain MRI in March 2022 did not show any obvious structural abnormalities. Evaluation with neuropsychology was not supportive of a primary underlying neurocognitive disorder.  Evaluation with home sleep testing in March 2022 showed mild obstructive sleep apnea but desaturations were noted into the 80s and at times mid to lower 70s. He was advised to start AutoPap therapy, treatment started August 2022.  He was better with his compliance in the first month but has had a decline in usage because of tooth aches and difficulty with tolerance of the mask.  He does indicate being a mouth breather and has been using a fullface mask, I suggested we continue with this program on a nasal interface.  I placed an order for this, he is agreeable and committed to using his  AutoPap more consistently.  He has an appointment with his dentist in mid November.  He is motivated to continue with treatment as best as possible.  He is advised to follow-up to see one of our nurse practitioners in sleep clinic in 3 to 4 months, sooner if needed.  I placed an order for a mask refit for a nasal interface and also chinstrap if needed.  I answered all the questions today and the patient and his wife are in agreement.I spent 30 minutes in total face-to-face time and in reviewing records during pre-charting, more than 50% of which was spent in counseling and coordination of care, reviewing test results, reviewing medications and treatment regimen and/or in discussing or reviewing the diagnosis of OSA, the prognosis and treatment options. Pertinent laboratory and imaging test results that were available during this visit with the patient were reviewed by me  and considered in my medical decision making (see chart for details).

## 2021-08-04 DIAGNOSIS — Z23 Encounter for immunization: Secondary | ICD-10-CM | POA: Diagnosis not present

## 2021-08-15 ENCOUNTER — Other Ambulatory Visit (HOSPITAL_COMMUNITY): Payer: Self-pay | Admitting: Psychiatry

## 2021-08-16 ENCOUNTER — Other Ambulatory Visit (HOSPITAL_COMMUNITY): Payer: Self-pay | Admitting: Psychiatry

## 2021-08-16 MED ORDER — DULOXETINE HCL 60 MG PO CPEP: 60.0000 mg | ORAL_CAPSULE | Freq: Every day | ORAL | 2 refills | Status: DC

## 2021-08-18 ENCOUNTER — Telehealth (HOSPITAL_COMMUNITY): Payer: Self-pay | Admitting: *Deleted

## 2021-08-18 NOTE — Telephone Encounter (Signed)
Patient wife called and LMOM stating patient wanted her to call to let Dr. Harrington Challenger know that sleep med is not working and would like to change to something else.  Staff called back and an appt was scheduled with patient wife

## 2021-08-19 ENCOUNTER — Encounter (HOSPITAL_COMMUNITY): Payer: Self-pay | Admitting: Psychiatry

## 2021-08-19 ENCOUNTER — Telehealth (INDEPENDENT_AMBULATORY_CARE_PROVIDER_SITE_OTHER): Payer: Medicare HMO | Admitting: Psychiatry

## 2021-08-19 ENCOUNTER — Other Ambulatory Visit: Payer: Self-pay

## 2021-08-19 DIAGNOSIS — F5105 Insomnia due to other mental disorder: Secondary | ICD-10-CM | POA: Diagnosis not present

## 2021-08-19 DIAGNOSIS — F419 Anxiety disorder, unspecified: Secondary | ICD-10-CM | POA: Diagnosis not present

## 2021-08-19 DIAGNOSIS — F332 Major depressive disorder, recurrent severe without psychotic features: Secondary | ICD-10-CM

## 2021-08-19 MED ORDER — DULOXETINE HCL 60 MG PO CPEP: 60.0000 mg | ORAL_CAPSULE | Freq: Every day | ORAL | 2 refills | Status: DC

## 2021-08-19 MED ORDER — ALPRAZOLAM 1 MG PO TABS: ORAL_TABLET | ORAL | 2 refills | Status: DC

## 2021-08-19 NOTE — Progress Notes (Signed)
Virtual Visit via Video Note  I connected with Greg Kemp on 08/19/21 at  9:20 AM EST by a video enabled telemedicine application and verified that I am speaking with the correct person using two identifiers.  Location: Patient: home Provider: home office   I discussed the limitations of evaluation and management by telemedicine and the availability of in person appointments. The patient expressed understanding and agreed to proceed.    I discussed the assessment and treatment plan with the patient. The patient was provided an opportunity to ask questions and all were answered. The patient agreed with the plan and demonstrated an understanding of the instructions.   The patient was advised to call back or seek an in-person evaluation if the symptoms worsen or if the condition fails to improve as anticipated.  I provided 14 minutes of non-face-to-face time during this encounter.   [name], MD  [facility] MD/PA/NP OP Progress Note  08/19/2021 9:38 AM Greg Kemp  MRN:  [id number]  Chief Complaint:  Chief Complaint   Anxiety; Depression; Follow-up    HPI: This patient is a 60 year old white male who lives with his wife in [place].  He is on disability.  The patient returns after 2 months as a work in today.  He states that in general he has been feeling better his mood has been good and he has been staying active doing a lot of projects around the house.  However for the past 2 weeks he has had significant problems sleeping.  He will sleep for 2 to 3 hours then wake up and cannot go back to sleep.  He was put on his CPAP machine recently but he states that these problems predate the CPAP.  He denies any increased caffeine intake.  The only medication change has been a decrease in his Synthroid because his thyroid hormone was too high by his report.  I cannot see his primary care physician's lab values.  He denies any increase in caffeine use.  He denies significant anxiety or  worry.  Currently he takes trazodone 100 mg at bedtime and I suggested we go to 100 at maximum 300 mg to help him with sleep. Visit Diagnosis:    ICD-10-CM   1. Major depressive disorder, recurrent, severe without psychotic features ([place])  F33.2     2. Anxiety  F41.9     3. Insomnia due to mental disorder  F51.05       Past Psychiatric History: Several past hospitalizations but none since 2012  Past Medical History:  Past Medical History:  Diagnosis Date   Acid reflux    Anxiety    Chronic kidney disease    Stage III   Depression    Depression    GERD (gastroesophageal reflux disease)    Hyperlipidemia    Morbid obesity ([place])    Sleep apnea    Thyroid disease     Past Surgical History:  Procedure Laterality Date   COLONOSCOPY WITH ESOPHAGOGASTRODUODENOSCOPY (EGD)  2010   Dr. Gala Romney: ulcerative/erosive reflux esophagitis, noncritical schatki's ring, multiple bulbar erosions with normal sb biopsies, colonic diverticulosis, normal TI   COLONOSCOPY WITH PROPOFOL N/A 10/30/2019   Procedure: COLONOSCOPY WITH PROPOFOL;  Surgeon: Daneil Dolin, MD;  [facility];  Service: Endoscopy;  Laterality: N/A;  1:30pm   POLYPECTOMY  10/30/2019   Procedure: POLYPECTOMY;  Surgeon: Daneil Dolin, MD;  [facility];  Service: Endoscopy;;   TONSILLECTOMY     Upper  and Lower GI      Family Psychiatric History: see below  Family History:  Family History  Problem Relation Age of Onset   Alcohol abuse Father    Colon cancer Father    COPD Father    Depression Daughter        acute   Sexual abuse Daughter    Lung disease Mother    COPD Mother    Thyroid disease Mother    Alcohol abuse Paternal Uncle    Colon cancer Daughter 55   ADD / ADHD Neg Hx    Anxiety disorder Neg Hx    OCD Neg Hx    Drug abuse Neg Hx    Bipolar disorder Neg Hx    Dementia Neg Hx    Paranoid behavior Neg Hx    Schizophrenia Neg Hx    Seizures Neg Hx    Physical abuse Neg Hx      Social History:  Social History   Socioeconomic History   Marital status: Married    Spouse name: Not on file   Number of children: Not on file   Years of education: Not on file   Highest education level: Not on file  Occupational History   Not on file  Tobacco Use   Smoking status: Never   Smokeless tobacco: Never  Substance and Sexual Activity   Alcohol use: Not Currently    Comment: occ; update 10/26 "I don't drink anymore"   Drug use: No   Sexual activity: Yes    Birth control/protection: Surgical  Other Topics Concern   Not on file  Social History Narrative   Lives at home with wife   Caffeine: coffee 1 large cup/day   Social Determinants of Health   Financial Resource Strain: Not on file  Food Insecurity: Not on file  Transportation Needs: Not on file  Physical Activity: Not on file  Stress: Not on file  Social Connections: Not on file    Allergies: No Known Allergies  Metabolic Disorder Labs: No results found for: HGBA1C, MPG No results found for: PROLACTIN Lab Results  Component Value Date   CHOL  06/16/2010    166        ATP III CLASSIFICATION:  <200     mg/dL   Desirable  200-239  mg/dL   Borderline High  >=240    mg/dL   High          TRIG 166 (H) 06/16/2010   HDL 29 (L) 06/16/2010   CHOLHDL 5.7 06/16/2010   VLDL 33 06/16/2010   LDLCALC (H) 06/16/2010    104        Total Cholesterol/HDL:CHD Risk Coronary Heart Disease Risk Table                     Men   Women  1/2 Average Risk   3.4   3.3  Average Risk       5.0   4.4  2 X Average Risk   9.6   7.1  3 X Average Risk  23.4   11.0        Use the calculated Patient Ratio above and the CHD Risk Table to determine the patient's CHD Risk.        ATP III CLASSIFICATION (LDL):  <100     mg/dL   Optimal  100-129  mg/dL   Near or Above  Optimal  130-159  mg/dL   Borderline  160-189  mg/dL   High  >190     mg/dL   Very High   Lab Results  Component Value Date   TSH  11.645 (H) 12/14/2010   TSH 0.264 (L) 06/16/2010    Therapeutic Level Labs: No results found for: LITHIUM No results found for: VALPROATE No components found for:  CBMZ  Current Medications: Current Outpatient Medications  Medication Sig Dispense Refill   ALPRAZolam (XANAX) 1 MG tablet TAKE 1 TABLET FOUR TIMES DAILY 120 tablet 2   DULoxetine (CYMBALTA) 60 MG capsule Take 1 capsule (60 mg total) by mouth daily. 90 capsule 2   gabapentin (NEURONTIN) 300 MG capsule Take 300 mg by mouth 3 (three) times daily as needed (pain).     levothyroxine (SYNTHROID) 175 MCG tablet Take 175 mcg by mouth daily before breakfast.      Multiple Vitamin (MULTIVITAMIN WITH MINERALS) TABS tablet Take 1 tablet by mouth daily.     pantoprazole (PROTONIX) 40 MG tablet Take 40 mg by mouth daily.     potassium chloride (KLOR-CON) 10 MEQ tablet Take 10 mEq by mouth every other day. (Patient not taking: Reported on 08/03/2021)     traZODone (DESYREL) 100 MG tablet TAKE 1 TABLET (100 MG TOTAL) BY MOUTH AT BEDTIME. 90 tablet 2   No current facility-administered medications for this visit.     Musculoskeletal: Strength & Muscle Tone: within normal limits Gait & Station: normal Patient leans: N/A  Psychiatric Specialty Exam: Review of Systems  Psychiatric/Behavioral:  Positive for sleep disturbance.   All other systems reviewed and are negative.  There were no vitals taken for this visit.There is no height or weight on file to calculate BMI.  General Appearance: Casual and Fairly Groomed  Eye Contact:  Good  Speech:  Clear and Coherent  Volume:  Normal  Mood:  Euthymic  Affect:  Congruent  Thought Process:  Goal Directed  Orientation:  Full (Time, Place, and Person)  Thought Content: WDL   Suicidal Thoughts:  No  Homicidal Thoughts:  No  Memory:  Immediate;   Good Recent;   Good Remote;   Fair  Judgement:  Good  Insight:  Good  Psychomotor Activity:  Normal  Concentration:  Concentration: Good and  Attention Span: Good  Recall:  Good  Fund of Knowledge: Good  Language: Good  Akathisia:  No  Handed:  Right  AIMS (if indicated): not done  Assets:  Communication Skills Desire for Improvement Resilience Social Support Talents/Skills  ADL's:  Intact  Cognition: WNL  Sleep:  Poor   Screenings: Mini-Mental    Flowsheet Row Office Visit from 12/01/2020 in [facility]  Total Score (max 30 points ) 24      PHQ2-9    Flowsheet Row Video Visit from 08/19/2021 in [facility] Video Visit from 05/16/2021 in [place] [facility] Video Visit from 01/14/2021 in [facility] [facility]  PHQ-2 Total Score 0 0 0      Flowsheet Row Video Visit from 08/19/2021 in [facility] Video Visit from 05/16/2021 in [facility] Video Visit from 01/14/2021 in [place] No Risk No Risk No Risk        Assessment and Plan: This patient is a 60 year old male with a history of posttraumatic stress disorder and anxiety.  He denies being depressed or significantly anxious but  nevertheless is having a lot of trouble with sleep.  He will increase trazodone to 200-hour at maximum 300 mg at bedtime for sleep.  He will continue Cymbalta 60 mg daily for depression and Xanax 1 mg up to 4 times daily for anxiety.  He will return to see me in 4 weeks but call with in about a week to let me know how his sleep is going.   [name], MD 08/19/2021, 9:38 AM

## 2021-08-30 DIAGNOSIS — Z0001 Encounter for general adult medical examination with abnormal findings: Secondary | ICD-10-CM | POA: Diagnosis not present

## 2021-09-06 ENCOUNTER — Other Ambulatory Visit (HOSPITAL_COMMUNITY): Payer: Self-pay | Admitting: Family Medicine

## 2021-09-06 DIAGNOSIS — M542 Cervicalgia: Secondary | ICD-10-CM

## 2021-09-07 DIAGNOSIS — E782 Mixed hyperlipidemia: Secondary | ICD-10-CM | POA: Diagnosis not present

## 2021-09-07 DIAGNOSIS — I10 Essential (primary) hypertension: Secondary | ICD-10-CM | POA: Diagnosis not present

## 2021-09-09 ENCOUNTER — Other Ambulatory Visit: Payer: Self-pay

## 2021-09-09 ENCOUNTER — Ambulatory Visit (HOSPITAL_COMMUNITY)
Admission: RE | Admit: 2021-09-09 | Discharge: 2021-09-09 | Disposition: A | Discharge status: Home / Self Care | Payer: Medicare HMO | Source: Ambulatory Visit | Attending: Family Medicine | Admitting: Family Medicine

## 2021-09-09 DIAGNOSIS — M542 Cervicalgia: Secondary | ICD-10-CM | POA: Insufficient documentation

## 2021-10-07 DIAGNOSIS — E782 Mixed hyperlipidemia: Secondary | ICD-10-CM | POA: Diagnosis not present

## 2021-10-07 DIAGNOSIS — I10 Essential (primary) hypertension: Secondary | ICD-10-CM | POA: Diagnosis not present

## 2021-10-17 DIAGNOSIS — R030 Elevated blood-pressure reading, without diagnosis of hypertension: Secondary | ICD-10-CM | POA: Diagnosis not present

## 2021-10-17 DIAGNOSIS — E1122 Type 2 diabetes mellitus with diabetic chronic kidney disease: Secondary | ICD-10-CM | POA: Diagnosis not present

## 2021-11-01 DIAGNOSIS — R252 Cramp and spasm: Secondary | ICD-10-CM | POA: Diagnosis not present

## 2021-11-01 DIAGNOSIS — R030 Elevated blood-pressure reading, without diagnosis of hypertension: Secondary | ICD-10-CM | POA: Diagnosis not present

## 2021-11-15 DIAGNOSIS — R030 Elevated blood-pressure reading, without diagnosis of hypertension: Secondary | ICD-10-CM | POA: Diagnosis not present

## 2021-11-23 DIAGNOSIS — Z6841 Body Mass Index (BMI) 40.0 and over, adult: Secondary | ICD-10-CM | POA: Diagnosis not present

## 2021-11-23 DIAGNOSIS — M47812 Spondylosis without myelopathy or radiculopathy, cervical region: Secondary | ICD-10-CM | POA: Diagnosis not present

## 2021-11-23 DIAGNOSIS — I10 Essential (primary) hypertension: Secondary | ICD-10-CM | POA: Diagnosis not present

## 2021-11-24 ENCOUNTER — Other Ambulatory Visit: Payer: Self-pay | Admitting: Neurological Surgery

## 2021-11-24 DIAGNOSIS — M47812 Spondylosis without myelopathy or radiculopathy, cervical region: Secondary | ICD-10-CM

## 2021-12-05 ENCOUNTER — Encounter: Payer: Self-pay | Admitting: Adult Health

## 2021-12-05 ENCOUNTER — Ambulatory Visit (INDEPENDENT_AMBULATORY_CARE_PROVIDER_SITE_OTHER): Payer: Medicare HMO | Admitting: Adult Health

## 2021-12-05 VITALS — BP 132/74 | HR 66 | Ht 71.0 in | Wt 296.2 lb

## 2021-12-05 DIAGNOSIS — G4733 Obstructive sleep apnea (adult) (pediatric): Secondary | ICD-10-CM

## 2021-12-05 DIAGNOSIS — Z9989 Dependence on other enabling machines and devices: Secondary | ICD-10-CM | POA: Diagnosis not present

## 2021-12-05 NOTE — Progress Notes (Signed)
PATIENT: Greg Kemp DOB: April 13, 1961  REASON FOR VISIT: follow up HISTORY FROM: patient PRIMARY NEUROLOGIST: Dr. Rexene Alberts  HISTORY OF PRESENT ILLNESS: Today 12/05/21:  Greg Kemp is a 61 year old male with a history of obstructive sleep apnea on CPAP.  He returns today for follow-up.  His download indicates that he uses machine 22 out of 30 days for compliance of 73.3% he uses machine greater than 4 hours 18 out of 30 days for compliance of 60%.  On average he uses his machine 3 hours and 59 minutes.  The patient states that he still struggles with insomnia.  He reports that the time that he uses the CPAP machine is the time that he actually sleeps.  He currently is seeing Dr. [name] for chronic insomnia.  His residual AHI is 1 on 6 to 12 cm of water.  The patient states that he is currently on trazodone to help with his sleep.  Returns today for an evaluation  HISTORY 08/03/2021: I reviewed his AutoPap compliance data from 05/30/2021 through 06/29/2021, which is a total of 31 days, during which time he used his machine 21 days with percent use days greater than 4 hours at 45.2%, indicating suboptimal compliance, average usage for days on treatment of 4 hours and 21 minutes.  Residual AHI at goal at 1/h, average leak 15.4 L/min, pressure range of 6 to 12 cm.Marland Kitchen  He reports struggling with the AutoPap in particular because of tooth pain.  He has to get his teeth pulled and eventually will pursue dentures.  Currently, the full facemask is pushing into the front teeth on the bottom and resulting in pain and even his lip being cut once.  He is motivated to continue with his AutoPap but is currently struggling with tolerance.  He has an appointment with the dentist in mid November, he had to reschedule the appointment because of his wife's illness.  He endorses stress and difficulty sleeping, continues to take trazodone at night for sleep and also takes 5 to 10 mg of melatonin currently at night.   The  patient's allergies, current medications, family history, past medical history, past social history, past surgical history and problem list were reviewed and updated as appropriate.   REVIEW OF SYSTEMS: Out of a complete 14 system review of symptoms, the patient complains only of the following symptoms, and all other reviewed systems are negative.   ESS 2  ALLERGIES: No Known Allergies  HOME MEDICATIONS: Outpatient Medications Prior to Visit  Medication Sig Dispense Refill   ALPRAZolam (XANAX) 1 MG tablet TAKE 1 TABLET FOUR TIMES DAILY 120 tablet 2   cyclobenzaprine (FLEXERIL) 10 MG tablet Take 10 mg by mouth 2 (two) times daily as needed.     DULoxetine (CYMBALTA) 60 MG capsule Take 1 capsule (60 mg total) by mouth daily. 90 capsule 2   gabapentin (NEURONTIN) 300 MG capsule Take 300 mg by mouth 3 (three) times daily as needed (pain).     levothyroxine (SYNTHROID) 150 MCG tablet Take 150 mcg by mouth daily.     Multiple Vitamin (MULTIVITAMIN WITH MINERALS) TABS tablet Take 1 tablet by mouth daily.     pantoprazole (PROTONIX) 40 MG tablet Take 40 mg by mouth daily.     traZODone (DESYREL) 100 MG tablet TAKE 1 TABLET (100 MG TOTAL) BY MOUTH AT BEDTIME. 90 tablet 2   olmesartan (BENICAR) 40 MG tablet Take 40 mg by mouth daily.     potassium chloride (KLOR-CON) 10 MEQ  tablet Take 10 mEq by mouth every other day. (Patient not taking: Reported on 08/03/2021)     levothyroxine (SYNTHROID) 175 MCG tablet Take 175 mcg by mouth daily before breakfast.      No facility-administered medications prior to visit.    PAST MEDICAL HISTORY: Past Medical History:  Diagnosis Date   Acid reflux    Anxiety    Cervical spinal stenosis    Chronic kidney disease    Stage III   Depression    Depression    GERD (gastroesophageal reflux disease)    Hyperlipidemia    Morbid obesity ([place])    Sleep apnea    Thyroid disease     PAST SURGICAL HISTORY: Past Surgical History:  Procedure Laterality Date    COLONOSCOPY WITH ESOPHAGOGASTRODUODENOSCOPY (EGD)  2010   Dr. Gala Romney: ulcerative/erosive reflux esophagitis, noncritical schatki's ring, multiple bulbar erosions with normal sb biopsies, colonic diverticulosis, normal TI   COLONOSCOPY WITH PROPOFOL N/A 10/30/2019   Procedure: COLONOSCOPY WITH PROPOFOL;  Surgeon: Daneil Dolin, MD;  [facility];  Service: Endoscopy;  Laterality: N/A;  1:30pm   POLYPECTOMY  10/30/2019   Procedure: POLYPECTOMY;  Surgeon: Daneil Dolin, MD;  [facility];  Service: Endoscopy;;   TONSILLECTOMY     Upper and Lower GI      FAMILY HISTORY: Family History  Problem Relation Age of Onset   Alcohol abuse Father    Colon cancer Father    COPD Father    Depression Daughter        acute   Sexual abuse Daughter    Lung disease Mother    COPD Mother    Thyroid disease Mother    Alcohol abuse Paternal Uncle    Colon cancer Daughter 60   ADD / ADHD Neg Hx    Anxiety disorder Neg Hx    OCD Neg Hx    Drug abuse Neg Hx    Bipolar disorder Neg Hx    Dementia Neg Hx    Paranoid behavior Neg Hx    Schizophrenia Neg Hx    Seizures Neg Hx    Physical abuse Neg Hx     SOCIAL HISTORY: Social History   Socioeconomic History   Marital status: Married    Spouse name: Not on file   Number of children: Not on file   Years of education: Not on file   Highest education level: Not on file  Occupational History   Not on file  Tobacco Use   Smoking status: Never   Smokeless tobacco: Never  Substance and Sexual Activity   Alcohol use: Not Currently    Comment: occ; update 10/26 "I don't drink anymore"   Drug use: No   Sexual activity: Yes    Birth control/protection: Surgical  Other Topics Concern   Not on file  Social History Narrative   Lives at home with wife   Caffeine: coffee 1 large cup/day   Social Determinants of Health   Financial Resource Strain: Not on file  Food Insecurity: Not on file  Transportation Needs: Not on  file  Physical Activity: Not on file  Stress: Not on file  Social Connections: Not on file  Intimate Partner Violence: Not on file      PHYSICAL EXAM  Vitals:   12/05/21 1115  BP: 132/74  Pulse: 66  Weight: 296 lb 3.2 oz (134.4 kg)  Height: 5\' 11"  (1.803 m)   Body mass index is 41.31 kg/m.  Generalized: Well  developed, in no acute distress  Chest: Lungs clear to auscultation bilaterally  Neurological examination  Mentation: Alert oriented to time, place, history taking. Follows all commands speech and language fluent Cranial nerve II-XII: Extraocular movements were full, visual field were full on confrontational test Head turning and shoulder shrug  were normal and symmetric. Motor: The motor testing reveals 5 over 5 strength of all 4 extremities. Good symmetric motor tone is noted throughout.  Sensory: Sensory testing is intact to soft touch on all 4 extremities. No evidence of extinction is noted.  Gait and station: Gait is normal.    DIAGNOSTIC DATA (LABS, IMAGING, TESTING) - I reviewed patient records, labs, notes, testing and imaging myself where available.  Lab Results  Component Value Date   WBC 10.2 12/14/2010   HGB 17.4 (H) 12/14/2010   HCT 49.8 12/14/2010   MCV 87.7 12/14/2010   PLT 169 12/14/2010      Component Value Date/Time   NA 137 10/28/2019 1047   K 3.6 10/28/2019 1047   CL 104 10/28/2019 1047   CO2 27 10/28/2019 1047   GLUCOSE 108 (H) 10/28/2019 1047   BUN 8 10/28/2019 1047   CREATININE 1.25 (H) 10/28/2019 1047   CALCIUM 8.3 (L) 10/28/2019 1047   PROT 7.3 12/14/2010 2106   ALBUMIN 4.0 12/14/2010 2106   AST 33 12/14/2010 2106   ALT 31 12/14/2010 2106   ALKPHOS 77 12/14/2010 2106   BILITOT 0.9 12/14/2010 2106   GFRNONAA >60 10/28/2019 1047   GFRAA >60 10/28/2019 1047   Lab Results  Component Value Date   CHOL  06/16/2010    166        ATP III CLASSIFICATION:  <200     mg/dL   Desirable  200-239  mg/dL   Borderline High  >=240     mg/dL   High          HDL 29 (L) 06/16/2010   LDLCALC (H) 06/16/2010    104        Total Cholesterol/HDL:CHD Risk Coronary Heart Disease Risk Table                     Men   Women  1/2 Average Risk   3.4   3.3  Average Risk       5.0   4.4  2 X Average Risk   9.6   7.1  3 X Average Risk  23.4   11.0        Use the calculated Patient Ratio above and the CHD Risk Table to determine the patient's CHD Risk.        ATP III CLASSIFICATION (LDL):  <100     mg/dL   Optimal  100-129  mg/dL   Near or Above                    Optimal  130-159  mg/dL   Borderline  160-189  mg/dL   High  >190     mg/dL   Very High   TRIG 166 (H) 06/16/2010   CHOLHDL 5.7 06/16/2010   No results found for: HGBA1C No results found for: VITAMINB12 Lab Results  Component Value Date   TSH 11.645 (H) 12/14/2010      ASSESSMENT AND PLAN 61 y.o. year old male  has a past medical history of Acid reflux, Anxiety, Cervical spinal stenosis, Chronic kidney disease, Depression, Depression, GERD (gastroesophageal reflux disease), Hyperlipidemia, Morbid obesity ([place]), Sleep apnea, and Thyroid disease.  here with:  OSA on CPAP  - CPAP compliance good - Good treatment of AHI  - Encourage patient to use CPAP nightly and > 4 hours each night - F/U in 1 year or sooner if needed    Ward Givens, MSN, NP-C 12/05/2021, 11:46 AM [facility] [address] [place], [place] [address] [phone]

## 2021-12-05 NOTE — Patient Instructions (Signed)
Continue using CPAP nightly and greater than 4 hours each night Can ask Dr. Harrington Challenger about Belsomra?  If your symptoms worsen or you develop new symptoms please let us know.

## 2021-12-07 DIAGNOSIS — R030 Elevated blood-pressure reading, without diagnosis of hypertension: Secondary | ICD-10-CM | POA: Diagnosis not present

## 2021-12-07 DIAGNOSIS — E782 Mixed hyperlipidemia: Secondary | ICD-10-CM | POA: Diagnosis not present

## 2021-12-07 DIAGNOSIS — D509 Iron deficiency anemia, unspecified: Secondary | ICD-10-CM | POA: Diagnosis not present

## 2021-12-10 ENCOUNTER — Other Ambulatory Visit: Payer: Self-pay

## 2021-12-10 ENCOUNTER — Ambulatory Visit
Admission: RE | Admit: 2021-12-10 | Discharge: 2021-12-10 | Disposition: A | Discharge status: Home / Self Care | Payer: Medicare HMO | Source: Ambulatory Visit | Attending: Neurological Surgery | Admitting: Neurological Surgery

## 2021-12-10 DIAGNOSIS — M47812 Spondylosis without myelopathy or radiculopathy, cervical region: Secondary | ICD-10-CM

## 2021-12-10 DIAGNOSIS — M4802 Spinal stenosis, cervical region: Secondary | ICD-10-CM | POA: Diagnosis not present

## 2021-12-10 DIAGNOSIS — M2578 Osteophyte, vertebrae: Secondary | ICD-10-CM | POA: Diagnosis not present

## 2021-12-10 DIAGNOSIS — R200 Anesthesia of skin: Secondary | ICD-10-CM | POA: Diagnosis not present

## 2021-12-12 DIAGNOSIS — E876 Hypokalemia: Secondary | ICD-10-CM | POA: Diagnosis not present

## 2021-12-12 DIAGNOSIS — E039 Hypothyroidism, unspecified: Secondary | ICD-10-CM | POA: Diagnosis not present

## 2021-12-12 DIAGNOSIS — F3181 Bipolar II disorder: Secondary | ICD-10-CM | POA: Diagnosis not present

## 2021-12-12 DIAGNOSIS — G4700 Insomnia, unspecified: Secondary | ICD-10-CM | POA: Diagnosis not present

## 2021-12-12 DIAGNOSIS — K219 Gastro-esophageal reflux disease without esophagitis: Secondary | ICD-10-CM | POA: Diagnosis not present

## 2021-12-12 DIAGNOSIS — F411 Generalized anxiety disorder: Secondary | ICD-10-CM | POA: Diagnosis not present

## 2021-12-12 DIAGNOSIS — N1831 Chronic kidney disease, stage 3a: Secondary | ICD-10-CM | POA: Diagnosis not present

## 2021-12-12 DIAGNOSIS — M545 Low back pain, unspecified: Secondary | ICD-10-CM | POA: Diagnosis not present

## 2021-12-12 DIAGNOSIS — E782 Mixed hyperlipidemia: Secondary | ICD-10-CM | POA: Diagnosis not present

## 2021-12-13 ENCOUNTER — Encounter: Payer: Self-pay | Admitting: Internal Medicine

## 2021-12-19 ENCOUNTER — Telehealth (INDEPENDENT_AMBULATORY_CARE_PROVIDER_SITE_OTHER): Payer: Self-pay | Admitting: Psychiatry

## 2021-12-19 ENCOUNTER — Telehealth (HOSPITAL_COMMUNITY): Payer: Medicare HMO | Admitting: Psychiatry

## 2021-12-19 ENCOUNTER — Other Ambulatory Visit: Payer: Self-pay

## 2021-12-19 ENCOUNTER — Encounter (HOSPITAL_COMMUNITY): Payer: Self-pay | Admitting: Psychiatry

## 2021-12-19 DIAGNOSIS — F332 Major depressive disorder, recurrent severe without psychotic features: Secondary | ICD-10-CM

## 2021-12-19 DIAGNOSIS — F419 Anxiety disorder, unspecified: Secondary | ICD-10-CM

## 2021-12-19 DIAGNOSIS — F5105 Insomnia due to other mental disorder: Secondary | ICD-10-CM

## 2021-12-19 MED ORDER — ALPRAZOLAM 1 MG PO TABS: 1.0000 mg | ORAL_TABLET | Freq: Two times a day (BID) | ORAL | 2 refills | Status: DC | PRN

## 2021-12-19 MED ORDER — BELSOMRA 20 MG PO TABS: 20.0000 mg | ORAL_TABLET | Freq: Every day | ORAL | 2 refills | Status: DC

## 2021-12-19 MED ORDER — DULOXETINE HCL 60 MG PO CPEP: 60.0000 mg | ORAL_CAPSULE | Freq: Every day | ORAL | 2 refills | Status: DC

## 2021-12-19 NOTE — Progress Notes (Signed)
Virtual Visit via Video Note  I connected with Greg Kemp on 12/19/21 at  4:20 PM EDT by a video enabled telemedicine application and verified that I am speaking with the correct person using two identifiers.  Location: Patient: home Provider: office   I discussed the limitations of evaluation and management by telemedicine and the availability of in person appointments. The patient expressed understanding and agreed to proceed.      I discussed the assessment and treatment plan with the patient. The patient was provided an opportunity to ask questions and all were answered. The patient agreed with the plan and demonstrated an understanding of the instructions.   The patient was advised to call back or seek an in-person evaluation if the symptoms worsen or if the condition fails to improve as anticipated.  I provided 15 minutes of non-face-to-face time during this encounter.   [name], MD  [facility] MD/PA/NP OP Progress Note  12/19/2021 4:41 PM Greg Kemp  MRN:  [id number]  Chief Complaint:  Chief Complaint  Patient presents with   Anxiety   Depression   Follow-up   HPI: This patient is a 61 year old white male who lives with his wife in [place].  He is on disability.  The patient returns for follow-up after 4 months.  He states in general he is doing very well.  He is staying busy and active.  He has been traveling to visit family members.  He still has trouble sleeping however.  Last time we restarted trazodone for sleep.  It is up to 200 mg a day.  He sleeps for 4 to 6 hours then wakes up and cannot get back to sleep.  He also complains of a lot of leg cramps at night which need to be addressed through his PCP.  He is already tried Ambien and Lunesta with poor result.  The trazodone is no longer working.  I suggested a change to Belsomra and he is willing to give this a try.  He is really not using much of the Xanax so we will cut this back down to 1 mg twice daily  as needed.  He continues on Cymbalta and asked to cut it down but I do not think this is a good idea given his severity of depression in the past Visit Diagnosis:    ICD-10-CM   1. Major depressive disorder, recurrent, severe without psychotic features ([place])  F33.2     2. Anxiety  F41.9     3. Insomnia due to mental disorder  F51.05       Past Psychiatric History: Several hospitalizations but none since 2012  Past Medical History:  Past Medical History:  Diagnosis Date   Acid reflux    Anxiety    Cervical spinal stenosis    Chronic kidney disease    Stage III   Depression    Depression    GERD (gastroesophageal reflux disease)    Hyperlipidemia    Morbid obesity ([place])    Sleep apnea    Thyroid disease     Past Surgical History:  Procedure Laterality Date   COLONOSCOPY WITH ESOPHAGOGASTRODUODENOSCOPY (EGD)  2010   Dr. Gala Romney: ulcerative/erosive reflux esophagitis, noncritical schatki's ring, multiple bulbar erosions with normal sb biopsies, colonic diverticulosis, normal TI   COLONOSCOPY WITH PROPOFOL N/A 10/30/2019   Procedure: COLONOSCOPY WITH PROPOFOL;  Surgeon: Daneil Dolin, MD;  [facility];  Service: Endoscopy;  Laterality: N/A;  1:30pm   POLYPECTOMY  10/30/2019  Procedure: POLYPECTOMY;  Surgeon: Daneil Dolin, MD;  [facility];  Service: Endoscopy;;   TONSILLECTOMY     Upper and Lower GI      Family Psychiatric History: see below  Family History:  Family History  Problem Relation Age of Onset   Alcohol abuse Father    Colon cancer Father    COPD Father    Depression Daughter        acute   Sexual abuse Daughter    Lung disease Mother    COPD Mother    Thyroid disease Mother    Alcohol abuse Paternal Uncle    Colon cancer Daughter 19   ADD / ADHD Neg Hx    Anxiety disorder Neg Hx    OCD Neg Hx    Drug abuse Neg Hx    Bipolar disorder Neg Hx    Dementia Neg Hx    Paranoid behavior Neg Hx    Schizophrenia Neg Hx     Seizures Neg Hx    Physical abuse Neg Hx     Social History:  Social History   Socioeconomic History   Marital status: Married    Spouse name: Not on file   Number of children: Not on file   Years of education: Not on file   Highest education level: Not on file  Occupational History   Not on file  Tobacco Use   Smoking status: Never   Smokeless tobacco: Never  Substance and Sexual Activity   Alcohol use: Not Currently    Comment: occ; update 10/26 "I don't drink anymore"   Drug use: No   Sexual activity: Yes    Birth control/protection: Surgical  Other Topics Concern   Not on file  Social History Narrative   Lives at home with wife   Caffeine: coffee 1 large cup/day   Social Determinants of Health   Financial Resource Strain: Not on file  Food Insecurity: Not on file  Transportation Needs: Not on file  Physical Activity: Not on file  Stress: Not on file  Social Connections: Not on file    Allergies: No Known Allergies  Metabolic Disorder Labs: No results found for: HGBA1C, MPG No results found for: PROLACTIN Lab Results  Component Value Date   CHOL  06/16/2010    166        ATP III CLASSIFICATION:  <200     mg/dL   Desirable  200-239  mg/dL   Borderline High  >=240    mg/dL   High          TRIG 166 (H) 06/16/2010   HDL 29 (L) 06/16/2010   CHOLHDL 5.7 06/16/2010   VLDL 33 06/16/2010   LDLCALC (H) 06/16/2010    104        Total Cholesterol/HDL:CHD Risk Coronary Heart Disease Risk Table                     Men   Women  1/2 Average Risk   3.4   3.3  Average Risk       5.0   4.4  2 X Average Risk   9.6   7.1  3 X Average Risk  23.4   11.0        Use the calculated Patient Ratio above and the CHD Risk Table to determine the patient's CHD Risk.        ATP III CLASSIFICATION (LDL):  <100     mg/dL   Optimal  100-129  mg/dL   Near or Above                    Optimal  130-159  mg/dL   Borderline  160-189  mg/dL   High  >190     mg/dL   Very High    Lab Results  Component Value Date   TSH 11.645 (H) 12/14/2010   TSH 0.264 (L) 06/16/2010    Therapeutic Level Labs: No results found for: LITHIUM No results found for: VALPROATE No components found for:  CBMZ  Current Medications: Current Outpatient Medications  Medication Sig Dispense Refill   Suvorexant (BELSOMRA) 20 MG TABS Take 20 mg by mouth at bedtime. 30 tablet 2   ALPRAZolam (XANAX) 1 MG tablet Take 1 tablet (1 mg total) by mouth 2 (two) times daily as needed for anxiety. TAKE 1 TABLET FOUR TIMES DAILY 60 tablet 2   cyclobenzaprine (FLEXERIL) 10 MG tablet Take 10 mg by mouth 2 (two) times daily as needed.     DULoxetine (CYMBALTA) 60 MG capsule Take 1 capsule (60 mg total) by mouth daily. 90 capsule 2   gabapentin (NEURONTIN) 300 MG capsule Take 300 mg by mouth 3 (three) times daily as needed (pain).     levothyroxine (SYNTHROID) 150 MCG tablet Take 150 mcg by mouth daily.     Multiple Vitamin (MULTIVITAMIN WITH MINERALS) TABS tablet Take 1 tablet by mouth daily.     olmesartan (BENICAR) 40 MG tablet Take 40 mg by mouth daily.     pantoprazole (PROTONIX) 40 MG tablet Take 40 mg by mouth daily.     No current facility-administered medications for this visit.     Musculoskeletal: Strength & Muscle Tone: within normal limits Gait & Station: normal Patient leans: N/A  Psychiatric Specialty Exam: Review of Systems  Psychiatric/Behavioral:  Positive for sleep disturbance.   All other systems reviewed and are negative.  There were no vitals taken for this visit.There is no height or weight on file to calculate BMI.  General Appearance: Casual and Fairly Groomed  Eye Contact:  Good  Speech:  Clear and Coherent  Volume:  Normal  Mood:  Euthymic  Affect:  Appropriate and Congruent  Thought Process:  Goal Directed  Orientation:  Full (Time, Place, and Person)  Thought Content: WDL   Suicidal Thoughts:  No  Homicidal Thoughts:  No  Memory:  Immediate;   Good Recent;    Good Remote;   Good  Judgement:  Good  Insight:  Fair  Psychomotor Activity:  Normal  Concentration:  Concentration: Good and Attention Span: Good  Recall:  Good  Fund of Knowledge: Good  Language: Good  Akathisia:  No  Handed:  Right  AIMS (if indicated): not done  Assets:  Communication Skills Desire for Improvement Resilience Social Support Talents/Skills  ADL's:  Intact  Cognition: WNL  Sleep:  Poor   Screenings: Mini-Mental    [place] Office Visit from 12/01/2020 in [facility]  Total Score (max 30 points ) 24      PHQ2-9    Flowsheet Row Video Visit from 12/19/2021 in [facility] Video Visit from 08/19/2021 in [facility] Video Visit from 05/16/2021 in [facility] Video Visit from 01/14/2021 in [facility]  PHQ-2 Total Score 0 0 0 0      Flowsheet Row Video Visit from 12/19/2021 in [facility] Video Visit from  08/19/2021 in [place] Video Visit from 05/16/2021 in [facility]  C-SSRS RISK CATEGORY No Risk No Risk No Risk        Assessment and Plan: This patient is a 61 year old male with a history of PTSD and anxiety.  He states his mood and anxiety are good but he still having trouble sleeping.  This may have something to do with the leg cramps which need to be addressed by his PCP.  For now he will discontinue trazodone in favor of Belsomra 20 mg at bedtime for sleep.  He will continue Cymbalta 60 mg daily for depression.  Xanax will only be prescribed at 1 mg twice daily as needed since he is not using it that often.  He will return to see me in 3 months  Collaboration of Care: Collaboration of Care: Primary Care Provider AEB chart notes will be  released to PCP at patient's request  Patient/Guardian was advised Release of Information must be obtained prior to any record release in order to collaborate their care with an outside provider. Patient/Guardian was advised if they have not already done so to contact the registration department to sign all necessary forms in order for [place] to release information regarding their care.   Consent: Patient/Guardian gives verbal consent for treatment and assignment of benefits for services provided during this visit. Patient/Guardian expressed understanding and agreed to proceed.    [name], MD 12/19/2021, 4:41 PM

## 2021-12-22 ENCOUNTER — Telehealth (HOSPITAL_COMMUNITY): Payer: Self-pay | Admitting: *Deleted

## 2021-12-22 ENCOUNTER — Other Ambulatory Visit (HOSPITAL_COMMUNITY): Payer: Self-pay | Admitting: Psychiatry

## 2021-12-22 MED ORDER — ALPRAZOLAM 1 MG PO TABS: 1.0000 mg | ORAL_TABLET | Freq: Two times a day (BID) | ORAL | 2 refills | Status: DC | PRN

## 2021-12-22 NOTE — Telephone Encounter (Signed)
Patient pharmacy called stating they received 2 directions on patient Xanax script. They are needing provider to please send in a new script with the directions and quantity patient is instructed to do. They are also needing the Diagnosis code on script as well.  ?

## 2021-12-22 NOTE — Telephone Encounter (Signed)
sent 

## 2021-12-22 NOTE — Telephone Encounter (Signed)
noted 

## 2022-01-03 ENCOUNTER — Encounter: Payer: Self-pay | Admitting: Internal Medicine

## 2022-01-03 ENCOUNTER — Other Ambulatory Visit: Payer: Self-pay

## 2022-01-03 ENCOUNTER — Ambulatory Visit: Payer: Medicare HMO | Admitting: Internal Medicine

## 2022-01-03 VITALS — BP 138/62 | HR 83 | Temp 97.5°F | Ht 71.0 in | Wt 283.8 lb

## 2022-01-03 DIAGNOSIS — Z86010 Personal history of colonic polyps: Secondary | ICD-10-CM | POA: Diagnosis not present

## 2022-01-03 DIAGNOSIS — D509 Iron deficiency anemia, unspecified: Secondary | ICD-10-CM

## 2022-01-03 DIAGNOSIS — K219 Gastro-esophageal reflux disease without esophagitis: Secondary | ICD-10-CM | POA: Diagnosis not present

## 2022-01-03 NOTE — Progress Notes (Signed)
? ? ?Primary Care Physician:  Celene Squibb, MD ?Primary Gastroenterologist:  Dr. Gala Romney ? ?Pre-Procedure History & Physical: ?HPI:  Greg Kemp is a 61 y.o. male here for further evaluation of microcytic/iron deficiency anemia.  Referred by Dr. [place] Crane.  MCV 80 recently hemoglobin from 15-12.  No Hemoccults done as far as I can tell.  Iron studies not available. ? ?Patient denies melena or hematochezia or any change in bowel habits.  He has long, longstanding GERD controlled on Protonix 40 mg daily.  No dysphagia. ? ?History of colon cancer in a young daughter (and however, she was adopted).  History of advanced adenomas removed from his colon a little over 2 years ago; due for surveillance next year. ? ?Prior EGD 2010 revealed ulcerative reflux esophagitis, Schatzki's ring and duodenal bulbar erosions. ? ?Previously on NSAIDs but not now; creatinine borderline at 1.28. ? ?Past Medical History:  ?Diagnosis Date  ? Acid reflux   ? Anxiety   ? Cervical spinal stenosis   ? Chronic kidney disease   ? Stage III  ? Depression   ? Depression   ? GERD (gastroesophageal reflux disease)   ? Hyperlipidemia   ? Morbid obesity ([place])   ? Sleep apnea   ? Thyroid disease   ? ? ?Past Surgical History:  ?Procedure Laterality Date  ? COLONOSCOPY WITH ESOPHAGOGASTRODUODENOSCOPY (EGD)  2010  ? Dr. Gala Romney: ulcerative/erosive reflux esophagitis, noncritical schatki's ring, multiple bulbar erosions with normal sb biopsies, colonic diverticulosis, normal TI  ? COLONOSCOPY WITH PROPOFOL N/A 10/30/2019  ? Procedure: COLONOSCOPY WITH PROPOFOL;  Surgeon: Daneil Dolin, MD;  [facility];  Service: Endoscopy;  Laterality: N/A;  1:30pm  ? POLYPECTOMY  10/30/2019  ? Procedure: POLYPECTOMY;  Surgeon: Daneil Dolin, MD;  [facility];  Service: Endoscopy;;  ? TONSILLECTOMY    ? Upper and Lower GI    ? ? ?Prior to Admission medications   ?Medication Sig Start Date End Date Taking? Authorizing Provider  ?ALPRAZolam (XANAX) 1 MG  tablet Take 1 tablet (1 mg total) by mouth 2 (two) times daily as needed for anxiety. 12/22/21  Yes Cloria Spring, MD  ?cyclobenzaprine (FLEXERIL) 10 MG tablet Take 10 mg by mouth 2 (two) times daily as needed. 09/06/21  Yes [provider]  ?DULoxetine (CYMBALTA) 60 MG capsule Take 1 capsule (60 mg total) by mouth daily. 12/19/21  Yes Cloria Spring, MD  ?gabapentin (NEURONTIN) 300 MG capsule Take 300 mg by mouth 3 (three) times daily as needed (pain).   Yes [provider]  ?levothyroxine (SYNTHROID) 150 MCG tablet Take 150 mcg by mouth daily. 11/07/21  Yes [provider]  ?Multiple Vitamin (MULTIVITAMIN WITH MINERALS) TABS tablet Take 1 tablet by mouth daily.   Yes [provider]  ?olmesartan (BENICAR) 40 MG tablet Take 40 mg by mouth daily. 11/01/21  Yes [provider]  ?pantoprazole (PROTONIX) 40 MG tablet Take 40 mg by mouth daily.   Yes [provider]  ?Suvorexant (BELSOMRA) 20 MG TABS Take 20 mg by mouth at bedtime. ?Patient not taking: Reported on 01/03/2022 12/19/21   Cloria Spring, MD  ? ? ?Allergies as of 01/03/2022  ? (No Known Allergies)  ? ? ?Family History  ?Problem Relation Age of Onset  ? Alcohol abuse Father   ? Colon cancer Father   ? COPD Father   ? Depression Daughter   ?     acute  ? Sexual abuse Daughter   ?  Lung disease Mother   ? COPD Mother   ? Thyroid disease Mother   ? Alcohol abuse Paternal Uncle   ? Colon cancer Daughter 15  ? ADD / ADHD Neg Hx   ? Anxiety disorder Neg Hx   ? OCD Neg Hx   ? Drug abuse Neg Hx   ? Bipolar disorder Neg Hx   ? Dementia Neg Hx   ? Paranoid behavior Neg Hx   ? Schizophrenia Neg Hx   ? Seizures Neg Hx   ? Physical abuse Neg Hx   ? ? ?Social History  ? ?Socioeconomic History  ? Marital status: Married  ?  Spouse name: Not on file  ? Number of children: Not on file  ? Years of education: Not on file  ? Highest education level: Not on file  ?Occupational History  ? Not on file  ?Tobacco Use  ? Smoking  status: Never  ? Smokeless tobacco: Never  ?Substance and Sexual Activity  ? Alcohol use: Not Currently  ?  Comment: occ; update 10/26 "I don't drink anymore"  ? Drug use: No  ? Sexual activity: Yes  ?  Birth control/protection: Surgical  ?Other Topics Concern  ? Not on file  ?Social History Narrative  ? Lives at home with wife  ? Caffeine: coffee 1 large cup/day  ? ?Social Determinants of Health  ? ?Financial Resource Strain: Not on file  ?Food Insecurity: Not on file  ?Transportation Needs: Not on file  ?Physical Activity: Not on file  ?Stress: Not on file  ?Social Connections: Not on file  ?Intimate Partner Violence: Not on file  ? ? ?Review of Systems: ?See HPI, otherwise negative ROS ? ?Physical Exam: ?BP 138/62 (BP Location: Left Arm, Patient Position: Sitting, Cuff Size: Large)   Pulse 83   Temp (!) 97.5 ?F (36.4 ?C) (Temporal)   Ht '5\' 11"'$  (1.803 m)   Wt 283 lb 12.8 oz (128.7 kg)   SpO2 98%   BMI 39.58 kg/m?  ?General:   Alert,   pleasant and cooperative in NAD ?Neck:  Supple; no masses or thyromegaly. No significant cervical adenopathy. ?Lungs:  Clear throughout to auscultation.   No wheezes, crackles, or rhonchi. No acute distress. ?Heart:  Regular rate and rhythm; no murmurs, clicks, rubs,  or gallops. ?Abdomen: Non-distended, normal bowel sounds.  Soft and nontender without appreciable mass or hepatosplenomegaly.  ?Pulses:  Normal pulses noted. ?Extremities:  Without clubbing or edema. ? ?Impression/Plan: 60 year old gentleman referred for microcytic/iron deficiency anemia without overt GI bleeding.  History of NSAID use.  History of complicated GERD.  History of advanced colonic adenomas removed previously. ? ?Not mentioned above he does donate blood to the [employer] every 3 to 4 months.  That may be playing into his borderline low indices/hemoglobin. ? ?However, in an abundance of caution I feel he has upper GI tract: Need to be reevaluated at this time. ? ?Recommendations ? ?I have offered the  patient both a diagnostic EGD and colonoscopy in the near future.  ASA 3. ?The risks, benefits, limitations, imponderables and alternatives regarding both EGD and colonoscopy have been reviewed with the patient. Questions have been answered. All parties agreeable.   ? ? ? ? ?Notice: This dictation was prepared with Dragon dictation along with smaller phrase technology. Any transcriptional errors that result from this process are unintentional and may not be corrected upon review.  ?

## 2022-01-03 NOTE — Patient Instructions (Signed)
It was good to see you again today! ? ?As discussed, we will schedule a diagnostic EGD and a colonoscopy (history of longstanding GERD and colonic polyps) ?ASA 3. ? ?Continue Protonix 40 mg daily. ? ?Further recommendations to follow. ?

## 2022-01-06 DIAGNOSIS — M47812 Spondylosis without myelopathy or radiculopathy, cervical region: Secondary | ICD-10-CM | POA: Diagnosis not present

## 2022-01-11 ENCOUNTER — Telehealth: Payer: Self-pay | Admitting: *Deleted

## 2022-01-11 ENCOUNTER — Encounter: Payer: Self-pay | Admitting: *Deleted

## 2022-01-11 MED ORDER — PEG 3350-KCL-NA BICARB-NACL 420 G PO SOLR: ORAL | 0 refills | Status: DC

## 2022-01-11 NOTE — Telephone Encounter (Signed)
Called pt and spoke with spouse. He has been scheduled for TCS/EGD, asa 3 Dr. Gala Romney on 5/11 at 12pm. Aware will mail prep instructions/pre-op appt. Will send rx to pharmacy. ? ?PA approved via [employer]here for TCS. Auth# [id number], DOS: 02/16/2022 - 05/17/2022 ?PA approved for egd. AUTH# [id number], DOS: 02/16/2022 - 05/17/2022 ?

## 2022-01-12 ENCOUNTER — Telehealth (HOSPITAL_COMMUNITY): Payer: Self-pay | Admitting: Psychiatry

## 2022-01-12 NOTE — Telephone Encounter (Signed)
Called to schedule F/u appt left vm ?

## 2022-01-23 DIAGNOSIS — N1831 Chronic kidney disease, stage 3a: Secondary | ICD-10-CM | POA: Diagnosis not present

## 2022-01-23 DIAGNOSIS — E782 Mixed hyperlipidemia: Secondary | ICD-10-CM | POA: Diagnosis not present

## 2022-01-23 DIAGNOSIS — F3181 Bipolar II disorder: Secondary | ICD-10-CM | POA: Diagnosis not present

## 2022-01-23 DIAGNOSIS — G4701 Insomnia due to medical condition: Secondary | ICD-10-CM | POA: Diagnosis not present

## 2022-01-23 DIAGNOSIS — M545 Low back pain, unspecified: Secondary | ICD-10-CM | POA: Diagnosis not present

## 2022-01-23 DIAGNOSIS — E876 Hypokalemia: Secondary | ICD-10-CM | POA: Diagnosis not present

## 2022-01-23 DIAGNOSIS — K219 Gastro-esophageal reflux disease without esophagitis: Secondary | ICD-10-CM | POA: Diagnosis not present

## 2022-01-23 DIAGNOSIS — E039 Hypothyroidism, unspecified: Secondary | ICD-10-CM | POA: Diagnosis not present

## 2022-01-23 DIAGNOSIS — D509 Iron deficiency anemia, unspecified: Secondary | ICD-10-CM | POA: Diagnosis not present

## 2022-01-23 DIAGNOSIS — F411 Generalized anxiety disorder: Secondary | ICD-10-CM | POA: Diagnosis not present

## 2022-02-06 DIAGNOSIS — M47812 Spondylosis without myelopathy or radiculopathy, cervical region: Secondary | ICD-10-CM | POA: Diagnosis not present

## 2022-02-13 NOTE — Patient Instructions (Addendum)
? ? ? ? ? ? ? ? Greg Kemp ? 02/13/2022  ?  ? '@PREFPERIOPPHARMACY'$ @ ? ? Your procedure is scheduled on  02/16/2022. ? ? Report to [facility] at  82  A.M. ? ? Call this number if you have problems the morning of surgery: ? [phone] ? ? Remember: ? Follow the diet and prep instructions given to you by the office. ?  ? Take these medicines the morning of surgery with A SIP OF WATER  ? ?                 xanax(if needed), flexeril(if needed), gabapentin, levothyroxine, protonix. ?  ? Do not wear jewelry, make-up or nail polish. ? Do not wear lotions, powders, or perfumes, or deodorant. ? Do not shave 48 hours prior to surgery.  Men may shave face and neck. ? Do not bring valuables to the hospital. ? [facility] is not responsible for any belongings or valuables. ? ?Contacts, dentures or bridgework may not be worn into surgery.  Leave your suitcase in the car.  After surgery it may be brought to your room. ? ?For patients admitted to the hospital, discharge time will be determined by your treatment team. ? ?Patients discharged the day of surgery will not be allowed to drive home and must have someone with them for 24 hours.  ? ? ?Special instructions:   DO NOT smoke tobacco or vape for 24 hours before your procedure. ? ?Please read over the following fact sheets that you were given. ?Anesthesia Post-op Instructions and Care and Recovery After Surgery ? ?  ? ? ? Upper Endoscopy, Adult, Care After ?This sheet gives you information about how to care for yourself after your procedure. Your health care provider may also give you more specific instructions. If you have problems or questions, contact your health care provider. ?What can I expect after the procedure? ?After the procedure, it is common to have: ?A sore throat. ?Mild stomach pain or discomfort. ?Bloating. ?Nausea. ?Follow these instructions at home: ? ?Follow instructions from your health care provider about what to eat or drink after your procedure. ?Return  to your normal activities as told by your health care provider. Ask your health care provider what activities are safe for you. ?Take over-the-counter and prescription medicines only as told by your health care provider. ?If you were given a sedative during the procedure, it can affect you for several hours. Do not drive or operate machinery until your health care provider says that it is safe. ?Keep all follow-up visits as told by your health care provider. This is important. ?Contact a health care provider if you have: ?A sore throat that lasts longer than one day. ?Trouble swallowing. ?Get help right away if: ?You vomit blood or your vomit looks like coffee grounds. ?You have: ?A fever. ?Bloody, black, or tarry stools. ?A severe sore throat or you cannot swallow. ?Difficulty breathing. ?Severe pain in your chest or abdomen. ?Summary ?After the procedure, it is common to have a sore throat, mild stomach discomfort, bloating, and nausea. ?If you were given a sedative during the procedure, it can affect you for several hours. Do not drive or operate machinery until your health care provider says that it is safe. ?Follow instructions from your health care provider about what to eat or drink after your procedure. ?Return to your normal activities as told by your health care provider. ?This information is not intended to replace advice given to you by  your health care provider. Make sure you discuss any questions you have with your health care provider. ?Document Revised: 08/01/2019 Document Reviewed: 02/25/2018 ?Elsevier Patient Education ? [place]. ?Colonoscopy, Adult, Care After ?The following information offers guidance on how to care for yourself after your procedure. Your health care provider may also give you more specific instructions. If you have problems or questions, contact your health care provider. ?What can I expect after the procedure? ?After the procedure, it is common to have: ?A small  amount of blood in your stool for 24 hours after the procedure. ?Some gas. ?Mild cramping or bloating of your abdomen. ?Follow these instructions at home: ?Eating and drinking ? ?Drink enough fluid to keep your urine pale yellow. ?Follow instructions from your health care provider about eating or drinking restrictions. ?Resume your normal diet as told by your health care provider. Avoid heavy or fried foods that are hard to digest. ?Activity ?Rest as told by your health care provider. ?Avoid sitting for a long time without moving. Get up to take short walks every 1-2 hours. This is important to improve blood flow and breathing. Ask for help if you feel weak or unsteady. ?Return to your normal activities as told by your health care provider. Ask your health care provider what activities are safe for you. ?Managing cramping and bloating ? ?Try walking around when you have cramps or feel bloated. ?If directed, apply heat to your abdomen as told by your health care provider. Use the heat source that your health care provider recommends, such as a moist heat pack or a heating pad. ?Place a towel between your skin and the heat source. ?Leave the heat on for 20-30 minutes. ?Remove the heat if your skin turns bright red. This is especially important if you are unable to feel pain, heat, or cold. You have a greater risk of getting burned. ?General instructions ?If you were given a sedative during the procedure, it can affect you for several hours. Do not drive or operate machinery until your health care provider says that it is safe. ?For the first 24 hours after the procedure: ?Do not sign important documents. ?Do not drink alcohol. ?Do your regular daily activities at a slower pace than normal. ?Eat soft foods that are easy to digest. ?Take over-the-counter and prescription medicines only as told by your health care provider. ?Keep all follow-up visits. This is important. ?Contact a health care provider if: ?You have blood  in your stool 2-3 days after the procedure. ?Get help right away if: ?You have more than a small spotting of blood in your stool. ?You have large blood clots in your stool. ?You have swelling of your abdomen. ?You have nausea or vomiting. ?You have a fever. ?You have increasing pain in your abdomen that is not relieved with medicine. ?These symptoms may be an emergency. Get help right away. Call 911. ?Do not wait to see if the symptoms will go away. ?Do not drive yourself to the hospital. ?Summary ?After the procedure, it is common to have a small amount of blood in your stool. You may also have mild cramping and bloating of your abdomen. ?If you were given a sedative during the procedure, it can affect you for several hours. Do not drive or operate machinery until your health care provider says that it is safe. ?Get help right away if you have a lot of blood in your stool, nausea or vomiting, a fever, or increased pain in your abdomen. ?  This information is not intended to replace advice given to you by your health care provider. Make sure you discuss any questions you have with your health care provider. ?Document Revised: 05/18/2021 Document Reviewed: 05/18/2021 ?Elsevier Patient Education ? [place]. ?Monitored Anesthesia Care, Care After ?This sheet gives you information about how to care for yourself after your procedure. Your health care provider may also give you more specific instructions. If you have problems or questions, contact your health care provider. ?What can I expect after the procedure? ?After the procedure, it is common to have: ?Tiredness. ?Forgetfulness about what happened after the procedure. ?Impaired judgment for important decisions. ?Nausea or vomiting. ?Some difficulty with balance. ?Follow these instructions at home: ?For the time period you were told by your health care provider: ? ?  ? ?Rest as needed. ?Do not participate in activities where you could fall or become  injured. ?Do not drive or use machinery. ?Do not drink alcohol. ?Do not take sleeping pills or medicines that cause drowsiness. ?Do not make important decisions or sign legal documents. ?Do not take care of children

## 2022-02-14 ENCOUNTER — Encounter (HOSPITAL_COMMUNITY)
Admission: RE | Admit: 2022-02-14 | Discharge: 2022-02-14 | Disposition: A | Discharge status: Home / Self Care | Payer: Medicare HMO | Source: Ambulatory Visit | Attending: Internal Medicine | Admitting: Internal Medicine

## 2022-02-14 VITALS — BP 137/69 | HR 76 | Temp 97.8°F | Resp 18 | Ht 71.0 in | Wt 285.0 lb

## 2022-02-14 DIAGNOSIS — Z01818 Encounter for other preprocedural examination: Secondary | ICD-10-CM | POA: Insufficient documentation

## 2022-02-14 DIAGNOSIS — I10 Essential (primary) hypertension: Secondary | ICD-10-CM | POA: Diagnosis not present

## 2022-02-14 DIAGNOSIS — D649 Anemia, unspecified: Secondary | ICD-10-CM | POA: Insufficient documentation

## 2022-02-14 LAB — CBC WITH DIFFERENTIAL/PLATELET
Abs Immature Granulocytes: 0.01 10*3/uL (ref 0.00–0.07)
Basophils Absolute: 0.1 10*3/uL (ref 0.0–0.1)
Basophils Relative: 1 %
Eosinophils Absolute: 0.2 10*3/uL (ref 0.0–0.5)
Eosinophils Relative: 4 %
HCT: 39.6 % (ref 39.0–52.0)
Hemoglobin: 12.6 g/dL — ABNORMAL LOW (ref 13.0–17.0)
Immature Granulocytes: 0 %
Lymphocytes Relative: 38 %
Lymphs Abs: 2 10*3/uL (ref 0.7–4.0)
MCH: 26.4 pg (ref 26.0–34.0)
MCHC: 31.8 g/dL (ref 30.0–36.0)
MCV: 83 fL (ref 80.0–100.0)
Monocytes Absolute: 0.4 10*3/uL (ref 0.1–1.0)
Monocytes Relative: 8 %
Neutro Abs: 2.7 10*3/uL (ref 1.7–7.7)
Neutrophils Relative %: 49 %
Platelets: 192 10*3/uL (ref 150–400)
RBC: 4.77 MIL/uL (ref 4.22–5.81)
RDW: 17.3 % — ABNORMAL HIGH (ref 11.5–15.5)
WBC: 5.4 10*3/uL (ref 4.0–10.5)
nRBC: 0 % (ref 0.0–0.2)

## 2022-02-16 ENCOUNTER — Ambulatory Visit (HOSPITAL_COMMUNITY)
Admission: RE | Admit: 2022-02-16 | Discharge: 2022-02-16 | Disposition: A | Discharge status: Home / Self Care | Payer: Medicare HMO | Source: Ambulatory Visit | Attending: Internal Medicine | Admitting: Internal Medicine

## 2022-02-16 ENCOUNTER — Encounter (HOSPITAL_COMMUNITY)
Admission: RE | Disposition: A | Discharge status: Home / Self Care | Payer: Self-pay | Source: Ambulatory Visit | Attending: Internal Medicine

## 2022-02-16 ENCOUNTER — Ambulatory Visit (HOSPITAL_COMMUNITY): Payer: Medicare HMO | Admitting: Anesthesiology

## 2022-02-16 ENCOUNTER — Encounter (HOSPITAL_COMMUNITY): Payer: Self-pay | Admitting: Internal Medicine

## 2022-02-16 ENCOUNTER — Ambulatory Visit (HOSPITAL_BASED_OUTPATIENT_CLINIC_OR_DEPARTMENT_OTHER): Payer: Medicare HMO | Admitting: Anesthesiology

## 2022-02-16 DIAGNOSIS — K449 Diaphragmatic hernia without obstruction or gangrene: Secondary | ICD-10-CM | POA: Diagnosis not present

## 2022-02-16 DIAGNOSIS — K219 Gastro-esophageal reflux disease without esophagitis: Secondary | ICD-10-CM | POA: Diagnosis not present

## 2022-02-16 DIAGNOSIS — F32A Depression, unspecified: Secondary | ICD-10-CM | POA: Diagnosis not present

## 2022-02-16 DIAGNOSIS — D123 Benign neoplasm of transverse colon: Secondary | ICD-10-CM | POA: Diagnosis not present

## 2022-02-16 DIAGNOSIS — K635 Polyp of colon: Secondary | ICD-10-CM

## 2022-02-16 DIAGNOSIS — F419 Anxiety disorder, unspecified: Secondary | ICD-10-CM | POA: Insufficient documentation

## 2022-02-16 DIAGNOSIS — E039 Hypothyroidism, unspecified: Secondary | ICD-10-CM | POA: Insufficient documentation

## 2022-02-16 DIAGNOSIS — Z86010 Personal history of colonic polyps: Secondary | ICD-10-CM | POA: Insufficient documentation

## 2022-02-16 DIAGNOSIS — G4730 Sleep apnea, unspecified: Secondary | ICD-10-CM | POA: Insufficient documentation

## 2022-02-16 DIAGNOSIS — D509 Iron deficiency anemia, unspecified: Secondary | ICD-10-CM

## 2022-02-16 DIAGNOSIS — Z6839 Body mass index (BMI) 39.0-39.9, adult: Secondary | ICD-10-CM | POA: Diagnosis not present

## 2022-02-16 HISTORY — PX: ESOPHAGOGASTRODUODENOSCOPY (EGD) WITH PROPOFOL: SHX5813

## 2022-02-16 HISTORY — PX: POLYPECTOMY: SHX149

## 2022-02-16 HISTORY — PX: COLONOSCOPY WITH PROPOFOL: SHX5780

## 2022-02-16 SURGERY — COLONOSCOPY WITH PROPOFOL
Anesthesia: General

## 2022-02-16 MED ORDER — LIDOCAINE HCL (CARDIAC) PF 100 MG/5ML IV SOSY
PREFILLED_SYRINGE | INTRAVENOUS | Status: DC | PRN
  Administered 2022-02-16: 50 mg via INTRAVENOUS

## 2022-02-16 MED ORDER — LACTATED RINGERS IV SOLN: INTRAVENOUS | Status: DC | PRN

## 2022-02-16 MED ORDER — PROPOFOL 10 MG/ML IV BOLUS
INTRAVENOUS | Status: DC | PRN
  Administered 2022-02-16: 100 mg via INTRAVENOUS

## 2022-02-16 MED ORDER — PROPOFOL 500 MG/50ML IV EMUL
INTRAVENOUS | Status: DC | PRN
  Administered 2022-02-16: 180 ug/kg/min via INTRAVENOUS

## 2022-02-16 MED ORDER — LIDOCAINE HCL (PF) 2 % IJ SOLN
INTRAMUSCULAR | Status: AC
  Filled 2022-02-16: qty 5

## 2022-02-16 NOTE — Op Note (Signed)
[facility] ?Patient Name: Greg Kemp ?Procedure Date: 02/16/2022 11:04 AM ?MRN: [id number] ?Date of Birth: Feb 17, 1961 ?Attending MD: Norvel Richards , MD ?CSN: [id number] ?Age: 61 ?Admit Type: Outpatient ?Procedure:                Upper GI endoscopy ?Indications:              Esophageal reflux; reported IDA ?Providers:                Norvel Richards, MD, Janeece Riggers, RN, Crystal  ?                          Page ?Referring MD:              ?Medicines:                Propofol per Anesthesia ?Complications:            No immediate complications. ?Estimated Blood Loss:     Estimated blood loss: none. ?Procedure:                Pre-Anesthesia Assessment: ?                          - Prior to the procedure, a History and Physical  ?                          was performed, and patient medications and  ?                          allergies were reviewed. The patient's tolerance of  ?                          previous anesthesia was also reviewed. The risks  ?                          and benefits of the procedure and the sedation  ?                          options and risks were discussed with the patient.  ?                          All questions were answered, and informed consent  ?                          was obtained. Prior Anticoagulants: The patient has  ?                          taken no previous anticoagulant or antiplatelet  ?                          agents. ASA Grade Assessment: III - A patient with  ?                          severe systemic disease. After reviewing the risks  ?  and benefits, the patient was deemed in  ?                          satisfactory condition to undergo the procedure. ?                          After obtaining informed consent, the endoscope was  ?                          passed under direct vision. Throughout the  ?                          procedure, the patient's blood pressure, pulse, and  ?                          oxygen saturations were  monitored continuously. The  ?                          [id number]-[id number] ([id number]) scope was introduced through the  ?                          mouth, and advanced to the second part of duodenum.  ?                          The upper GI endoscopy was accomplished without  ?                          difficulty. The patient tolerated the procedure  ?                          well. ?Scope In: 11:57:49 AM ?Scope Out: 12:01:21 PM ?Total Procedure Duration: 0 hours 3 minutes 32 seconds  ?Findings: ?     The examined esophagus was normal. ?     A small hiatal hernia was present. ?     The duodenal bulb and second portion of the duodenum were normal. ?Impression:               - Normal esophagus. ?                          - Small hiatal hernia. ?                          - Normal duodenal bulb and second portion of the  ?                          duodenum. ?                          - No specimens collected. ?Moderate Sedation: ?     Moderate (conscious) sedation was personally administered by an  ?     anesthesia professional. The following parameters were monitored: oxygen  ?     saturation, heart rate, blood pressure, and response to care. ?Recommendation:           - Patient has a contact number available for  ?  emergencies. The signs and symptoms of potential  ?                          delayed complications were discussed with the  ?                          patient. Return to normal activities tomorrow.  ?                          Written discharge instructions were provided to the  ?                          patient. ?                          - Resume previous diet. ?                          - Continue present medications. ?                          - Return to my office (date not yet determined). ?Procedure Code(s):        --- Professional --- ?                          [phone], Esophagogastroduodenoscopy, flexible,  ?                          transoral; diagnostic, including collection of  ?                           specimen(s) by brushing or washing, when performed  ?                          (separate procedure) ?Diagnosis Code(s):        --- Professional --- ?                          K44.9, Diaphragmatic hernia without obstruction or  ?                          gangrene ?                          K21.9, Gastro-esophageal reflux disease without  ?                          esophagitis ?CPT copyright 2019 American Medical Association. All rights reserved. ?The codes documented in this report are preliminary and upon coder review may  ?be revised to meet current compliance requirements. ?Cristopher Estimable. Shamonique Battiste, MD ?Norvel Richards, MD ?02/16/2022 12:35:28 PM ?This report has been signed electronically. ?Number of Addenda: 0 ?

## 2022-02-16 NOTE — Op Note (Signed)
[facility] ?Patient Name: Greg Kemp ?Procedure Date: 02/16/2022 12:02 PM ?MRN: [id number] ?Date of Birth: 1961/06/23 ?Attending MD: Norvel Richards , MD ?CSN: [id number] ?Age: 61 ?Admit Type: Outpatient ?Procedure:                Colonoscopy ?Indications:              Iron deficiency anemia ?Providers:                Norvel Richards, MD, Janeece Riggers, RN, Crystal  ?                          Page ?Referring MD:              ?Medicines:                Propofol per Anesthesia ?Complications:            No immediate complications. ?Estimated Blood Loss:     Estimated blood loss was minimal. ?Procedure:                Pre-Anesthesia Assessment: ?                          - Prior to the procedure, a History and Physical  ?                          was performed, and patient medications and  ?                          allergies were reviewed. The patient's tolerance of  ?                          previous anesthesia was also reviewed. The risks  ?                          and benefits of the procedure and the sedation  ?                          options and risks were discussed with the patient.  ?                          All questions were answered, and informed consent  ?                          was obtained. Prior Anticoagulants: The patient has  ?                          taken no previous anticoagulant or antiplatelet  ?                          agents. ASA Grade Assessment: III - A patient with  ?                          severe systemic disease. After reviewing the risks  ?  and benefits, the patient was deemed in  ?                          satisfactory condition to undergo the procedure. ?                          After obtaining informed consent, the colonoscope  ?                          was passed under direct vision. Throughout the  ?                          procedure, the patient's blood pressure, pulse, and  ?                          oxygen saturations were  monitored continuously. The  ?                          [id number]) scope was introduced through the  ?                          anus and advanced to the the cecum, identified by  ?                          appendiceal orifice and ileocecal valve. The  ?                          colonoscopy was performed without difficulty. The  ?                          patient tolerated the procedure well. The quality  ?                          of the bowel preparation was adequate. The  ?                          ileocecal valve, appendiceal orifice, and rectum  ?                          were photographed. The entire colon was well  ?                          visualized. ?Scope In: 12:05:55 PM ?Scope Out: 12:33:10 PM ?Scope Withdrawal Time: 0 hours 9 minutes 59 seconds  ?Total Procedure Duration: 0 hours 27 minutes 15 seconds  ?Findings: ?     The perianal and digital rectal examinations were normal. ?     Three semi-pedunculated polyps were found in the splenic flexure and  ?     hepatic flexure. The polyps were 4 to 5 mm in size. These polyps were  ?     removed with a cold snare. Resection and retrieval were complete.  ?     Estimated blood loss was minimal. ?     The exam was otherwise without abnormality on direct and retroflexion  ?     views. ?Impression:               -  Three 4 to 5 mm polyps at the splenic flexure and  ?                          at the hepatic flexure, removed with a cold snare.  ?                          Resected and retrieved. ?                          - The examination was otherwise normal on direct  ?                          and retroflexion views. ?Moderate Sedation: ?     Moderate (conscious) sedation was personally administered by an  ?     anesthesia professional. The following parameters were monitored: oxygen  ?     saturation, heart rate, blood pressure, respiratory rate, EKG, adequacy  ?     of pulmonary ventilation, and response to care. ?Recommendation:           - Patient has  a contact number available for  ?                          emergencies. The signs and symptoms of potential  ?                          delayed complications were discussed with the  ?                          patient. Return to normal activities tomorrow.  ?                          Written discharge instructions were provided to the  ?                          patient. ?                          - Resume previous diet. ?                          - Continue present medications. ?                          - Repeat colonoscopy date to be determined after  ?                          pending pathology results are reviewed for  ?                          surveillance. ?                          - Return to GI office (date not yet determined).  ?                          See EGD report ?  Procedure Code(s):        --- Professional --- ?                          [phone], Colonoscopy, flexible; with removal of  ?                          tumor(s), polyp(s), or other lesion(s) by snare  ?                          technique ?Diagnosis Code(s):        --- Professional --- ?                          K63.5, Polyp of colon ?                          D50.9, Iron deficiency anemia, unspecified ?CPT copyright 2019 American Medical Association. All rights reserved. ?The codes documented in this report are preliminary and upon coder review may  ?be revised to meet current compliance requirements. ?Cristopher Estimable. Mohid Furuya, MD ?Norvel Richards, MD ?02/16/2022 12:45:37 PM ?This report has been signed electronically. ?Number of Addenda: 0 ?

## 2022-02-16 NOTE — H&P (Signed)
$'@LOGO'S$ @ ? ? ?Primary Care Physician:  Celene Squibb, MD ?Primary Gastroenterologist:  Dr.  Marland Kitchen ?Pre-Procedure History & Physical: ?HPI:  Greg Kemp is a 61 y.o. male here for further evaluation of iron deficiency anemia.  History of severe ulcerative reflux esophagitis previously and multiple colonic polyps previously.  Denies dysphagia. ? ?Past Medical History:  ?Diagnosis Date  ? Acid reflux   ? Anxiety   ? Cervical spinal stenosis   ? Chronic kidney disease   ? Stage III  ? Depression   ? Depression   ? GERD (gastroesophageal reflux disease)   ? Hyperlipidemia   ? Morbid obesity ([place])   ? Sleep apnea   ? Thyroid disease   ? ? ?Past Surgical History:  ?Procedure Laterality Date  ? COLONOSCOPY WITH ESOPHAGOGASTRODUODENOSCOPY (EGD)  2010  ? Dr. Gala Romney: ulcerative/erosive reflux esophagitis, noncritical schatki's ring, multiple bulbar erosions with normal sb biopsies, colonic diverticulosis, normal TI  ? COLONOSCOPY WITH PROPOFOL N/A 10/30/2019  ? Procedure: COLONOSCOPY WITH PROPOFOL;  Surgeon: Daneil Dolin, MD;  Location: [facility];  Service: Endoscopy;  Laterality: N/A;  1:30pm  ? POLYPECTOMY  10/30/2019  ? Procedure: POLYPECTOMY;  Surgeon: Daneil Dolin, MD;  Location: [facility];  Service: Endoscopy;;  ? TONSILLECTOMY    ? Upper and Lower GI    ? ? ?Prior to Admission medications   ?Medication Sig Start Date End Date Taking? Authorizing Provider  ?ALPRAZolam (XANAX) 1 MG tablet Take 1 tablet (1 mg total) by mouth 2 (two) times daily as needed for anxiety. 12/22/21  Yes Cloria Spring, MD  ?amLODipine (NORVASC) 5 MG tablet Take 5 mg by mouth at bedtime. 01/11/22  Yes [provider]  ?cyclobenzaprine (FLEXERIL) 10 MG tablet Take 10 mg by mouth 2 (two) times daily as needed for muscle spasms. 09/06/21  Yes [provider]  ?DULoxetine (CYMBALTA) 60 MG capsule Take 1 capsule (60 mg total) by mouth daily. 12/19/21  Yes Cloria Spring, MD  ?gabapentin (NEURONTIN) 300 MG capsule Take 300 mg by  mouth 3 (three) times daily as needed (pain).   Yes [provider]  ?levothyroxine (SYNTHROID) 150 MCG tablet Take 150 mcg by mouth daily before breakfast. 11/07/21  Yes [provider]  ?Multiple Vitamin (MULTIVITAMIN WITH MINERALS) TABS tablet Take 1 tablet by mouth daily.   Yes [provider]  ?olmesartan (BENICAR) 40 MG tablet Take 40 mg by mouth daily. 11/01/21  Yes [provider]  ?pantoprazole (PROTONIX) 40 MG tablet Take 40 mg by mouth daily.   Yes [provider]  ?Suvorexant (BELSOMRA) 20 MG TABS Take 20 mg by mouth at bedtime. 12/19/21  Yes Cloria Spring, MD  ?polyethylene glycol-electrolytes (NULYTELY) 420 g solution As directed 01/11/22   Yurani Fettes, Cristopher Estimable, MD  ? ? ?Allergies as of 01/11/2022  ? (No Known Allergies)  ? ? ?Family History  ?Problem Relation Age of Onset  ? Alcohol abuse Father   ? Colon cancer Father   ? COPD Father   ? Depression Daughter   ?     acute  ? Sexual abuse Daughter   ? Lung disease Mother   ? COPD Mother   ? Thyroid disease Mother   ? Alcohol abuse Paternal Uncle   ? Colon cancer Daughter 77  ? ADD / ADHD Neg Hx   ? Anxiety disorder Neg Hx   ? OCD Neg Hx   ? Drug abuse Neg Hx   ? Bipolar disorder  Neg Hx   ? Dementia Neg Hx   ? Paranoid behavior Neg Hx   ? Schizophrenia Neg Hx   ? Seizures Neg Hx   ? Physical abuse Neg Hx   ? ? ?Social History  ? ?Socioeconomic History  ? Marital status: Married  ?  Spouse name: Not on file  ? Number of children: Not on file  ? Years of education: Not on file  ? Highest education level: Not on file  ?Occupational History  ? Not on file  ?Tobacco Use  ? Smoking status: Never  ? Smokeless tobacco: Never  ?Substance and Sexual Activity  ? Alcohol use: Not Currently  ?  Comment: occ; update 10/26 "I don't drink anymore"  ? Drug use: No  ? Sexual activity: Yes  ?  Birth control/protection: Surgical  ?Other Topics Concern  ? Not on file  ?Social History Narrative  ? Lives at home with wife  ? Caffeine:  coffee 1 large cup/day  ? ?Social Determinants of Health  ? ?Financial Resource Strain: Not on file  ?Food Insecurity: Not on file  ?Transportation Needs: Not on file  ?Physical Activity: Not on file  ?Stress: Not on file  ?Social Connections: Not on file  ?Intimate Partner Violence: Not on file  ? ? ?Review of Systems: ?See HPI, otherwise negative ROS ? ?Physical Exam: ?BP 95/66 (BP Location: Right Arm)   Pulse 70   Temp 97.9 ?F (36.6 ?C)   Resp 18   SpO2 99%  ?General:   Alert,  Well-developed, well-nourished, pleasant and cooperative in NAD ?Neck:  Supple; no masses or thyromegaly. No significant cervical adenopathy. ?Lungs:  Clear throughout to auscultation.   No wheezes, crackles, or rhonchi. No acute distress. ?Heart:  Regular rate and rhythm; no murmurs, clicks, rubs,  or gallops. ?Abdomen: Non-distended, normal bowel sounds.  Soft and nontender without appreciable mass or hepatosplenomegaly.  ?Pulses:  Normal pulses noted. ?Extremities:  Without clubbing or edema. ? ?Impression/Plan: 61 year old gentleman with a severe history of severe ulcerative reflux esophagitis, iron deficiency anemia history multiple colonic adenomas removed previously. ? ?He is here for an EGD and colonoscopy to further evaluate this presentation. ? ?The risks, benefits, limitations, imponderables and alternatives regarding both EGD and colonoscopy have been reviewed with the patient. Questions have been answered. All parties agreeable.   ? ? ? ? ?Notice: This dictation was prepared with Dragon dictation along with smaller phrase technology. Any transcriptional errors that result from this process are unintentional and may not be corrected upon review.  ? ?

## 2022-02-16 NOTE — Discharge Instructions (Signed)
?  Colonoscopy ?Discharge Instructions ? ?Read the instructions outlined below and refer to this sheet in the next few weeks. These discharge instructions provide you with general information on caring for yourself after you leave the hospital. Your doctor may also give you specific instructions. While your treatment has been planned according to the most current medical practices available, unavoidable complications occasionally occur. If you have any problems or questions after discharge, call Dr. Gala Romney at [phone]. ?ACTIVITY ?You may resume your regular activity, but move at a slower pace for the next 24 hours.  ?Take frequent rest periods for the next 24 hours.  ?Walking will help get rid of the air and reduce the bloated feeling in your belly (abdomen).  ?No driving for 24 hours (because of the medicine (anesthesia) used during the test).   ?Do not sign any important legal documents or operate any machinery for 24 hours (because of the anesthesia used during the test).  ?NUTRITION ?Drink plenty of fluids.  ?You may resume your normal diet as instructed by your doctor.  ?Begin with a light meal and progress to your normal diet. Heavy or fried foods are harder to digest and may make you feel sick to your stomach (nauseated).  ?Avoid alcoholic beverages for 24 hours or as instructed.  ?MEDICATIONS ?You may resume your normal medications unless your doctor tells you otherwise.  ?WHAT YOU CAN EXPECT TODAY ?Some feelings of bloating in the abdomen.  ?Passage of more gas than usual.  ?Spotting of blood in your stool or on the toilet paper.  ?IF YOU HAD POLYPS REMOVED DURING THE COLONOSCOPY: ?No aspirin products for 7 days or as instructed.  ?No alcohol for 7 days or as instructed.  ?Eat a soft diet for the next 24 hours.  ?FINDING OUT THE RESULTS OF YOUR TEST ?Not all test results are available during your visit. If your test results are not back during the visit, make an appointment with your caregiver to find out the  results. Do not assume everything is normal if you have not heard from your caregiver or the medical facility. It is important for you to follow up on all of your test results.  ?SEEK IMMEDIATE MEDICAL ATTENTION IF: ?You have more than a spotting of blood in your stool.  ?Your belly is swollen (abdominal distention).  ?You are nauseated or vomiting.  ?You have a temperature over 101.  ?You have abdominal pain or discomfort that is severe or gets worse throughout the day.   ? ? ?Hiatal hernia only found on EGD today ? ?3 small polyps removed from your colon ? ?Further recommendations to follow pending review of pathology report ? ?Office visit with [place] in 4 months ? ?At patient request, I called Laquita at [phone] -reviewed findings and recommendations ?

## 2022-02-16 NOTE — Anesthesia Preprocedure Evaluation (Signed)
Anesthesia Evaluation  ?Patient identified by MRN, date of birth, ID band ?Patient awake ? ? ? ?Reviewed: ?Allergy & Precautions, H&P , NPO status , Patient's Chart, lab work & pertinent test results, reviewed documented beta blocker date and time  ? ?Airway ?Mallampati: II ? ?TM Distance: >3 FB ?Neck ROM: full ? ? ? Dental ?no notable dental hx. ? ?  ?Pulmonary ?sleep apnea ,  ?  ?Pulmonary exam normal ?breath sounds clear to auscultation ? ? ? ? ? ? Cardiovascular ?Exercise Tolerance: Good ?negative cardio ROS ? ? ?Rhythm:regular Rate:Normal ? ? ?  ?Neuro/Psych ?PSYCHIATRIC DISORDERS Anxiety Depression Bipolar Disorder negative neurological ROS ?   ? GI/Hepatic ?Neg liver ROS, GERD  Medicated,  ?Endo/Other  ?Hypothyroidism Morbid obesity ? Renal/GU ?CRFRenal disease  ?negative genitourinary ?  ?Musculoskeletal ? ? Abdominal ?  ?Peds ? Hematology ?negative hematology ROS ?(+)   ?Anesthesia Other Findings ? ? Reproductive/Obstetrics ?negative OB ROS ? ?  ? ? ? ? ? ? ? ? ? ? ? ? ? ?  ?  ? ? ? ? ? ? ? ? ?Anesthesia Physical ?Anesthesia Plan ? ?ASA: 3 ? ?Anesthesia Plan: General  ? ?Post-op Pain Management:   ? ?Induction:  ? ?PONV Risk Score and Plan: Propofol infusion ? ?Airway Management Planned:  ? ?Additional Equipment:  ? ?Intra-op Plan:  ? ?Post-operative Plan:  ? ?Informed Consent: I have reviewed the patients History and Physical, chart, labs and discussed the procedure including the risks, benefits and alternatives for the proposed anesthesia with the patient or authorized representative who has indicated his/her understanding and acceptance.  ? ? ? ?Dental Advisory Given ? ?Plan Discussed with: CRNA ? ?Anesthesia Plan Comments:   ? ? ? ? ? ? ?Anesthesia Quick Evaluation ? ?

## 2022-02-16 NOTE — Transfer of Care (Signed)
Immediate Anesthesia Transfer of Care Note ? ?Patient: Greg Kemp ? ?Procedure(s) Performed: COLONOSCOPY WITH PROPOFOL ?ESOPHAGOGASTRODUODENOSCOPY (EGD) WITH PROPOFOL ?POLYPECTOMY INTESTINAL ? ?Patient Location: PACU ? ?Anesthesia Type:General ? ?Level of Consciousness: awake, alert  and oriented ? ?Airway & Oxygen Therapy: Patient Spontanous Breathing ? ?Post-op Assessment: Report given to RN, Post -op Vital signs reviewed and stable, Patient moving all extremities X 4 and Patient able to stick tongue midline ? ?Post vital signs: Reviewed ? ?Last Vitals:  ?Vitals Value Taken Time  ?BP 94/50   ?Temp 97.8   ?Pulse 63   ?Resp 19   ?SpO2 95   ? ? ?Last Pain:  ?Vitals:  ? 02/16/22 1154  ?PainSc: 0-No pain  ?   ? ?  ? ?Complications: No notable events documented. ?

## 2022-02-17 ENCOUNTER — Encounter: Payer: Self-pay | Admitting: Internal Medicine

## 2022-02-17 LAB — SURGICAL PATHOLOGY

## 2022-02-17 NOTE — Anesthesia Postprocedure Evaluation (Signed)
Anesthesia Post Note ? ?Patient: Greg Kemp ? ?Procedure(s) Performed: COLONOSCOPY WITH PROPOFOL ?ESOPHAGOGASTRODUODENOSCOPY (EGD) WITH PROPOFOL ?POLYPECTOMY INTESTINAL ? ?Patient location during evaluation: Phase II ?Anesthesia Type: General ?Level of consciousness: awake ?Pain management: pain level controlled ?Vital Signs Assessment: post-procedure vital signs reviewed and stable ?Respiratory status: spontaneous breathing and respiratory function stable ?Cardiovascular status: blood pressure returned to baseline and stable ?Postop Assessment: no headache and no apparent nausea or vomiting ?Anesthetic complications: no ?Comments: Late entry ? ? ?No notable events documented. ? ? ?Last Vitals:  ?Vitals:  ? 02/16/22 1115 02/16/22 1239  ?BP: 95/66 (!) 104/55  ?Pulse: 70 62  ?Resp: 18 18  ?Temp: 36.6 ?C 36.5 ?C  ?SpO2: 99% 95%  ?  ?Last Pain:  ?Vitals:  ? 02/16/22 1239  ?TempSrc: Oral  ?PainSc: 0-No pain  ? ? ?  ?  ?  ?  ?  ?  ? ?Louann Sjogren ? ? ? ? ?

## 2022-02-23 ENCOUNTER — Encounter (HOSPITAL_COMMUNITY): Payer: Self-pay | Admitting: Internal Medicine

## 2022-03-02 DIAGNOSIS — Z6841 Body Mass Index (BMI) 40.0 and over, adult: Secondary | ICD-10-CM | POA: Diagnosis not present

## 2022-03-02 DIAGNOSIS — M47812 Spondylosis without myelopathy or radiculopathy, cervical region: Secondary | ICD-10-CM | POA: Diagnosis not present

## 2022-03-13 DIAGNOSIS — M47812 Spondylosis without myelopathy or radiculopathy, cervical region: Secondary | ICD-10-CM | POA: Diagnosis not present

## 2022-04-26 ENCOUNTER — Other Ambulatory Visit (HOSPITAL_COMMUNITY): Payer: Self-pay | Admitting: Psychiatry

## 2022-04-27 NOTE — Telephone Encounter (Signed)
Call pt for appt 

## 2022-05-08 ENCOUNTER — Ambulatory Visit (INDEPENDENT_AMBULATORY_CARE_PROVIDER_SITE_OTHER): Payer: Medicare HMO | Admitting: Psychiatry

## 2022-05-08 ENCOUNTER — Encounter (HOSPITAL_COMMUNITY): Payer: Self-pay | Admitting: Psychiatry

## 2022-05-08 VITALS — BP 122/69 | HR 65 | Temp 97.9°F | Ht 71.5 in | Wt 299.0 lb

## 2022-05-08 DIAGNOSIS — F5105 Insomnia due to other mental disorder: Secondary | ICD-10-CM | POA: Diagnosis not present

## 2022-05-08 DIAGNOSIS — F419 Anxiety disorder, unspecified: Secondary | ICD-10-CM

## 2022-05-08 DIAGNOSIS — F332 Major depressive disorder, recurrent severe without psychotic features: Secondary | ICD-10-CM

## 2022-05-08 MED ORDER — TRAZODONE HCL 100 MG PO TABS
200.0000 mg | ORAL_TABLET | Freq: Every day | ORAL | 2 refills | Status: DC
Start: 2022-05-08 — End: 2022-08-08

## 2022-05-08 MED ORDER — DULOXETINE HCL 60 MG PO CPEP: 60.0000 mg | ORAL_CAPSULE | Freq: Every day | ORAL | 2 refills | Status: DC

## 2022-05-08 MED ORDER — ALPRAZOLAM 1 MG PO TABS: ORAL_TABLET | ORAL | 2 refills | Status: DC
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
## 2022-06-07 DIAGNOSIS — E039 Hypothyroidism, unspecified: Secondary | ICD-10-CM | POA: Diagnosis not present

## 2022-06-07 DIAGNOSIS — R7301 Impaired fasting glucose: Secondary | ICD-10-CM | POA: Diagnosis not present

## 2022-06-07 DIAGNOSIS — E782 Mixed hyperlipidemia: Secondary | ICD-10-CM | POA: Diagnosis not present

## 2022-06-14 DIAGNOSIS — M545 Low back pain, unspecified: Secondary | ICD-10-CM | POA: Diagnosis not present

## 2022-06-14 DIAGNOSIS — F3181 Bipolar II disorder: Secondary | ICD-10-CM | POA: Diagnosis not present

## 2022-06-14 DIAGNOSIS — E782 Mixed hyperlipidemia: Secondary | ICD-10-CM | POA: Diagnosis not present

## 2022-06-14 DIAGNOSIS — Z23 Encounter for immunization: Secondary | ICD-10-CM | POA: Diagnosis not present

## 2022-06-14 DIAGNOSIS — G4701 Insomnia due to medical condition: Secondary | ICD-10-CM | POA: Diagnosis not present

## 2022-06-14 DIAGNOSIS — E039 Hypothyroidism, unspecified: Secondary | ICD-10-CM | POA: Diagnosis not present

## 2022-06-14 DIAGNOSIS — F411 Generalized anxiety disorder: Secondary | ICD-10-CM | POA: Diagnosis not present

## 2022-06-14 DIAGNOSIS — K219 Gastro-esophageal reflux disease without esophagitis: Secondary | ICD-10-CM | POA: Diagnosis not present

## 2022-06-14 DIAGNOSIS — N1831 Chronic kidney disease, stage 3a: Secondary | ICD-10-CM | POA: Diagnosis not present

## 2022-07-05 NOTE — Progress Notes (Signed)
No show

## 2022-07-27 DIAGNOSIS — L821 Other seborrheic keratosis: Secondary | ICD-10-CM | POA: Diagnosis not present

## 2022-07-27 DIAGNOSIS — D1801 Hemangioma of skin and subcutaneous tissue: Secondary | ICD-10-CM | POA: Diagnosis not present

## 2022-07-27 DIAGNOSIS — D224 Melanocytic nevi of scalp and neck: Secondary | ICD-10-CM | POA: Diagnosis not present

## 2022-07-27 DIAGNOSIS — D485 Neoplasm of uncertain behavior of skin: Secondary | ICD-10-CM | POA: Diagnosis not present

## 2022-08-02 ENCOUNTER — Ambulatory Visit (HOSPITAL_COMMUNITY): Payer: Medicare HMO | Admitting: Psychiatry

## 2022-08-08 ENCOUNTER — Telehealth (HOSPITAL_COMMUNITY): Payer: Self-pay | Admitting: *Deleted

## 2022-08-08 ENCOUNTER — Other Ambulatory Visit (HOSPITAL_COMMUNITY): Payer: Self-pay | Admitting: Psychiatry

## 2022-08-08 MED ORDER — TRAZODONE HCL 100 MG PO TABS
200.0000 mg | ORAL_TABLET | Freq: Every day | ORAL | 2 refills | Status: DC
Start: 2022-08-08 — End: 2022-09-06

## 2022-08-08 NOTE — Telephone Encounter (Signed)
Per pt wife, patient is out of his Trazodone. Per pt wife, patient takes 2 tablets at night and it's working. Patient wife would like for Trazodone to be sent to [facility].

## 2022-08-08 NOTE — Telephone Encounter (Signed)
Sent, he needs appt

## 2022-08-10 ENCOUNTER — Telehealth (HOSPITAL_COMMUNITY): Payer: Medicare HMO | Admitting: Psychiatry

## 2022-08-28 ENCOUNTER — Other Ambulatory Visit (HOSPITAL_COMMUNITY): Payer: Self-pay | Admitting: Psychiatry

## 2022-09-06 ENCOUNTER — Encounter (HOSPITAL_COMMUNITY): Payer: Self-pay | Admitting: Psychiatry

## 2022-09-06 ENCOUNTER — Telehealth (INDEPENDENT_AMBULATORY_CARE_PROVIDER_SITE_OTHER): Payer: Medicare HMO | Admitting: Psychiatry

## 2022-09-06 DIAGNOSIS — F419 Anxiety disorder, unspecified: Secondary | ICD-10-CM

## 2022-09-06 DIAGNOSIS — F332 Major depressive disorder, recurrent severe without psychotic features: Secondary | ICD-10-CM

## 2022-09-06 MED ORDER — ALPRAZOLAM 1 MG PO TABS: ORAL_TABLET | ORAL | 2 refills | Status: DC

## 2022-09-06 MED ORDER — TRAZODONE HCL 100 MG PO TABS
200.0000 mg | ORAL_TABLET | Freq: Every day | ORAL | 2 refills | Status: DC
Start: 2022-09-06 — End: 2022-11-06

## 2022-09-06 MED ORDER — DULOXETINE HCL 60 MG PO CPEP: 60.0000 mg | ORAL_CAPSULE | Freq: Every day | ORAL | 1 refills | Status: DC
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
## 2022-10-01 IMAGING — MR MR CERVICAL SPINE W/O CM
4 of 5 series · 27 of 48 positions shown · non-contrast
Comparison: Cervical spine radiographs 09/09/2021.

CLINICAL DATA: 60-year-old male with neck stiffness. Bilateral
shoulder pain for 6 months. Facial numbness.

EXAM:
MRI CERVICAL SPINE WITHOUT CONTRAST
TECHNIQUE: Multiplanar, multisequence MR imaging of the cervical spine was
performed. No intravenous contrast was administered.

[Series 5: T2 · sagittal · 3.0mm · 0.55mm/px · 7 of 15 slices shown (1 of 2)]
[im 1/15]
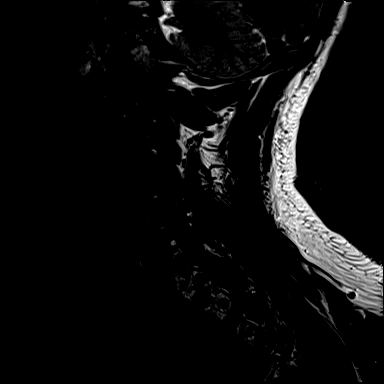
[im 3/15]
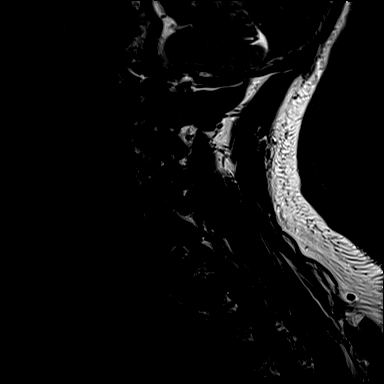
[im 5/15]
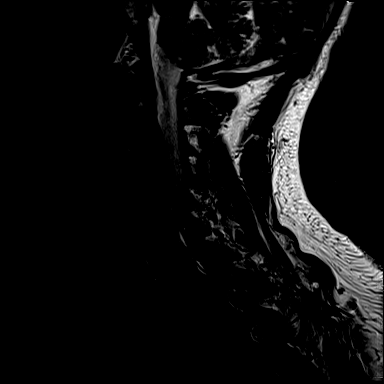
[im 8/15]
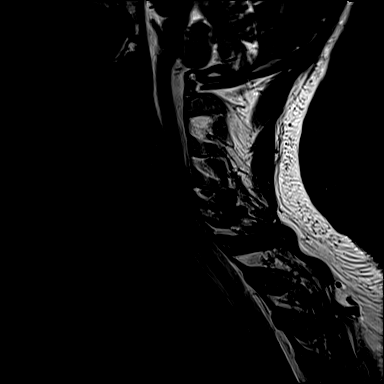
[im 10/15]
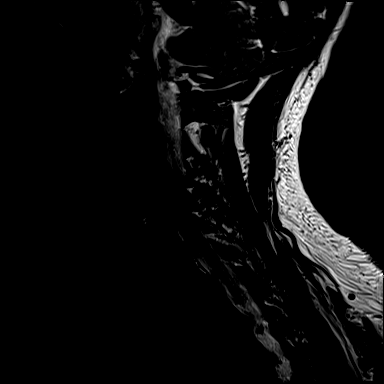
[im 12/15]
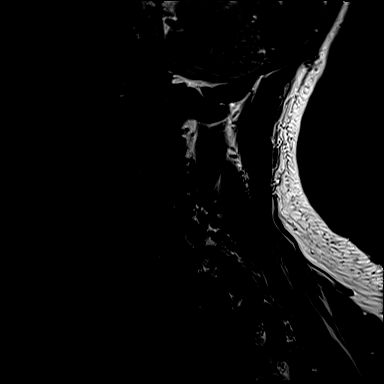
[im 15/15]
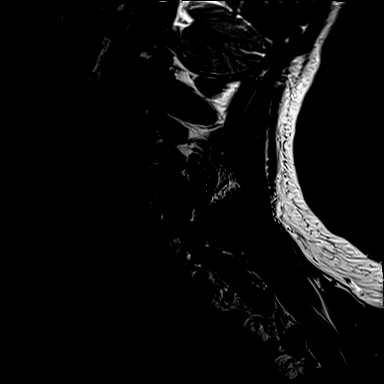

[Series 6: T1 · sagittal · 3.0mm · 0.66mm/px · 7 of 15 slices shown]
[im 1/15]
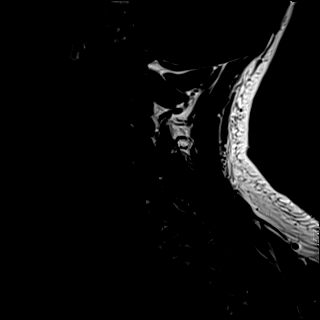
[im 3/15]
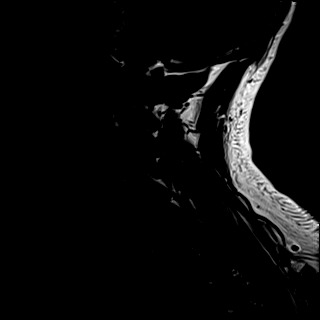
[im 5/15]
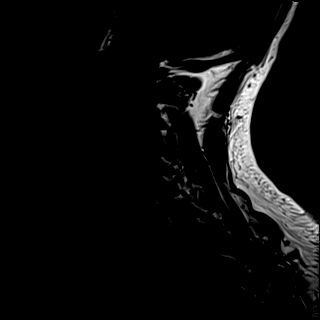
[im 8/15]
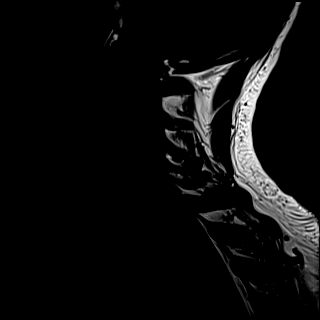
[im 10/15]
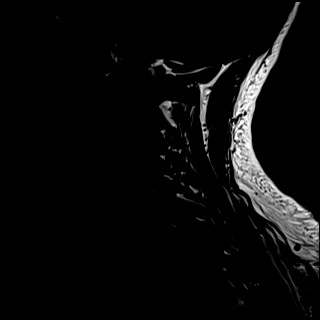
[im 12/15]
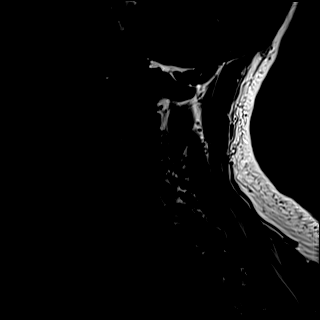
[im 15/15]
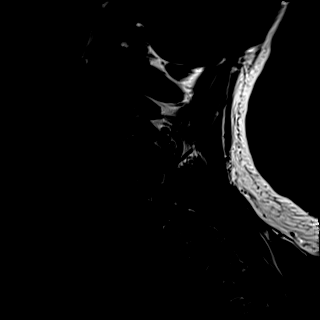

[Series 7: STIR · sagittal · 3.0mm · 0.33mm/px · 5 of 15 slices shown]
[im 1/15]
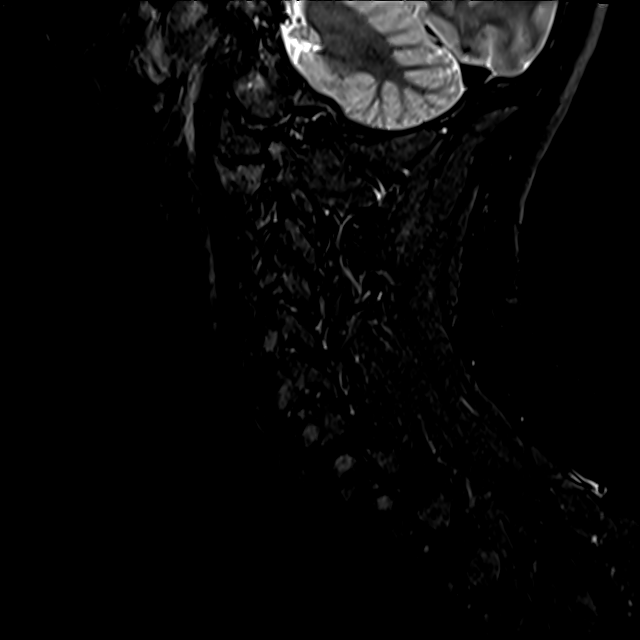
[im 3/15]
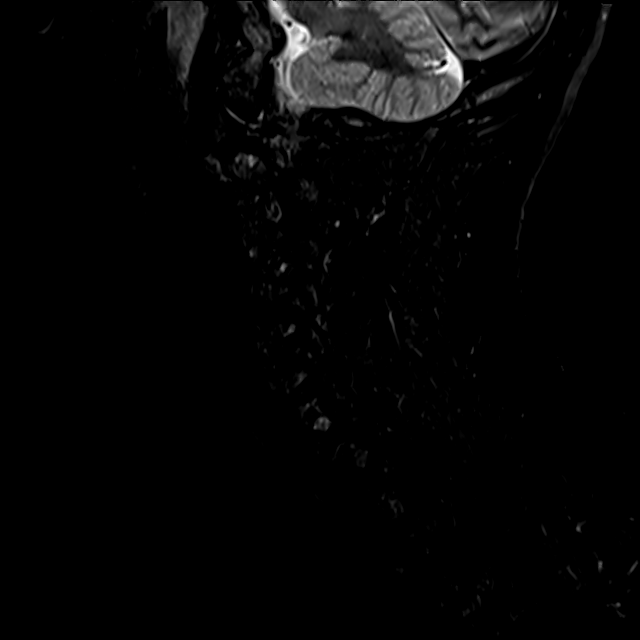
[im 6/15]
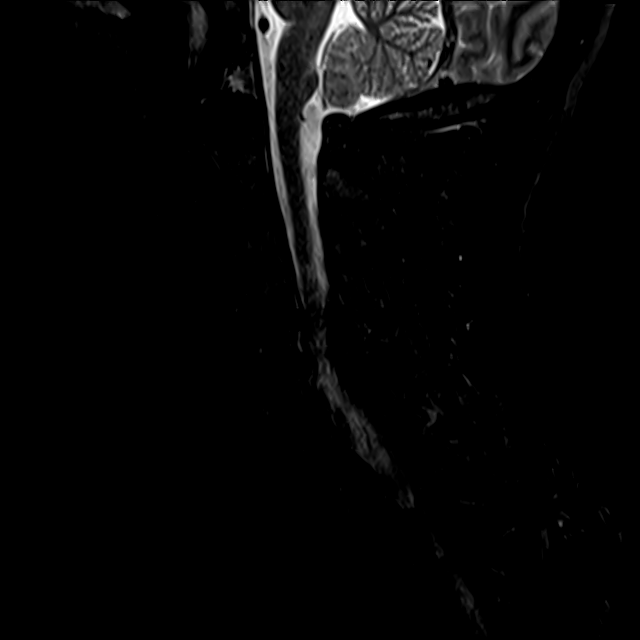
[im 9/15]
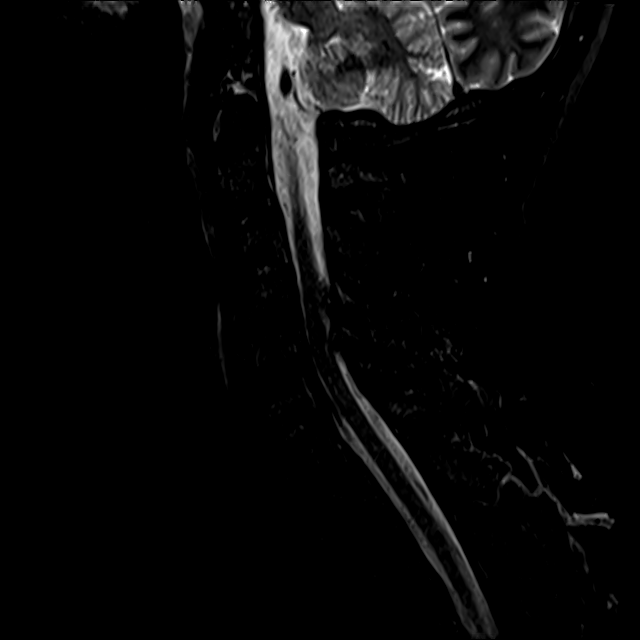
[im 15/15]
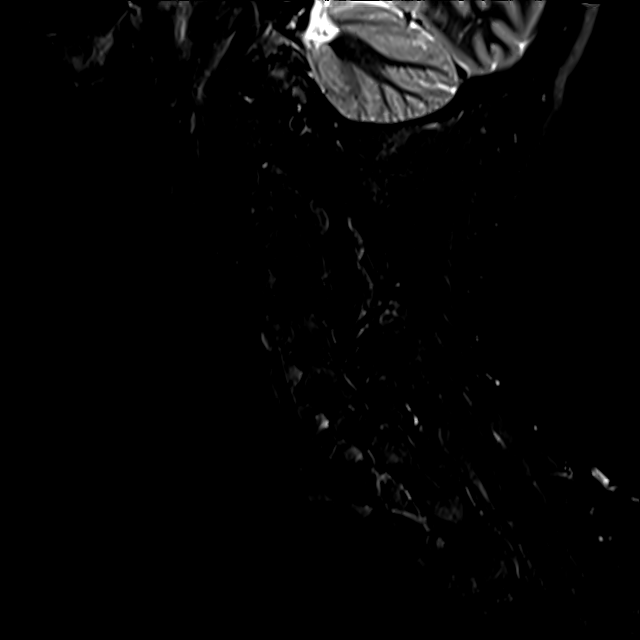

[Series 8: T2 · axial · 3.0mm · 0.50mm/px · z∈[-116,-10]mm · 8 of 34 slices shown (2 of 2)]
[im 1/34]
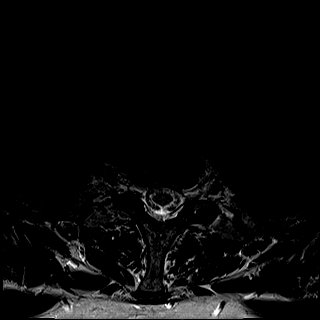
[im 6/34]
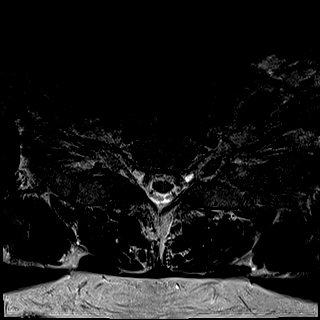
[im 11/34]
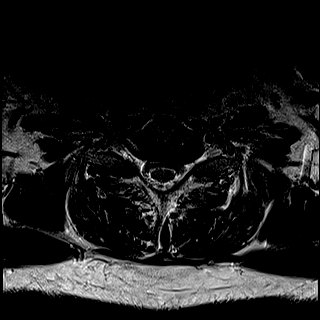
[im 16/34]
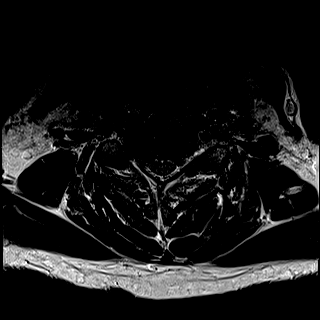
[im 18/34]
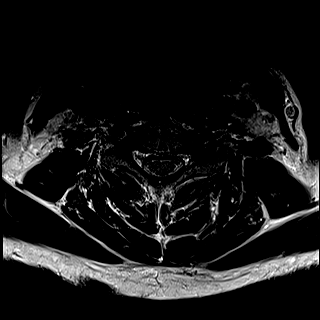
[im 23/34]
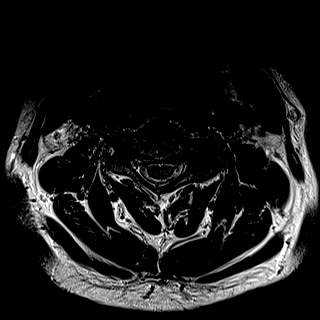
[im 28/34]
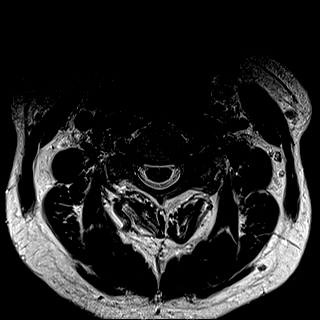
[im 34/34]
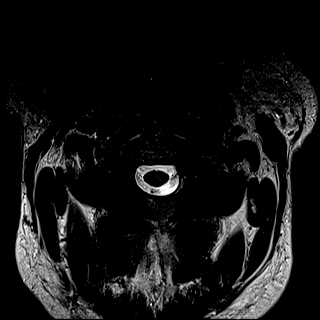

[27 of 48 positions shown; findings below may reference images not displayed]

FINDINGS: Alignment: Stable mild straightening of cervical lordosis since last
year.

Vertebrae: No marrow edema or evidence of acute osseous abnormality.
Visualized bone marrow signal is within normal limits.

Cord: Normal. Negative visible upper thoracic spinal cord and canal.

Posterior Fossa, vertebral arteries, paraspinal tissues:
Cervicomedullary junction is within normal limits. Negative visible
posterior fossa. Preserved major vascular flow voids in the neck,
the left vertebral artery appears dominant (normal variant).
Negative visible neck soft tissues and lung apices.

Disc levels:

C2-C3: Negative disc. Mild to moderate facet hypertrophy on the
left. Borderline to mild left C3 foraminal stenosis.

C3-C4: Negative disc. Moderate facet hypertrophy on the left. Mild
to moderate left C4 foraminal stenosis.

C4-C5: Mild circumferential disc osteophyte complex. Broad-based
posterior and foraminal involvement with mild to moderate facet and
ligament flavum hypertrophy. Mild spinal stenosis. No convincing
cord mass effect. Moderate to severe left, moderate to severe right
greater than left C5 foraminal stenosis.

C5-C6: Circumferential disc osteophyte complex eccentric to the
right. No spinal stenosis. Mild left, severe right C6 foraminal
stenosis.

C6-C7: Foraminal disc bulging and endplate spurring on the left.
Mild facet hypertrophy. Moderate left C7 foraminal stenosis.

C7-T1:  Mild to moderate facet hypertrophy. No stenosis.
IMPRESSION: Intermittent cervical spine degeneration.
Disc, endplate, and posterior element degeneration with mild spinal
stenosis at C4-C5 and up to severe right > left C5 nerve level
foraminal stenosis.
Moderate left neural foraminal stenosis at C6-C7.

## 2022-10-20 ENCOUNTER — Other Ambulatory Visit (HOSPITAL_COMMUNITY): Payer: Self-pay | Admitting: Psychiatry

## 2022-11-06 ENCOUNTER — Encounter (HOSPITAL_COMMUNITY): Payer: Self-pay | Admitting: Psychiatry

## 2022-11-06 ENCOUNTER — Telehealth (INDEPENDENT_AMBULATORY_CARE_PROVIDER_SITE_OTHER): Payer: Medicare HMO | Admitting: Psychiatry

## 2022-11-06 DIAGNOSIS — F419 Anxiety disorder, unspecified: Secondary | ICD-10-CM | POA: Diagnosis not present

## 2022-11-06 DIAGNOSIS — F332 Major depressive disorder, recurrent severe without psychotic features: Secondary | ICD-10-CM

## 2022-11-06 MED ORDER — TRAZODONE HCL 100 MG PO TABS: 200.0000 mg | ORAL_TABLET | Freq: Every day | ORAL | 2 refills | Status: DC

## 2022-11-06 MED ORDER — DULOXETINE HCL 60 MG PO CPEP: 60.0000 mg | ORAL_CAPSULE | Freq: Every day | ORAL | 1 refills | Status: DC

## 2022-11-06 MED ORDER — ALPRAZOLAM 1 MG PO TABS: ORAL_TABLET | ORAL | 2 refills | Status: DC

## 2022-11-06 NOTE — Progress Notes (Signed)
Virtual Visit via Video Note  I connected with Greg Kemp on 11/06/22 at 11:20 AM EST by a video enabled telemedicine application and verified that I am speaking with the correct person using two identifiers.  Location: Patient: home Provider: office   I discussed the limitations of evaluation and management by telemedicine and the availability of in person appointments. The patient expressed understanding and agreed to proceed.     I discussed the assessment and treatment plan with the patient. The patient was provided an opportunity to ask questions and all were answered. The patient agreed with the plan and demonstrated an understanding of the instructions.   The patient was advised to call back or seek an in-person evaluation if the symptoms worsen or if the condition fails to improve as anticipated.  I provided 15 minutes of non-face-to-face time during this encounter.   Greg Spiller, MD  [facility] MD/PA/NP OP Progress Note  11/06/2022 11:35 AM Greg Kemp  MRN:  [id number]  Chief Complaint:  Chief Complaint  Patient presents with   Depression   Anxiety   Follow-up   HPI: This patient is a 63 year old white male lives with his wife in [place].  He is on disability.  The patient returns for follow-up after 2 months.  He states he has been doing a little bit better.  He was sad because one of his adult daughters has been estranged from the family.  She did not participate in any other holiday activities but he thinks it is for the best.  Having her around causes a lot of stress.  He is resigned to the fact that it may take time to repair the relationship.  Right now he denies significant depression thoughts of self-harm or suicide.  His anxiety is well-controlled with the Xanax.  He is trying to stay busy helping his other kids with babysitting.  He is sleeping well with the trazodone. Visit Diagnosis:    ICD-10-CM   1. Major depressive disorder, recurrent, severe  without psychotic features ([place])  F33.2     2. Anxiety  F41.9       Past Psychiatric History: Last hospitalization was in 2012  Past Medical History:  Past Medical History:  Diagnosis Date   Acid reflux    Anxiety    Cervical spinal stenosis    Chronic kidney disease    Stage III   Depression    Depression    GERD (gastroesophageal reflux disease)    Hyperlipidemia    Morbid obesity ([place])    Sleep apnea    Thyroid disease     Past Surgical History:  Procedure Laterality Date   COLONOSCOPY WITH ESOPHAGOGASTRODUODENOSCOPY (EGD)  2010   Dr. Gala Romney: ulcerative/erosive reflux esophagitis, noncritical schatki's ring, multiple bulbar erosions with normal sb biopsies, colonic diverticulosis, normal TI   COLONOSCOPY WITH PROPOFOL N/A 10/30/2019   Procedure: COLONOSCOPY WITH PROPOFOL;  Surgeon: Daneil Dolin, MD;  [facility];  Service: Endoscopy;  Laterality: N/A;  1:30pm   COLONOSCOPY WITH PROPOFOL N/A 02/16/2022   Procedure: COLONOSCOPY WITH PROPOFOL;  Surgeon: Daneil Dolin, MD;  [facility];  Service: Endoscopy;  Laterality: N/A;  12:00pm   ESOPHAGOGASTRODUODENOSCOPY (EGD) WITH PROPOFOL N/A 02/16/2022   Procedure: ESOPHAGOGASTRODUODENOSCOPY (EGD) WITH PROPOFOL;  Surgeon: Daneil Dolin, MD;  [facility];  Service: Endoscopy;  Laterality: N/A;   POLYPECTOMY  10/30/2019   Procedure: POLYPECTOMY;  Surgeon: Daneil Dolin, MD;  [facility];  Service: Endoscopy;;   POLYPECTOMY  02/16/2022   Procedure: POLYPECTOMY INTESTINAL;  Surgeon: Daneil Dolin, MD;  [facility];  Service: Endoscopy;;   TONSILLECTOMY     Upper and Lower GI      Family Psychiatric History: See below  Family History:  Family History  Problem Relation Age of Onset   Alcohol abuse Father    Colon cancer Father    COPD Father    Depression Daughter        acute   Sexual abuse Daughter    Lung disease Mother    COPD Mother    Thyroid disease  Mother    Alcohol abuse Paternal Uncle    Colon cancer Daughter 31   ADD / ADHD Neg Hx    Anxiety disorder Neg Hx    OCD Neg Hx    Drug abuse Neg Hx    Bipolar disorder Neg Hx    Dementia Neg Hx    Paranoid behavior Neg Hx    Schizophrenia Neg Hx    Seizures Neg Hx    Physical abuse Neg Hx     Social History:  Social History   Socioeconomic History   Marital status: Married    Spouse name: Not on file   Number of children: Not on file   Years of education: Not on file   Highest education level: Not on file  Occupational History   Not on file  Tobacco Use   Smoking status: Never   Smokeless tobacco: Never  Substance and Sexual Activity   Alcohol use: Not Currently    Comment: occ; update 10/26 "I don't drink anymore"   Drug use: No   Sexual activity: Yes    Birth control/protection: Surgical  Other Topics Concern   Not on file  Social History Narrative   Lives at home with wife   Caffeine: coffee 1 large cup/day   Social Determinants of Health   Financial Resource Strain: Not on file  Food Insecurity: Not on file  Transportation Needs: Not on file  Physical Activity: Not on file  Stress: Not on file  Social Connections: Not on file    Allergies: No Known Allergies  Metabolic Disorder Labs: No results found for: "HGBA1C", "MPG" No results found for: "PROLACTIN" Lab Results  Component Value Date   CHOL  06/16/2010    166        ATP III CLASSIFICATION:  <200     mg/dL   Desirable  200-239  mg/dL   Borderline High  >=240    mg/dL   High          TRIG 166 (H) 06/16/2010   HDL 29 (L) 06/16/2010   CHOLHDL 5.7 06/16/2010   VLDL 33 06/16/2010   LDLCALC (H) 06/16/2010    104        Total Cholesterol/HDL:CHD Risk Coronary Heart Disease Risk Table                     Men   Women  1/2 Average Risk   3.4   3.3  Average Risk       5.0   4.4  2 X Average Risk   9.6   7.1  3 X Average Risk  23.4   11.0        Use the calculated Patient Ratio above and the  CHD Risk Table to determine the patient's CHD Risk.        ATP III CLASSIFICATION (  LDL):  <100     mg/dL   Optimal  100-129  mg/dL   Near or Above                    Optimal  130-159  mg/dL   Borderline  160-189  mg/dL   High  >190     mg/dL   Very High   Lab Results  Component Value Date   TSH 11.645 (H) 12/14/2010   TSH 0.264 (L) 06/16/2010    Therapeutic Level Labs: No results found for: "LITHIUM" No results found for: "VALPROATE" No results found for: "CBMZ"  Current Medications: Current Outpatient Medications  Medication Sig Dispense Refill   ALPRAZolam (XANAX) 1 MG tablet TAKE 1 TABLET FOUR TIMES DAILY 120 tablet 2   amLODipine (NORVASC) 5 MG tablet Take 5 mg by mouth at bedtime.     cyclobenzaprine (FLEXERIL) 10 MG tablet Take 10 mg by mouth 2 (two) times daily as needed for muscle spasms.     DULoxetine (CYMBALTA) 60 MG capsule Take 1 capsule (60 mg total) by mouth daily. 90 capsule 1   gabapentin (NEURONTIN) 300 MG capsule Take 300 mg by mouth 3 (three) times daily as needed (pain).     levothyroxine (SYNTHROID) 150 MCG tablet Take 150 mcg by mouth daily before breakfast.     Multiple Vitamin (MULTIVITAMIN WITH MINERALS) TABS tablet Take 1 tablet by mouth daily.     olmesartan (BENICAR) 40 MG tablet Take 40 mg by mouth daily.     pantoprazole (PROTONIX) 40 MG tablet Take 40 mg by mouth daily.     traZODone (DESYREL) 100 MG tablet Take 2 tablets (200 mg total) by mouth at bedtime. 180 tablet 2   No current facility-administered medications for this visit.     Musculoskeletal: Strength & Muscle Tone: within normal limits Gait & Station: normal Patient leans: N/A  Psychiatric Specialty Exam: Review of Systems  All other systems reviewed and are negative.   There were no vitals taken for this visit.There is no height or weight on file to calculate BMI.  General Appearance: Casual and Fairly Groomed  Eye Contact:  Good  Speech:  Clear and Coherent  Volume:   Normal  Mood:  Euthymic  Affect:  Flat  Thought Process:  Goal Directed  Orientation:  Full (Time, Place, and Person)  Thought Content: Rumination   Suicidal Thoughts:  No  Homicidal Thoughts:  No  Memory:  Immediate;   Good Recent;   Good Remote;   Good  Judgement:  Good  Insight:  Good  Psychomotor Activity:  Decreased  Concentration:  Concentration: Good and Attention Span: Good  Recall:  Good  Fund of Knowledge: Good  Language: Good  Akathisia:  No  Handed:  Right  AIMS (if indicated): not done  Assets:  Communication Skills Desire for Improvement Physical Health Resilience Social Support Talents/Skills  ADL's:  Intact  Cognition: WNL  Sleep:  Good   Screenings: Mini-Mental    [place] Office Visit from 12/01/2020 in [facility]  Total Score (max 30 points ) 24      PHQ2-9    Flowsheet Row Video Visit from 11/06/2022 in [place] [facility] [place] Video Visit from 09/06/2022 in [facility] [place] Visit from 05/08/2022 in [facility] [place] Video Visit from 12/19/2021 in [facility] [place] Video Visit from 08/19/2021 in [facility] Total Score 0 2 0 0 0  PHQ-9 Total Score -- 9 -- -- --      Flowsheet Row Video Visit from 11/06/2022 in [facility] [place] from 05/08/2022 in [place] [facility] [place] Admission (Discharged) from 02/16/2022 in [place] No Risk No Risk No Risk        Assessment and Plan: This patient is a 62 year old male with a history of PTSD and anxiety.  He is doing well on his current regimen.  He will continue Cymbalta 60 mg daily for depression Xanax 1 mg 4 times daily as needed for anxiety and trazodone 200 mg at  bedtime for sleep.  He will return to see me at 3 months.  Collaboration of Care: Collaboration of Care: Primary Care Provider AEB notes will be shared with PCP at patient's request  Patient/Guardian was advised Release of Information must be obtained prior to any record release in order to collaborate their care with an outside provider. Patient/Guardian was advised if they have not already done so to contact the registration department to sign all necessary forms in order for [place] to release information regarding their care.   Consent: Patient/Guardian gives verbal consent for treatment and assignment of benefits for services provided during this visit. Patient/Guardian expressed understanding and agreed to proceed.    Greg Spiller, MD 11/06/2022, 11:35 AM

## 2022-12-06 ENCOUNTER — Ambulatory Visit: Payer: Medicare HMO | Admitting: Adult Health

## 2022-12-08 DIAGNOSIS — E782 Mixed hyperlipidemia: Secondary | ICD-10-CM | POA: Diagnosis not present

## 2022-12-08 DIAGNOSIS — D509 Iron deficiency anemia, unspecified: Secondary | ICD-10-CM | POA: Diagnosis not present

## 2022-12-08 DIAGNOSIS — R7301 Impaired fasting glucose: Secondary | ICD-10-CM | POA: Diagnosis not present

## 2022-12-08 DIAGNOSIS — E039 Hypothyroidism, unspecified: Secondary | ICD-10-CM | POA: Diagnosis not present

## 2022-12-13 ENCOUNTER — Other Ambulatory Visit (HOSPITAL_COMMUNITY): Payer: Self-pay | Admitting: Internal Medicine

## 2022-12-13 DIAGNOSIS — E782 Mixed hyperlipidemia: Secondary | ICD-10-CM

## 2022-12-13 DIAGNOSIS — I129 Hypertensive chronic kidney disease with stage 1 through stage 4 chronic kidney disease, or unspecified chronic kidney disease: Secondary | ICD-10-CM | POA: Diagnosis not present

## 2022-12-13 DIAGNOSIS — E039 Hypothyroidism, unspecified: Secondary | ICD-10-CM | POA: Diagnosis not present

## 2022-12-13 DIAGNOSIS — G4700 Insomnia, unspecified: Secondary | ICD-10-CM | POA: Diagnosis not present

## 2022-12-13 DIAGNOSIS — Z0001 Encounter for general adult medical examination with abnormal findings: Secondary | ICD-10-CM | POA: Diagnosis not present

## 2022-12-13 DIAGNOSIS — F332 Major depressive disorder, recurrent severe without psychotic features: Secondary | ICD-10-CM | POA: Diagnosis not present

## 2022-12-13 DIAGNOSIS — M545 Low back pain, unspecified: Secondary | ICD-10-CM | POA: Diagnosis not present

## 2022-12-13 DIAGNOSIS — N1831 Chronic kidney disease, stage 3a: Secondary | ICD-10-CM | POA: Diagnosis not present

## 2022-12-13 DIAGNOSIS — F411 Generalized anxiety disorder: Secondary | ICD-10-CM | POA: Diagnosis not present

## 2022-12-25 ENCOUNTER — Other Ambulatory Visit (HOSPITAL_COMMUNITY): Payer: Self-pay | Admitting: Psychiatry

## 2023-02-05 ENCOUNTER — Encounter (HOSPITAL_COMMUNITY): Payer: Self-pay | Admitting: Psychiatry

## 2023-02-05 ENCOUNTER — Ambulatory Visit (HOSPITAL_COMMUNITY): Payer: Medicare HMO | Admitting: Psychiatry

## 2023-02-05 VITALS — BP 170/84 | HR 59 | Ht 71.5 in | Wt 313.0 lb

## 2023-02-05 DIAGNOSIS — F332 Major depressive disorder, recurrent severe without psychotic features: Secondary | ICD-10-CM

## 2023-02-05 DIAGNOSIS — F5105 Insomnia due to other mental disorder: Secondary | ICD-10-CM

## 2023-02-05 DIAGNOSIS — F419 Anxiety disorder, unspecified: Secondary | ICD-10-CM

## 2023-02-05 MED ORDER — ALPRAZOLAM 1 MG PO TABS: ORAL_TABLET | ORAL | 2 refills | Status: DC

## 2023-02-05 MED ORDER — DULOXETINE HCL 60 MG PO CPEP: 60.0000 mg | ORAL_CAPSULE | Freq: Every day | ORAL | 1 refills | Status: DC

## 2023-02-05 MED ORDER — TRAZODONE HCL 100 MG PO TABS: 200.0000 mg | ORAL_TABLET | Freq: Every day | ORAL | 2 refills | Status: DC

## 2023-02-05 NOTE — Progress Notes (Signed)
BH MD/PA/NP OP Progress Note  02/05/2023 2:35 PM Greg Kemp  MRN:  [id number]  Chief Complaint:  Chief Complaint  Patient presents with   Depression   Anxiety   Follow-up   HPI: This patient is a 62 year old white male who lives with his wife in [place].  He is on disability.  The patient and his wife return for follow-up after 3 months.  This is regarding his depression and anxiety.  He admits that for several weeks he was not taking his medications consistently.  However his wife states that he was taking the Xanax and sometimes took too much.  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  I explained to him that it is very important for him to take his medicines daily particularly the levothyroxine and he voices understanding.  He and his wife are getting a new pill organizer and putting alarm on his phone so he is sure not to miss his medications.  His mood is starting to come up a bit.  He wants to plan something enjoyable for the summer with his wife.  He is sleeping fairly well.  He denies any thoughts of suicide or self-harm Visit Diagnosis:    ICD-10-CM   1. Major depressive disorder, recurrent, severe without psychotic features (HCC)  F33.2     2. Anxiety  F41.9     3. Insomnia due to mental disorder  F51.05       Past Psychiatric History: Last  psychiatric hospitalization was in 2012  Past Medical History:  Past Medical History:  Diagnosis Date   Acid reflux    Anxiety    Cervical spinal stenosis    Chronic kidney disease    Stage III   Depression    Depression    GERD (gastroesophageal reflux disease)    Hyperlipidemia    Morbid obesity (HCC)    Sleep apnea    Thyroid disease     Past Surgical History:  Procedure Laterality Date   COLONOSCOPY WITH ESOPHAGOGASTRODUODENOSCOPY (EGD)  2010   Dr. Jena Gauss: ulcerative/erosive reflux esophagitis, noncritical schatki's ring, multiple bulbar erosions with normal  sb biopsies, colonic diverticulosis, normal TI   COLONOSCOPY WITH PROPOFOL N/A 10/30/2019   Procedure: COLONOSCOPY WITH PROPOFOL;  Surgeon: Corbin Ade, MD;  [facility];  Service: Endoscopy;  Laterality: N/A;  1:30pm   COLONOSCOPY WITH PROPOFOL N/A 02/16/2022   Procedure: COLONOSCOPY WITH PROPOFOL;  Surgeon: Corbin Ade, MD;  [facility];  Service: Endoscopy;  Laterality: N/A;  12:00pm   ESOPHAGOGASTRODUODENOSCOPY (EGD) WITH PROPOFOL N/A 02/16/2022   Procedure: ESOPHAGOGASTRODUODENOSCOPY (EGD) WITH PROPOFOL;  Surgeon: Corbin Ade, MD;  [facility];  Service: Endoscopy;  Laterality: N/A;   POLYPECTOMY  10/30/2019   Procedure: POLYPECTOMY;  Surgeon: Corbin Ade, MD;  [facility];  Service: Endoscopy;;   POLYPECTOMY  02/16/2022   Procedure: POLYPECTOMY INTESTINAL;  Surgeon: Corbin Ade, MD;  [facility];  Service: Endoscopy;;   TONSILLECTOMY     Upper and Lower GI      Family Psychiatric History: See below  Family History:  Family History  Problem Relation Age of Onset   Alcohol abuse Father    Colon cancer Father    COPD Father    Depression Daughter        acute   Sexual abuse Daughter    Lung disease Mother  COPD Mother    Thyroid disease Mother    Alcohol abuse Paternal Uncle    Colon cancer Daughter 78   ADD / ADHD Neg Hx    Anxiety disorder Neg Hx    OCD Neg Hx    Drug abuse Neg Hx    Bipolar disorder Neg Hx    Dementia Neg Hx    Paranoid behavior Neg Hx    Schizophrenia Neg Hx    Seizures Neg Hx    Physical abuse Neg Hx     Social History:  Social History   Socioeconomic History   Marital status: Married    Spouse name: Not on file   Number of children: Not on file   Years of education: Not on file   Highest education level: Not on file  Occupational History   Not on file  Tobacco Use   Smoking status: Never   Smokeless tobacco: Never  Substance and Sexual Activity   Alcohol  use: Not Currently    Comment: occ; update 10/26 "I don't drink anymore"   Drug use: No   Sexual activity: Yes    Birth control/protection: Surgical  Other Topics Concern   Not on file  Social History Narrative   Lives at home with wife   Caffeine: coffee 1 large cup/day   Social Determinants of Health   Financial Resource Strain: Not on file  Food Insecurity: Not on file  Transportation Needs: Not on file  Physical Activity: Not on file  Stress: Not on file  Social Connections: Not on file    Allergies: No Known Allergies  Metabolic Disorder Labs: No results found for: "HGBA1C", "MPG" No results found for: "PROLACTIN" Lab Results  Component Value Date   CHOL  06/16/2010    166        ATP III CLASSIFICATION:  <200     mg/dL   Desirable  161-096  mg/dL   Borderline High  >=045    mg/dL   High          TRIG 409 (H) 06/16/2010   HDL 29 (L) 06/16/2010   CHOLHDL 5.7 06/16/2010   VLDL 33 06/16/2010   LDLCALC (H) 06/16/2010    104        Total Cholesterol/HDL:CHD Risk Coronary Heart Disease Risk Table                     Men   Women  1/2 Average Risk   3.4   3.3  Average Risk       5.0   4.4  2 X Average Risk   9.6   7.1  3 X Average Risk  23.4   11.0        Use the calculated Patient Ratio above and the CHD Risk Table to determine the patient's CHD Risk.        ATP III CLASSIFICATION (LDL):  <100     mg/dL   Optimal  811-914  mg/dL   Near or Above                    Optimal  130-159  mg/dL   Borderline  782-956  mg/dL   High  >213     mg/dL   Very High   Lab Results  Component Value Date   TSH 11.645 (H) 12/14/2010   TSH 0.264 (L) 06/16/2010    Therapeutic Level Labs: No results found for: "LITHIUM" No results found for: "VALPROATE" No results  found for: "CBMZ"  Current Medications: Current Outpatient Medications  Medication Sig Dispense Refill   amLODipine (NORVASC) 5 MG tablet Take 5 mg by mouth at bedtime.     cyclobenzaprine (FLEXERIL) 10 MG  tablet Take 10 mg by mouth 2 (two) times daily as needed for muscle spasms.     gabapentin (NEURONTIN) 300 MG capsule Take 300 mg by mouth 3 (three) times daily as needed (pain).     levothyroxine (SYNTHROID) 150 MCG tablet Take 150 mcg by mouth daily before breakfast.     Multiple Vitamin (MULTIVITAMIN WITH MINERALS) TABS tablet Take 1 tablet by mouth daily.     olmesartan (BENICAR) 40 MG tablet Take 40 mg by mouth daily.     pantoprazole (PROTONIX) 40 MG tablet Take 40 mg by mouth daily.     ALPRAZolam (XANAX) 1 MG tablet TAKE 1 TABLET FOUR TIMES DAILY 120 tablet 2   DULoxetine (CYMBALTA) 60 MG capsule Take 1 capsule (60 mg total) by mouth daily. 90 capsule 1   traZODone (DESYREL) 100 MG tablet Take 2 tablets (200 mg total) by mouth at bedtime. 180 tablet 2   No current facility-administered medications for this visit.     Musculoskeletal: Strength & Muscle Tone: within normal limits Gait & Station: normal Patient leans: N/A  Psychiatric Specialty Exam: Review of Systems  Psychiatric/Behavioral:  Positive for dysphoric mood.   All other systems reviewed and are negative.   Blood pressure (!) 170/84, pulse (!) 59, height 5' 11.5" (1.816 m), weight (!) 313 lb (142 kg), SpO2 96 %.Body mass index is 43.05 kg/m.  General Appearance: Casual and Fairly Groomed  Eye Contact:  Good  Speech:  Clear and Coherent  Volume:  Normal  Mood:  Dysphoric  Affect:  Flat  Thought Process:  Goal Directed  Orientation:  Full (Time, Place, and Person)  Thought Content: Rumination   Suicidal Thoughts:  No  Homicidal Thoughts:  No  Memory:  Immediate;   Good Recent;   Good Remote;   NA  Judgement:  Fair  Insight:  Fair  Psychomotor Activity:  Decreased  Concentration:  Concentration: Good and Attention Span: Good  Recall:  Good  Fund of Knowledge: Good  Language: Good  Akathisia:  No  Handed:  Right  AIMS (if indicated): not done  Assets:  Communication Skills Desire for  Improvement Resilience Social Support Talents/Skills  ADL's:  Intact  Cognition: WNL  Sleep:  Fair   Screenings: Mini-Mental    Flowsheet Row Office Visit from 12/01/2020 in [facility]  Total Score (max 30 points ) 24      PHQ2-9    Flowsheet Row Video Visit from 11/06/2022 in [facility] [place] Video Visit from 09/06/2022 in [facility] [place] Office Visit from 05/08/2022 in [facility] [place] Video Visit from 12/19/2021 in [facility] at [place] Video Visit from 08/19/2021 in [facility] Total Score 0 2 0 0 0  PHQ-9 Total Score -- 9 -- -- --      Flowsheet Row Video Visit from 11/06/2022 in [facility] [place] Office Visit from 05/08/2022 in [facility] [place] Admission (Discharged) from 02/16/2022 in [facility]  C-SSRS RISK CATEGORY No Risk No Risk No Risk        Assessment and Plan: Patient is a 62 year old male with  a history of PTSD and anxiety.  Unfortunately he has gotten his off his medications and now is feeling more depressed.  He is on board with staying on top of this so he will restart Cymbalta 60 mg daily for depression, Xanax 1 mg 4 times daily as needed for anxiety and trazodone 200 mg at bedtime for sleep.  I urged him to stay on the lower end of the Xanax is much as possible.  He will return to see me in 6 weeks  Collaboration of Care: Collaboration of Care: Primary Care Provider AEB notes will be shared with PCP at patient's request  Patient/Guardian was advised Release of Information must be obtained prior to any record release in order to collaborate their care with an outside provider. Patient/Guardian was advised if they have not already done so to contact the  registration department to sign all necessary forms in order for [place] to release information regarding their care.   Consent: Patient/Guardian gives verbal consent for treatment and assignment of benefits for services provided during this visit. Patient/Guardian expressed understanding and agreed to proceed.    Diannia Ruder, MD 02/05/2023, 2:35 PM

## 2023-02-07 ENCOUNTER — Other Ambulatory Visit (HOSPITAL_COMMUNITY): Payer: Self-pay | Admitting: Psychiatry

## 2023-02-19 ENCOUNTER — Ambulatory Visit (HOSPITAL_COMMUNITY)
Admission: RE | Admit: 2023-02-19 | Discharge: 2023-02-19 | Disposition: A | Discharge status: Home / Self Care | Payer: Medicare HMO | Source: Ambulatory Visit | Attending: Internal Medicine | Admitting: Internal Medicine

## 2023-02-19 DIAGNOSIS — E782 Mixed hyperlipidemia: Secondary | ICD-10-CM | POA: Insufficient documentation

## 2023-02-27 DIAGNOSIS — M47812 Spondylosis without myelopathy or radiculopathy, cervical region: Secondary | ICD-10-CM | POA: Diagnosis not present

## 2023-03-20 ENCOUNTER — Ambulatory Visit (HOSPITAL_COMMUNITY): Payer: Medicare HMO | Admitting: Psychiatry

## 2023-04-04 ENCOUNTER — Ambulatory Visit (HOSPITAL_COMMUNITY): Payer: Medicare HMO | Admitting: Psychiatry

## 2023-04-04 ENCOUNTER — Encounter (HOSPITAL_COMMUNITY): Payer: Self-pay | Admitting: Psychiatry

## 2023-04-04 VITALS — BP 133/72 | HR 65 | Ht 71.0 in | Wt 295.0 lb

## 2023-04-04 DIAGNOSIS — F419 Anxiety disorder, unspecified: Secondary | ICD-10-CM

## 2023-04-04 DIAGNOSIS — F332 Major depressive disorder, recurrent severe without psychotic features: Secondary | ICD-10-CM

## 2023-04-04 MED ORDER — TRAZODONE HCL 100 MG PO TABS: 200.0000 mg | ORAL_TABLET | Freq: Every day | ORAL | 2 refills | Status: DC

## 2023-04-04 MED ORDER — DULOXETINE HCL 60 MG PO CPEP: 60.0000 mg | ORAL_CAPSULE | Freq: Every day | ORAL | 1 refills | Status: DC

## 2023-04-04 MED ORDER — ALPRAZOLAM 1 MG PO TABS: ORAL_TABLET | ORAL | 2 refills | Status: DC

## 2023-04-04 NOTE — Progress Notes (Signed)
BH MD/PA/NP OP Progress Note  04/04/2023 11:31 AM OLUWASEYI Kemp  MRN:  [id number]  Chief Complaint:  Chief Complaint  Patient presents with   Depression   Anxiety   Follow-up   HPI:  This patient is a 62 year old white male who lives with his wife in [place].  He is on disability   The patient and his wife return to follow-up after 2 months regarding his depression and anxiety.  Last time he had somehow gotten off his scheduled medications.  However for the last 2 months he has been taking them regularly and he is feeling better.  Sometimes he has down days when he thinks about his 1 daughter who will speak to them but he is trying to spend time with his other children and grandchildren on the stairs a month.  He is staying active around the house.  To his credit he has lost about 15 pounds.  He denies significant anxiety and is sleeping well.  He denies thoughts of suicide or self-harm  Visit Diagnosis:    ICD-10-CM   1. Major depressive disorder, recurrent, severe without psychotic features (HCC)  F33.2     2. Anxiety  F41.9       Past Psychiatric History: Last psychiatric hospitalization was in 2012  Past Medical History:  Past Medical History:  Diagnosis Date   Acid reflux    Anxiety    Cervical spinal stenosis    Chronic kidney disease    Stage III   Depression    Depression    GERD (gastroesophageal reflux disease)    Hyperlipidemia    Morbid obesity (HCC)    Sleep apnea    Thyroid disease     Past Surgical History:  Procedure Laterality Date   COLONOSCOPY WITH ESOPHAGOGASTRODUODENOSCOPY (EGD)  2010   Dr. Jena Gauss: ulcerative/erosive reflux esophagitis, noncritical schatki's ring, multiple bulbar erosions with normal sb biopsies, colonic diverticulosis, normal TI   COLONOSCOPY WITH PROPOFOL N/A 10/30/2019   Procedure: COLONOSCOPY WITH PROPOFOL;  Surgeon: Corbin Ade, MD;  [facility];  Service: Endoscopy;  Laterality: N/A;  1:30pm   COLONOSCOPY  WITH PROPOFOL N/A 02/16/2022   Procedure: COLONOSCOPY WITH PROPOFOL;  Surgeon: Corbin Ade, MD;  [facility];  Service: Endoscopy;  Laterality: N/A;  12:00pm   ESOPHAGOGASTRODUODENOSCOPY (EGD) WITH PROPOFOL N/A 02/16/2022   Procedure: ESOPHAGOGASTRODUODENOSCOPY (EGD) WITH PROPOFOL;  Surgeon: Corbin Ade, MD;  [facility];  Service: Endoscopy;  Laterality: N/A;   POLYPECTOMY  10/30/2019   Procedure: POLYPECTOMY;  Surgeon: Corbin Ade, MD;  [facility];  Service: Endoscopy;;   POLYPECTOMY  02/16/2022   Procedure: POLYPECTOMY INTESTINAL;  Surgeon: Corbin Ade, MD;  [facility];  Service: Endoscopy;;   TONSILLECTOMY     Upper and Lower GI      Family Psychiatric History: See below  Family History:  Family History  Problem Relation Age of Onset   Alcohol abuse Father    Colon cancer Father    COPD Father    Depression Daughter        acute   Sexual abuse Daughter    Lung disease Mother    COPD Mother    Thyroid disease Mother    Alcohol abuse Paternal Uncle    Colon cancer Daughter 41   ADD / ADHD Neg Hx    Anxiety disorder Neg Hx    OCD Neg Hx    Drug abuse Neg Hx  Bipolar disorder Neg Hx    Dementia Neg Hx    Paranoid behavior Neg Hx    Schizophrenia Neg Hx    Seizures Neg Hx    Physical abuse Neg Hx     Social History:  Social History   Socioeconomic History   Marital status: Married    Spouse name: Not on file   Number of children: Not on file   Years of education: Not on file   Highest education level: Not on file  Occupational History   Not on file  Tobacco Use   Smoking status: Never   Smokeless tobacco: Never  Substance and Sexual Activity   Alcohol use: Not Currently    Comment: occ; update 10/26 "I don't drink anymore"   Drug use: No   Sexual activity: Yes    Birth control/protection: Surgical  Other Topics Concern   Not on file  Social History Narrative   Lives at home with wife    Caffeine: coffee 1 large cup/day   Social Determinants of Health   Financial Resource Strain: Not on file  Food Insecurity: Not on file  Transportation Needs: Not on file  Physical Activity: Not on file  Stress: Not on file  Social Connections: Not on file    Allergies: No Known Allergies  Metabolic Disorder Labs: No results found for: "HGBA1C", "MPG" No results found for: "PROLACTIN" Lab Results  Component Value Date   CHOL  06/16/2010    166        ATP III CLASSIFICATION:  <200     mg/dL   Desirable  161-096  mg/dL   Borderline High  >=045    mg/dL   High          TRIG 409 (H) 06/16/2010   HDL 29 (L) 06/16/2010   CHOLHDL 5.7 06/16/2010   VLDL 33 06/16/2010   LDLCALC (H) 06/16/2010    104        Total Cholesterol/HDL:CHD Risk Coronary Heart Disease Risk Table                     Men   Women  1/2 Average Risk   3.4   3.3  Average Risk       5.0   4.4  2 X Average Risk   9.6   7.1  3 X Average Risk  23.4   11.0        Use the calculated Patient Ratio above and the CHD Risk Table to determine the patient's CHD Risk.        ATP III CLASSIFICATION (LDL):  <100     mg/dL   Optimal  811-914  mg/dL   Near or Above                    Optimal  130-159  mg/dL   Borderline  782-956  mg/dL   High  >213     mg/dL   Very High   Lab Results  Component Value Date   TSH 11.645 (H) 12/14/2010   TSH 0.264 (L) 06/16/2010    Therapeutic Level Labs: No results found for: "LITHIUM" No results found for: "VALPROATE" No results found for: "CBMZ"  Current Medications: Current Outpatient Medications  Medication Sig Dispense Refill   amLODipine (NORVASC) 5 MG tablet Take 5 mg by mouth at bedtime.     cyclobenzaprine (FLEXERIL) 10 MG tablet Take 10 mg by mouth 2 (two) times daily as needed for muscle spasms.  gabapentin (NEURONTIN) 300 MG capsule Take 300 mg by mouth 3 (three) times daily as needed (pain).     levothyroxine (SYNTHROID) 150 MCG tablet Take 150 mcg by mouth  daily before breakfast.     Multiple Vitamin (MULTIVITAMIN WITH MINERALS) TABS tablet Take 1 tablet by mouth daily.     olmesartan (BENICAR) 40 MG tablet Take 40 mg by mouth daily.     pantoprazole (PROTONIX) 40 MG tablet Take 40 mg by mouth daily.     ALPRAZolam (XANAX) 1 MG tablet TAKE 1 TABLET FOUR TIMES DAILY 120 tablet 2   DULoxetine (CYMBALTA) 60 MG capsule Take 1 capsule (60 mg total) by mouth daily. 90 capsule 1   traZODone (DESYREL) 100 MG tablet Take 2 tablets (200 mg total) by mouth at bedtime. 180 tablet 2   No current facility-administered medications for this visit.     Musculoskeletal: Strength & Muscle Tone: within normal limits Gait & Station: normal Patient leans: N/A  Psychiatric Specialty Exam: Review of Systems  All other systems reviewed and are negative.   Blood pressure 133/72, pulse 65, height 5\' 11"  (1.803 m), weight 295 lb (133.8 kg), SpO2 96 %.Body mass index is 41.14 kg/m.  General Appearance: Casual and Fairly Groomed  Eye Contact:  Good  Speech:  Clear and Coherent  Volume:  Normal  Mood:  Euthymic  Affect:  Congruent  Thought Process:  Goal Directed  Orientation:  Full (Time, Place, and Person)  Thought Content: Rumination   Suicidal Thoughts:  No  Homicidal Thoughts:  No  Memory:  Immediate;   Good Recent;   Good Remote;   Good  Judgement:  Good  Insight:  Good  Psychomotor Activity:  Normal  Concentration:  Concentration: Good and Attention Span: Good  Recall:  Good  Fund of Knowledge: Good  Language: Good  Akathisia:  No  Handed:  Right  AIMS (if indicated): not done  Assets:  Communication Skills Desire for Improvement Physical Health Resilience Social Support Talents/Skills  ADL's:  Intact  Cognition: WNL  Sleep:  Good   Screenings: GAD-7    Flowsheet Row Office Visit from 04/04/2023 in [facility] [place]  Total GAD-7 Score 7      Mini-Mental    Flowsheet Row Office Visit from  12/01/2020 in [facility]  Total Score (max 30 points ) 24      PHQ2-9    Flowsheet Row Office Visit from 04/04/2023 in [facility] [place] Video Visit from 11/06/2022 in [facility] at [place] Video Visit from 09/06/2022 in [facility] at [place] Office Visit from 05/08/2022 in [facility] at [place] Video Visit from 12/19/2021 in [facility] at [facility] Total Score 1 0 2 0 0  PHQ-9 Total Score 8 -- 9 -- --      Flowsheet Row Video Visit from 11/06/2022 in [facility] at [place] Office Visit from 05/08/2022 in [facility] [place] Admission (Discharged) from 02/16/2022 in [facility]  C-SSRS RISK CATEGORY No Risk No Risk No Risk        Assessment and Plan: This patient is a 62 year old male with a history of PTSD and anxiety.  He is doing better now on the his regimen now that he is more med compliant.  He will continue Cymbalta 60 mg daily for depression, Xanax 1  mg 4 times daily as needed for anxiety and trazodone 200 mg at bedtime for sleep.  He will return to see me in 3 months  Collaboration of Care: Collaboration of Care: Primary Care Provider AEB notes to be shared with PCP at patient's request  Patient/Guardian was advised Release of Information must be obtained prior to any record release in order to collaborate their care with an outside provider. Patient/Guardian was advised if they have not already done so to contact the registration department to sign all necessary forms in order for [place] to release information regarding their care.   Consent: Patient/Guardian gives verbal consent for treatment and assignment of benefits for services provided during this visit. Patient/Guardian expressed understanding and agreed to  proceed.    Diannia Ruder, MD 04/04/2023, 11:31 AM

## 2023-05-08 DIAGNOSIS — F319 Bipolar disorder, unspecified: Secondary | ICD-10-CM | POA: Diagnosis not present

## 2023-05-08 DIAGNOSIS — R519 Headache, unspecified: Secondary | ICD-10-CM | POA: Diagnosis not present

## 2023-05-08 DIAGNOSIS — J019 Acute sinusitis, unspecified: Secondary | ICD-10-CM | POA: Diagnosis not present

## 2023-05-16 DIAGNOSIS — I10 Essential (primary) hypertension: Secondary | ICD-10-CM | POA: Diagnosis not present

## 2023-05-16 DIAGNOSIS — J019 Acute sinusitis, unspecified: Secondary | ICD-10-CM | POA: Diagnosis not present

## 2023-05-16 DIAGNOSIS — Z7182 Exercise counseling: Secondary | ICD-10-CM | POA: Diagnosis not present

## 2023-05-16 DIAGNOSIS — R519 Headache, unspecified: Secondary | ICD-10-CM | POA: Diagnosis not present

## 2023-05-16 DIAGNOSIS — J302 Other seasonal allergic rhinitis: Secondary | ICD-10-CM | POA: Diagnosis not present

## 2023-05-16 DIAGNOSIS — Z6841 Body Mass Index (BMI) 40.0 and over, adult: Secondary | ICD-10-CM | POA: Diagnosis not present

## 2023-05-16 DIAGNOSIS — E782 Mixed hyperlipidemia: Secondary | ICD-10-CM | POA: Diagnosis not present

## 2023-05-16 DIAGNOSIS — Z713 Dietary counseling and surveillance: Secondary | ICD-10-CM | POA: Diagnosis not present

## 2023-05-23 ENCOUNTER — Other Ambulatory Visit (HOSPITAL_COMMUNITY): Payer: Self-pay | Admitting: Internal Medicine

## 2023-05-23 DIAGNOSIS — R519 Headache, unspecified: Secondary | ICD-10-CM

## 2023-05-25 ENCOUNTER — Ambulatory Visit (HOSPITAL_COMMUNITY)
Admission: RE | Admit: 2023-05-25 | Discharge: 2023-05-25 | Disposition: A | Discharge status: Home / Self Care | Payer: Medicare HMO | Source: Ambulatory Visit | Attending: Internal Medicine | Admitting: Internal Medicine

## 2023-05-25 DIAGNOSIS — R519 Headache, unspecified: Secondary | ICD-10-CM | POA: Insufficient documentation

## 2023-05-31 ENCOUNTER — Telehealth (HOSPITAL_COMMUNITY): Payer: Self-pay | Admitting: Psychiatry

## 2023-05-31 ENCOUNTER — Other Ambulatory Visit (HOSPITAL_COMMUNITY): Payer: Self-pay | Admitting: Psychiatry

## 2023-05-31 MED ORDER — GABAPENTIN 300 MG PO CAPS: 300.0000 mg | ORAL_CAPSULE | Freq: Three times a day (TID) | ORAL | 2 refills | Status: DC | PRN

## 2023-05-31 NOTE — Telephone Encounter (Signed)
sent 

## 2023-05-31 NOTE — Telephone Encounter (Signed)
Patients wife called in stating that the patient is requesting a refill of his gabapentin.  Pharmacy: [facility] - [place], [place] - [address] (Ph: [phone]

## 2023-06-12 DIAGNOSIS — D509 Iron deficiency anemia, unspecified: Secondary | ICD-10-CM | POA: Diagnosis not present

## 2023-06-12 DIAGNOSIS — Z125 Encounter for screening for malignant neoplasm of prostate: Secondary | ICD-10-CM | POA: Diagnosis not present

## 2023-06-12 DIAGNOSIS — R7301 Impaired fasting glucose: Secondary | ICD-10-CM | POA: Diagnosis not present

## 2023-06-12 DIAGNOSIS — E782 Mixed hyperlipidemia: Secondary | ICD-10-CM | POA: Diagnosis not present

## 2023-06-12 DIAGNOSIS — E039 Hypothyroidism, unspecified: Secondary | ICD-10-CM | POA: Diagnosis not present

## 2023-06-15 DIAGNOSIS — J302 Other seasonal allergic rhinitis: Secondary | ICD-10-CM | POA: Diagnosis not present

## 2023-06-15 DIAGNOSIS — N1831 Chronic kidney disease, stage 3a: Secondary | ICD-10-CM | POA: Diagnosis not present

## 2023-06-15 DIAGNOSIS — I129 Hypertensive chronic kidney disease with stage 1 through stage 4 chronic kidney disease, or unspecified chronic kidney disease: Secondary | ICD-10-CM | POA: Diagnosis not present

## 2023-06-15 DIAGNOSIS — G8929 Other chronic pain: Secondary | ICD-10-CM | POA: Diagnosis not present

## 2023-06-15 DIAGNOSIS — E039 Hypothyroidism, unspecified: Secondary | ICD-10-CM | POA: Diagnosis not present

## 2023-06-15 DIAGNOSIS — E782 Mixed hyperlipidemia: Secondary | ICD-10-CM | POA: Diagnosis not present

## 2023-06-15 DIAGNOSIS — R519 Headache, unspecified: Secondary | ICD-10-CM | POA: Diagnosis not present

## 2023-06-15 DIAGNOSIS — I10 Essential (primary) hypertension: Secondary | ICD-10-CM | POA: Diagnosis not present

## 2023-06-19 DIAGNOSIS — Z23 Encounter for immunization: Secondary | ICD-10-CM | POA: Diagnosis not present

## 2023-07-04 ENCOUNTER — Ambulatory Visit (HOSPITAL_COMMUNITY): Payer: Medicare HMO | Admitting: Psychiatry

## 2023-07-04 ENCOUNTER — Encounter (HOSPITAL_COMMUNITY): Payer: Self-pay | Admitting: Psychiatry

## 2023-07-04 ENCOUNTER — Telehealth (INDEPENDENT_AMBULATORY_CARE_PROVIDER_SITE_OTHER): Payer: Medicare HMO | Admitting: Psychiatry

## 2023-07-04 DIAGNOSIS — F419 Anxiety disorder, unspecified: Secondary | ICD-10-CM | POA: Diagnosis not present

## 2023-07-04 DIAGNOSIS — F5105 Insomnia due to other mental disorder: Secondary | ICD-10-CM | POA: Diagnosis not present

## 2023-07-04 DIAGNOSIS — F332 Major depressive disorder, recurrent severe without psychotic features: Secondary | ICD-10-CM | POA: Diagnosis not present

## 2023-07-04 MED ORDER — ALPRAZOLAM 1 MG PO TABS: ORAL_TABLET | ORAL | 2 refills | Status: DC

## 2023-07-04 MED ORDER — DULOXETINE HCL 60 MG PO CPEP: 60.0000 mg | ORAL_CAPSULE | Freq: Every day | ORAL | 1 refills | Status: DC

## 2023-07-04 MED ORDER — GABAPENTIN 300 MG PO CAPS: 300.0000 mg | ORAL_CAPSULE | Freq: Three times a day (TID) | ORAL | 2 refills | Status: DC | PRN

## 2023-07-04 MED ORDER — TRAZODONE HCL 100 MG PO TABS: 200.0000 mg | ORAL_TABLET | Freq: Every day | ORAL | 2 refills | Status: DC

## 2023-07-04 NOTE — Progress Notes (Signed)
Virtual Visit via Video Note  I connected with Greg Kemp on 07/04/23 at 11:00 AM EDT by a video enabled telemedicine application and verified that I am speaking with the correct person using two identifiers.  Location: Patient: home Provider: office   I discussed the limitations of evaluation and management by telemedicine and the availability of in person appointments. The patient expressed understanding and agreed to proceed.     I discussed the assessment and treatment plan with the patient. The patient was provided an opportunity to ask questions and all were answered. The patient agreed with the plan and demonstrated an understanding of the instructions.   The patient was advised to call back or seek an in-person evaluation if the symptoms worsen or if the condition fails to improve as anticipated.  I provided 15 minutes of non-face-to-face time during this encounter.   Diannia Ruder, MD  [facility] MD/PA/NP OP Progress Note  07/04/2023 11:04 AM Greg Kemp  MRN:  [id number]  Chief Complaint:  Chief Complaint  Patient presents with   Anxiety   Depression   Follow-up   HPI: This patient is a 62 year old white male who lives with his wife in [place].  He is on disability.  The patient and wife return for follow-up after 3 months regarding his depression and anxiety.  He states for the most part he has been doing well in terms of mood.  Unfortunately he has been suffering from severe headaches for most of the summer.  He has gotten antibiotics and antihistamines from his primary doctor.  He had a brain CT which was normal.  He is now scheduled to see an ENT this week.  He states the headache has let up just a little bit over the last few days but often wakes him up from sleep most of it is in the frontal and maxillary sinuses.  The patient denies significant depression thoughts of self-harm.  His anxiety is under good control and his mood appears to be good. Visit  Diagnosis:    ICD-10-CM   1. Major depressive disorder, recurrent, severe without psychotic features (HCC)  F33.2     2. Anxiety  F41.9     3. Insomnia due to mental disorder  F51.05       Past Psychiatric History: Psychiatric hospitalization in 2012  Past Medical History:  Past Medical History:  Diagnosis Date   Acid reflux    Anxiety    Cervical spinal stenosis    Chronic kidney disease    Stage III   Depression    Depression    GERD (gastroesophageal reflux disease)    Hyperlipidemia    Morbid obesity (HCC)    Sleep apnea    Thyroid disease     Past Surgical History:  Procedure Laterality Date   COLONOSCOPY WITH ESOPHAGOGASTRODUODENOSCOPY (EGD)  2010   Dr. Jena Gauss: ulcerative/erosive reflux esophagitis, noncritical schatki's ring, multiple bulbar erosions with normal sb biopsies, colonic diverticulosis, normal TI   COLONOSCOPY WITH PROPOFOL N/A 10/30/2019   Procedure: COLONOSCOPY WITH PROPOFOL;  Surgeon: Corbin Ade, MD;  [facility];  Service: Endoscopy;  Laterality: N/A;  1:30pm   COLONOSCOPY WITH PROPOFOL N/A 02/16/2022   Procedure: COLONOSCOPY WITH PROPOFOL;  Surgeon: Corbin Ade, MD;  [facility];  Service: Endoscopy;  Laterality: N/A;  12:00pm   ESOPHAGOGASTRODUODENOSCOPY (EGD) WITH PROPOFOL N/A 02/16/2022   Procedure: ESOPHAGOGASTRODUODENOSCOPY (EGD) WITH PROPOFOL;  Surgeon: Corbin Ade, MD;  [facility];  Service: Endoscopy;  Laterality: N/A;   POLYPECTOMY  10/30/2019   Procedure: POLYPECTOMY;  Surgeon: Corbin Ade, MD;  [facility];  Service: Endoscopy;;   POLYPECTOMY  02/16/2022   Procedure: POLYPECTOMY INTESTINAL;  Surgeon: Corbin Ade, MD;  [facility];  Service: Endoscopy;;   TONSILLECTOMY     Upper and Lower GI      Family Psychiatric History: see below  Family History:  Family History  Problem Relation Age of Onset   Alcohol abuse Father    Colon cancer Father    COPD Father     Depression Daughter        acute   Sexual abuse Daughter    Lung disease Mother    COPD Mother    Thyroid disease Mother    Alcohol abuse Paternal Uncle    Colon cancer Daughter 61   ADD / ADHD Neg Hx    Anxiety disorder Neg Hx    OCD Neg Hx    Drug abuse Neg Hx    Bipolar disorder Neg Hx    Dementia Neg Hx    Paranoid behavior Neg Hx    Schizophrenia Neg Hx    Seizures Neg Hx    Physical abuse Neg Hx     Social History:  Social History   Socioeconomic History   Marital status: Married    Spouse name: Not on file   Number of children: Not on file   Years of education: Not on file   Highest education level: Not on file  Occupational History   Not on file  Tobacco Use   Smoking status: Never   Smokeless tobacco: Never  Substance and Sexual Activity   Alcohol use: Not Currently    Comment: occ; update 10/26 "I don't drink anymore"   Drug use: No   Sexual activity: Yes    Birth control/protection: Surgical  Other Topics Concern   Not on file  Social History Narrative   Lives at home with wife   Caffeine: coffee 1 large cup/day   Social Determinants of Health   Financial Resource Strain: Not on file  Food Insecurity: Not on file  Transportation Needs: Not on file  Physical Activity: Not on file  Stress: Not on file  Social Connections: Not on file    Allergies: No Known Allergies  Metabolic Disorder Labs: No results found for: "HGBA1C", "MPG" No results found for: "PROLACTIN" Lab Results  Component Value Date   CHOL  06/16/2010    166        ATP III CLASSIFICATION:  <200     mg/dL   Desirable  782-956  mg/dL   Borderline High  >=213    mg/dL   High          TRIG 086 (H) 06/16/2010   HDL 29 (L) 06/16/2010   CHOLHDL 5.7 06/16/2010   VLDL 33 06/16/2010   LDLCALC (H) 06/16/2010    104        Total Cholesterol/HDL:CHD Risk Coronary Heart Disease Risk Table                     Men   Women  1/2 Average Risk   3.4   3.3  Average Risk       5.0    4.4  2 X Average Risk   9.6   7.1  3 X Average Risk  23.4   11.0        Use  the calculated Patient Ratio above and the CHD Risk Table to determine the patient's CHD Risk.        ATP III CLASSIFICATION (LDL):  <100     mg/dL   Optimal  191-478  mg/dL   Near or Above                    Optimal  130-159  mg/dL   Borderline  295-621  mg/dL   High  >308     mg/dL   Very High   Lab Results  Component Value Date   TSH 11.645 (H) 12/14/2010   TSH 0.264 (L) 06/16/2010    Therapeutic Level Labs: No results found for: "LITHIUM" No results found for: "VALPROATE" No results found for: "CBMZ"  Current Medications: Current Outpatient Medications  Medication Sig Dispense Refill   amLODipine (NORVASC) 10 MG tablet Take 10 mg by mouth daily.     levocetirizine (XYZAL) 5 MG tablet Take 5 mg by mouth daily.     levothyroxine (SYNTHROID) 137 MCG tablet Take 137 mcg by mouth daily.     ALPRAZolam (XANAX) 1 MG tablet TAKE 1 TABLET FOUR TIMES DAILY 120 tablet 2   cyclobenzaprine (FLEXERIL) 10 MG tablet Take 10 mg by mouth 2 (two) times daily as needed for muscle spasms.     DULoxetine (CYMBALTA) 60 MG capsule Take 1 capsule (60 mg total) by mouth daily. 90 capsule 1   gabapentin (NEURONTIN) 300 MG capsule Take 1 capsule (300 mg total) by mouth 3 (three) times daily as needed (pain). 90 capsule 2   Multiple Vitamin (MULTIVITAMIN WITH MINERALS) TABS tablet Take 1 tablet by mouth daily.     olmesartan (BENICAR) 40 MG tablet Take 40 mg by mouth daily.     pantoprazole (PROTONIX) 40 MG tablet Take 40 mg by mouth daily.     traZODone (DESYREL) 100 MG tablet Take 2 tablets (200 mg total) by mouth at bedtime. 180 tablet 2   No current facility-administered medications for this visit.     Musculoskeletal: Strength & Muscle Tone: within normal limits Gait & Station: normal Patient leans: N/A  Psychiatric Specialty Exam: Review of Systems  Neurological:  Positive for headaches.   Psychiatric/Behavioral:  Positive for sleep disturbance.   All other systems reviewed and are negative.   There were no vitals taken for this visit.There is no height or weight on file to calculate BMI.  General Appearance: Casual and Fairly Groomed  Eye Contact:  Good  Speech:  Clear and Coherent  Volume:  Normal  Mood:  Euthymic  Affect:  Congruent  Thought Process:  Goal Directed  Orientation:  Full (Time, Place, and Person)  Thought Content: WDL   Suicidal Thoughts:  No  Homicidal Thoughts:  No  Memory:  Immediate;   Good Recent;   Good Remote;   Fair  Judgement:  Good  Insight:  Fair  Psychomotor Activity:  Decreased  Concentration:  Concentration: Good and Attention Span: Good  Recall:  Good  Fund of Knowledge: Good  Language: Good  Akathisia:  No  Handed:  Right  AIMS (if indicated): not done  Assets:  Communication Skills Desire for Improvement Resilience Social Support Talents/Skills  ADL's:  Intact  Cognition: WNL  Sleep:  Good   Screenings: GAD-7    Flowsheet Row Office Visit from 04/04/2023 in [facility] [place]  Total GAD-7 Score 7      Mini-Mental    Flowsheet Row Office Visit  from 12/01/2020 in [facility]  Total Score (max 30 points ) 24      PHQ2-9    Flowsheet Row Office Visit from 04/04/2023 in [facility] [place] Video Visit from 11/06/2022 in [facility] [place] Video Visit from 09/06/2022 in [facility] [place] Office Visit from 05/08/2022 in [facility] [place] Video Visit from 12/19/2021 in [facility] Total Score 1 0 2 0 0  PHQ-9 Total Score 8 -- 9 -- --      Flowsheet Row Video Visit from 11/06/2022 in [facility] [place] Office Visit from 05/08/2022  in [facility] [place] Admission (Discharged) from 02/16/2022 in [facility]  C-SSRS RISK CATEGORY No Risk No Risk No Risk        Assessment and Plan: This patient is a 72-year-old male with a history of PTSD and anxiety.  He continues to do well on his current regimen.  He will continue Cymbalta 60 mg daily for depression, Xanax 1 mg 4 times daily as needed for anxiety and trazodone 200 mg at bedtime for sleep.  He will return to see me in 3 months  Collaboration of Care: Collaboration of Care: Primary Care Provider AEB notes will be shared at PCP at patient's request  Patient/Guardian was advised Release of Information must be obtained prior to any record release in order to collaborate their care with an outside provider. Patient/Guardian was advised if they have not already done so to contact the registration department to sign all necessary forms in order for [place] to release information regarding their care.   Consent: Patient/Guardian gives verbal consent for treatment and assignment of benefits for services provided during this visit. Patient/Guardian expressed understanding and agreed to proceed.    Diannia Ruder, MD 07/04/2023, 11:04 AM

## 2023-07-06 DIAGNOSIS — J341 Cyst and mucocele of nose and nasal sinus: Secondary | ICD-10-CM | POA: Diagnosis not present

## 2023-07-06 DIAGNOSIS — R519 Headache, unspecified: Secondary | ICD-10-CM | POA: Diagnosis not present

## 2023-07-06 DIAGNOSIS — G8929 Other chronic pain: Secondary | ICD-10-CM | POA: Diagnosis not present

## 2023-07-11 DIAGNOSIS — I10 Essential (primary) hypertension: Secondary | ICD-10-CM | POA: Diagnosis not present

## 2023-07-11 DIAGNOSIS — I129 Hypertensive chronic kidney disease with stage 1 through stage 4 chronic kidney disease, or unspecified chronic kidney disease: Secondary | ICD-10-CM | POA: Diagnosis not present

## 2023-07-11 DIAGNOSIS — N1831 Chronic kidney disease, stage 3a: Secondary | ICD-10-CM | POA: Diagnosis not present

## 2023-07-11 DIAGNOSIS — Z6841 Body Mass Index (BMI) 40.0 and over, adult: Secondary | ICD-10-CM | POA: Diagnosis not present

## 2023-07-11 DIAGNOSIS — E039 Hypothyroidism, unspecified: Secondary | ICD-10-CM | POA: Diagnosis not present

## 2023-07-11 DIAGNOSIS — R600 Localized edema: Secondary | ICD-10-CM | POA: Diagnosis not present

## 2023-07-25 DIAGNOSIS — M47812 Spondylosis without myelopathy or radiculopathy, cervical region: Secondary | ICD-10-CM | POA: Diagnosis not present

## 2023-07-27 DIAGNOSIS — I10 Essential (primary) hypertension: Secondary | ICD-10-CM | POA: Diagnosis not present

## 2023-07-27 DIAGNOSIS — Z713 Dietary counseling and surveillance: Secondary | ICD-10-CM | POA: Diagnosis not present

## 2023-07-27 DIAGNOSIS — R600 Localized edema: Secondary | ICD-10-CM | POA: Diagnosis not present

## 2023-07-27 DIAGNOSIS — E039 Hypothyroidism, unspecified: Secondary | ICD-10-CM | POA: Diagnosis not present

## 2023-07-27 DIAGNOSIS — Z7182 Exercise counseling: Secondary | ICD-10-CM | POA: Diagnosis not present

## 2023-07-27 DIAGNOSIS — N1831 Chronic kidney disease, stage 3a: Secondary | ICD-10-CM | POA: Diagnosis not present

## 2023-07-27 DIAGNOSIS — E876 Hypokalemia: Secondary | ICD-10-CM | POA: Diagnosis not present

## 2023-07-27 DIAGNOSIS — I129 Hypertensive chronic kidney disease with stage 1 through stage 4 chronic kidney disease, or unspecified chronic kidney disease: Secondary | ICD-10-CM | POA: Diagnosis not present

## 2023-09-11 DIAGNOSIS — G8929 Other chronic pain: Secondary | ICD-10-CM | POA: Diagnosis not present

## 2023-09-11 DIAGNOSIS — M2520 Flail joint, unspecified joint: Secondary | ICD-10-CM | POA: Diagnosis not present

## 2023-09-11 DIAGNOSIS — M2550 Pain in unspecified joint: Secondary | ICD-10-CM | POA: Diagnosis not present

## 2023-09-11 DIAGNOSIS — I10 Essential (primary) hypertension: Secondary | ICD-10-CM | POA: Diagnosis not present

## 2023-09-11 DIAGNOSIS — M545 Low back pain, unspecified: Secondary | ICD-10-CM | POA: Diagnosis not present

## 2023-09-11 DIAGNOSIS — R600 Localized edema: Secondary | ICD-10-CM | POA: Diagnosis not present

## 2023-09-11 DIAGNOSIS — M791 Myalgia, unspecified site: Secondary | ICD-10-CM | POA: Diagnosis not present

## 2023-09-11 DIAGNOSIS — N1831 Chronic kidney disease, stage 3a: Secondary | ICD-10-CM | POA: Diagnosis not present

## 2023-09-24 DIAGNOSIS — M47812 Spondylosis without myelopathy or radiculopathy, cervical region: Secondary | ICD-10-CM | POA: Diagnosis not present

## 2023-09-26 ENCOUNTER — Institutional Professional Consult (permissible substitution): Payer: Medicare HMO | Admitting: Neurology

## 2023-11-07 DIAGNOSIS — Z713 Dietary counseling and surveillance: Secondary | ICD-10-CM | POA: Diagnosis not present

## 2023-11-07 DIAGNOSIS — Z7182 Exercise counseling: Secondary | ICD-10-CM | POA: Diagnosis not present

## 2023-11-07 DIAGNOSIS — R600 Localized edema: Secondary | ICD-10-CM | POA: Diagnosis not present

## 2023-11-07 DIAGNOSIS — Z6841 Body Mass Index (BMI) 40.0 and over, adult: Secondary | ICD-10-CM | POA: Diagnosis not present

## 2023-11-07 DIAGNOSIS — Z79899 Other long term (current) drug therapy: Secondary | ICD-10-CM | POA: Diagnosis not present

## 2023-11-07 DIAGNOSIS — I10 Essential (primary) hypertension: Secondary | ICD-10-CM | POA: Diagnosis not present

## 2024-01-09 DIAGNOSIS — E039 Hypothyroidism, unspecified: Secondary | ICD-10-CM | POA: Diagnosis not present

## 2024-01-09 DIAGNOSIS — R7301 Impaired fasting glucose: Secondary | ICD-10-CM | POA: Diagnosis not present

## 2024-01-09 DIAGNOSIS — D509 Iron deficiency anemia, unspecified: Secondary | ICD-10-CM | POA: Diagnosis not present

## 2024-01-09 DIAGNOSIS — E782 Mixed hyperlipidemia: Secondary | ICD-10-CM | POA: Diagnosis not present

## 2024-01-09 NOTE — Progress Notes (Signed)
 Updated medications and medical history

## 2024-01-14 ENCOUNTER — Encounter: Payer: Self-pay | Admitting: Internal Medicine

## 2024-01-14 ENCOUNTER — Ambulatory Visit: Payer: Medicare HMO | Attending: Internal Medicine | Admitting: Internal Medicine

## 2024-01-14 VITALS — BP 126/70 | HR 77 | Ht 72.0 in | Wt 308.2 lb

## 2024-01-14 DIAGNOSIS — M7989 Other specified soft tissue disorders: Secondary | ICD-10-CM | POA: Diagnosis not present

## 2024-01-14 DIAGNOSIS — Z136 Encounter for screening for cardiovascular disorders: Secondary | ICD-10-CM | POA: Diagnosis not present

## 2024-01-14 DIAGNOSIS — I10 Essential (primary) hypertension: Secondary | ICD-10-CM | POA: Diagnosis not present

## 2024-01-14 DIAGNOSIS — I509 Heart failure, unspecified: Secondary | ICD-10-CM

## 2024-01-14 DIAGNOSIS — R0609 Other forms of dyspnea: Secondary | ICD-10-CM

## 2024-01-14 NOTE — Progress Notes (Signed)
 Cardiology Office Note  Date: 01/14/2024   ID: Rashard, Ryle May 26, 1961, MRN [id number]  PCP:  Benita Stabile, MD  Cardiologist:  None Electrophysiologist:  None   History of Present Illness: Greg Kemp is a 63 y.o. male known to have HTN was referred to cardiology clinic for evaluation of leg swelling.  Amlodipine dose was decreased from 10 mg to 5 mg and in addition, Lasix 40 mg was also administered for leg swelling after which his leg swelling complete resolved.  He also had symptoms of DOE and chest pains at that time.  He took p.o. Lasix 40 mg once daily for couple of months, he lost around 30 pounds of weight.  Currently, he does not have any leg swelling.  He does have some shortness of breath with exertion but not bothersome.  Not like before.  No angina.  No dizziness, leg swelling, palpitations or syncope.  He underwent CT calcium scoring test done that showed coronary calcium score of 0.  Past Medical History:  Diagnosis Date   Acid reflux    Anxiety    Cervical spinal stenosis    Chronic kidney disease    Stage III   Depression    Depression    GERD (gastroesophageal reflux disease)    Hyperlipidemia    Hypertension    Morbid obesity (HCC)    Sleep apnea    Thyroid disease     Past Surgical History:  Procedure Laterality Date   COLONOSCOPY WITH ESOPHAGOGASTRODUODENOSCOPY (EGD)  2010   Dr. Jena Gauss: ulcerative/erosive reflux esophagitis, noncritical schatki's ring, multiple bulbar erosions with normal sb biopsies, colonic diverticulosis, normal TI   COLONOSCOPY WITH PROPOFOL N/A 10/30/2019   Procedure: COLONOSCOPY WITH PROPOFOL;  Surgeon: Corbin Ade, MD;  [facility];  Service: Endoscopy;  Laterality: N/A;  1:30pm   COLONOSCOPY WITH PROPOFOL N/A 02/16/2022   Procedure: COLONOSCOPY WITH PROPOFOL;  Surgeon: Corbin Ade, MD;  [facility];  Service: Endoscopy;  Laterality: N/A;  12:00pm   ESOPHAGOGASTRODUODENOSCOPY (EGD) WITH  PROPOFOL N/A 02/16/2022   Procedure: ESOPHAGOGASTRODUODENOSCOPY (EGD) WITH PROPOFOL;  Surgeon: Corbin Ade, MD;  [facility];  Service: Endoscopy;  Laterality: N/A;   POLYPECTOMY  10/30/2019   Procedure: POLYPECTOMY;  Surgeon: Corbin Ade, MD;  [facility];  Service: Endoscopy;;   POLYPECTOMY  02/16/2022   Procedure: POLYPECTOMY INTESTINAL;  Surgeon: Corbin Ade, MD;  [facility];  Service: Endoscopy;;   TONSILLECTOMY     Upper and Lower GI      Current Outpatient Medications  Medication Sig Dispense Refill   ALPRAZolam (XANAX) 1 MG tablet Take 1 mg by mouth as needed for anxiety.     amLODipine (NORVASC) 10 MG tablet Take 5 mg by mouth daily.     cyclobenzaprine (FLEXERIL) 10 MG tablet Take 10 mg by mouth 2 (two) times daily as needed for muscle spasms.     DULoxetine (CYMBALTA) 60 MG capsule Take 1 capsule (60 mg total) by mouth daily. 90 capsule 1   furosemide (LASIX) 40 MG tablet Take 40 mg by mouth as needed.     gabapentin (NEURONTIN) 300 MG capsule Take 1 capsule (300 mg total) by mouth 3 (three) times daily as needed (pain). 90 capsule 2   levocetirizine (XYZAL) 5 MG tablet Take 5 mg by mouth daily.     levothyroxine (SYNTHROID) 137 MCG tablet Take 137 mcg by mouth daily.  Multiple Vitamin (MULTIVITAMIN WITH MINERALS) TABS tablet Take 1 tablet by mouth daily.     olmesartan (BENICAR) 40 MG tablet Take 40 mg by mouth daily.     pantoprazole (PROTONIX) 40 MG tablet Take 40 mg by mouth daily.     potassium chloride SA (KLOR-CON M) 20 MEQ tablet Take 20 mEq by mouth as needed.     traZODone (DESYREL) 100 MG tablet Take 2 tablets (200 mg total) by mouth at bedtime. 180 tablet 2   No current facility-administered medications for this visit.   Allergies:  Patient has no known allergies.   Social History: The patient  reports that he has never smoked. He has never used smokeless tobacco. He reports that he does not currently use alcohol.  He reports that he does not use drugs.   Family History: The patient's family history includes Alcohol abuse in his father and paternal uncle; COPD in his father and mother; Colon cancer in his father; Colon cancer (age of onset: 32) in his daughter; Depression in his daughter; Lung disease in his mother; Sexual abuse in his daughter; Thyroid disease in his mother.   ROS:  Please see the history of present illness. Otherwise, complete review of systems is positive for none  All other systems are reviewed and negative.   Physical Exam: VS:  BP 126/70   Pulse 77   Ht 6' (1.829 m)   Wt (!) 308 lb 3.2 oz (139.8 kg)   SpO2 96%   BMI 41.80 kg/m , BMI Body mass index is 41.8 kg/m.  Wt Readings from Last 3 Encounters:  01/14/24 (!) 308 lb 3.2 oz (139.8 kg)  02/14/22 285 lb (129.3 kg)  01/03/22 283 lb 12.8 oz (128.7 kg)    General: Patient appears comfortable at rest. HEENT: Conjunctiva and lids normal, oropharynx clear with moist mucosa. Neck: Supple, no elevated JVP or carotid bruits, no thyromegaly. Lungs: Clear to auscultation, nonlabored breathing at rest. Cardiac: Regular rate and rhythm, no S3 or significant systolic murmur, no pericardial rub. Abdomen: Soft, nontender, no hepatomegaly, bowel sounds present, no guarding or rebound. Extremities: No pitting edema, distal pulses 2+. Skin: Warm and dry. Musculoskeletal: No kyphosis. Neuropsychiatric: Alert and oriented x3, affect grossly appropriate.  Recent Labwork: No results found for requested labs within last 365 days.     Component Value Date/Time   CHOL  06/16/2010 0510    166        ATP III CLASSIFICATION:  <200     mg/dL   Desirable  161-096  mg/dL   Borderline High  >=045    mg/dL   High          TRIG 409 (H) 06/16/2010 0510   HDL 29 (L) 06/16/2010 0510   CHOLHDL 5.7 06/16/2010 0510   VLDL 33 06/16/2010 0510   LDLCALC (H) 06/16/2010 0510    104        Total Cholesterol/HDL:CHD Risk Coronary Heart Disease Risk  Table                     Men   Women  1/2 Average Risk   3.4   3.3  Average Risk       5.0   4.4  2 X Average Risk   9.6   7.1  3 X Average Risk  23.4   11.0        Use the calculated Patient Ratio above and the CHD Risk Table to determine the patient's CHD  Risk.        ATP III CLASSIFICATION (LDL):  <100     mg/dL   Optimal  981-191  mg/dL   Near or Above                    Optimal  130-159  mg/dL   Borderline  478-295  mg/dL   High  >621     mg/dL   Very High     Assessment and Plan:   Congestive heart failure: Patient had symptoms of DOE, bilateral leg swelling and weight gain that resolved after starting p.o. Lasix 40 mg once daily for couple of months. Currently on p.o. Lasix 40 mg as needed for leg swelling. He still continues to have DOE but not as bad as before. Obtain 2D echocardiogram. CT calcium scoring test was done which showed coronary calcium score of 0.  HTN, controlled: Continue current antihypertensives, amlodipine 5 mg once daily, olmesartan 40 mg once daily.  Follow-up with PCP.      Medication Adjustments/Labs and Tests Ordered: Current medicines are reviewed at length with the patient today.  Concerns regarding medicines are outlined above.    Disposition:  Follow up  6 months  Signed Alliyah Roesler Verne Spurr, MD, 01/14/2024 3:13 PM    [facility] [address] [place], [place], [place] [address]

## 2024-01-14 NOTE — Patient Instructions (Signed)

## 2024-01-16 ENCOUNTER — Other Ambulatory Visit (HOSPITAL_COMMUNITY): Payer: Self-pay | Admitting: Psychiatry

## 2024-01-16 ENCOUNTER — Encounter: Payer: Self-pay | Admitting: Internal Medicine

## 2024-01-16 DIAGNOSIS — I10 Essential (primary) hypertension: Secondary | ICD-10-CM | POA: Diagnosis not present

## 2024-01-16 DIAGNOSIS — M545 Low back pain, unspecified: Secondary | ICD-10-CM | POA: Diagnosis not present

## 2024-01-16 DIAGNOSIS — I130 Hypertensive heart and chronic kidney disease with heart failure and stage 1 through stage 4 chronic kidney disease, or unspecified chronic kidney disease: Secondary | ICD-10-CM | POA: Diagnosis not present

## 2024-01-16 DIAGNOSIS — M2550 Pain in unspecified joint: Secondary | ICD-10-CM | POA: Diagnosis not present

## 2024-01-16 DIAGNOSIS — R252 Cramp and spasm: Secondary | ICD-10-CM | POA: Diagnosis not present

## 2024-01-16 DIAGNOSIS — E782 Mixed hyperlipidemia: Secondary | ICD-10-CM | POA: Diagnosis not present

## 2024-01-16 DIAGNOSIS — I509 Heart failure, unspecified: Secondary | ICD-10-CM | POA: Diagnosis not present

## 2024-01-16 DIAGNOSIS — R600 Localized edema: Secondary | ICD-10-CM | POA: Diagnosis not present

## 2024-01-28 ENCOUNTER — Ambulatory Visit: Attending: Internal Medicine

## 2024-01-28 DIAGNOSIS — R0609 Other forms of dyspnea: Secondary | ICD-10-CM

## 2024-01-28 LAB — ECHOCARDIOGRAM COMPLETE
AR max vel: 3.77 cm2
AV Area VTI: 3.28 cm2
AV Area mean vel: 3.57 cm2
AV Mean grad: 3 mmHg
AV Peak grad: 4.4 mmHg
Ao pk vel: 1.05 m/s
Area-P 1/2: 3.77 cm2
Calc EF: 79.3 %
MV VTI: 3.28 cm2
S' Lateral: 3 cm
Single Plane A2C EF: 71.4 %
Single Plane A4C EF: 83 %

## 2024-01-28 MED ORDER — PERFLUTREN LIPID MICROSPHERE
1.0000 mL | INTRAVENOUS | Status: AC | PRN
  Administered 2024-01-28: 10 mL via INTRAVENOUS

## 2024-02-13 DIAGNOSIS — M545 Low back pain, unspecified: Secondary | ICD-10-CM | POA: Diagnosis not present

## 2024-02-13 DIAGNOSIS — M2550 Pain in unspecified joint: Secondary | ICD-10-CM | POA: Diagnosis not present

## 2024-02-13 DIAGNOSIS — R600 Localized edema: Secondary | ICD-10-CM | POA: Diagnosis not present

## 2024-02-13 DIAGNOSIS — E782 Mixed hyperlipidemia: Secondary | ICD-10-CM | POA: Diagnosis not present

## 2024-02-13 DIAGNOSIS — I130 Hypertensive heart and chronic kidney disease with heart failure and stage 1 through stage 4 chronic kidney disease, or unspecified chronic kidney disease: Secondary | ICD-10-CM | POA: Diagnosis not present

## 2024-02-13 DIAGNOSIS — N1831 Chronic kidney disease, stage 3a: Secondary | ICD-10-CM | POA: Diagnosis not present

## 2024-02-13 DIAGNOSIS — I10 Essential (primary) hypertension: Secondary | ICD-10-CM | POA: Diagnosis not present

## 2024-02-13 DIAGNOSIS — I509 Heart failure, unspecified: Secondary | ICD-10-CM | POA: Diagnosis not present

## 2024-02-13 DIAGNOSIS — E039 Hypothyroidism, unspecified: Secondary | ICD-10-CM | POA: Diagnosis not present

## 2024-02-13 DIAGNOSIS — R972 Elevated prostate specific antigen [PSA]: Secondary | ICD-10-CM | POA: Diagnosis not present

## 2024-02-15 ENCOUNTER — Telehealth: Payer: Self-pay

## 2024-02-15 NOTE — Telephone Encounter (Signed)
 The patient has been notified of the result and verbalized understanding.  All questions (if any) were answered. Greg Kemp, [place] 02/15/2024 2:32 PM

## 2024-02-15 NOTE — Telephone Encounter (Signed)
-----   Message from Vishnu P Mallipeddi sent at 02/14/2024 12:30 PM EDT ----- Normal LVEF, G1 DD with normal LVEDP, normal RV function, no valvular heart disease, aortic root 39 mm.  Overall, normal echocardiogram.

## 2024-02-28 ENCOUNTER — Encounter (HOSPITAL_COMMUNITY): Payer: Self-pay | Admitting: Psychiatry

## 2024-02-28 ENCOUNTER — Ambulatory Visit (INDEPENDENT_AMBULATORY_CARE_PROVIDER_SITE_OTHER): Admitting: Psychiatry

## 2024-02-28 ENCOUNTER — Ambulatory Visit: Payer: Self-pay | Admitting: Internal Medicine

## 2024-02-28 VITALS — BP 121/73 | HR 79 | Ht 72.0 in | Wt 289.8 lb

## 2024-02-28 DIAGNOSIS — F332 Major depressive disorder, recurrent severe without psychotic features: Secondary | ICD-10-CM

## 2024-02-28 DIAGNOSIS — F419 Anxiety disorder, unspecified: Secondary | ICD-10-CM

## 2024-02-28 MED ORDER — TRAZODONE HCL 100 MG PO TABS: 200.0000 mg | ORAL_TABLET | Freq: Every day | ORAL | 3 refills | Status: DC

## 2024-02-28 MED ORDER — DULOXETINE HCL 60 MG PO CPEP: 60.0000 mg | ORAL_CAPSULE | Freq: Every day | ORAL | 1 refills | Status: DC

## 2024-02-28 MED ORDER — GABAPENTIN 300 MG PO CAPS: 300.0000 mg | ORAL_CAPSULE | Freq: Three times a day (TID) | ORAL | 3 refills | Status: DC | PRN

## 2024-02-28 MED ORDER — ALPRAZOLAM 1 MG PO TABS: 1.0000 mg | ORAL_TABLET | Freq: Every day | ORAL | 3 refills | Status: DC | PRN

## 2024-02-28 NOTE — Progress Notes (Signed)
 [facility] MD/PA/NP OP Progress Note  02/28/2024 1:29 PM Greg Kemp  MRN:  [id number]  Chief Complaint:  Chief Complaint  Patient presents with   Anxiety   Depression   Follow-up   HPI: This patient is a 62 year old white male lives with his wife in [place].  He is on disability.  The patient returns on his own after about 6 months regarding his depression and anxiety.  For the most part he has been doing well in terms of mood.  He still has struggles with his oldest daughter who has been estranged from the family but he does appreciate care he gets from his other kids and grandkids.  He has been worried about an elevated PSA and is slated to see a urologist next month and he is trying not to worry about it until he gets more information.  Generally he has been sleeping fairly well.  He denies significant depression anxiety or thoughts of self-harm Visit Diagnosis:    ICD-10-CM   1. Major depressive disorder, recurrent, severe without psychotic features (HCC)  F33.2     2. Anxiety  F41.9       Past Psychiatric History: Psychiatric hospitalization in 2012  Past Medical History:  Past Medical History:  Diagnosis Date   Acid reflux    Anxiety    Cervical spinal stenosis    Chronic kidney disease    Stage III   Depression    Depression    GERD (gastroesophageal reflux disease)    Hyperlipidemia    Hypertension    Morbid obesity (HCC)    Sleep apnea    Thyroid  disease     Past Surgical History:  Procedure Laterality Date   COLONOSCOPY WITH ESOPHAGOGASTRODUODENOSCOPY (EGD)  2010   Dr. Riley Cheadle: ulcerative/erosive reflux esophagitis, noncritical schatki's ring, multiple bulbar erosions with normal sb biopsies, colonic diverticulosis, normal TI   COLONOSCOPY WITH PROPOFOL  N/A 10/30/2019   Procedure: COLONOSCOPY WITH PROPOFOL ;  Surgeon: Suzette Espy, MD;  [facility];  Service: Endoscopy;  Laterality: N/A;  1:30pm   COLONOSCOPY WITH PROPOFOL  N/A 02/16/2022    Procedure: COLONOSCOPY WITH PROPOFOL ;  Surgeon: Suzette Espy, MD;  [facility];  Service: Endoscopy;  Laterality: N/A;  12:00pm   ESOPHAGOGASTRODUODENOSCOPY (EGD) WITH PROPOFOL  N/A 02/16/2022   Procedure: ESOPHAGOGASTRODUODENOSCOPY (EGD) WITH PROPOFOL ;  Surgeon: Suzette Espy, MD;  [facility];  Service: Endoscopy;  Laterality: N/A;   POLYPECTOMY  10/30/2019   Procedure: POLYPECTOMY;  Surgeon: Suzette Espy, MD;  [facility];  Service: Endoscopy;;   POLYPECTOMY  02/16/2022   Procedure: POLYPECTOMY INTESTINAL;  Surgeon: Suzette Espy, MD;  [facility];  Service: Endoscopy;;   TONSILLECTOMY     Upper and Lower GI      Family Psychiatric History: See below  Family History:  Family History  Problem Relation Age of Onset   Alcohol abuse Father    Colon cancer Father    COPD Father    Depression Daughter        acute   Sexual abuse Daughter    Lung disease Mother    COPD Mother    Thyroid  disease Mother    Alcohol abuse Paternal Uncle    Colon cancer Daughter 21   ADD / ADHD Neg Hx    Anxiety disorder Neg Hx    OCD Neg Hx    Drug abuse Neg Hx    Bipolar disorder Neg Hx  Dementia Neg Hx    Paranoid behavior Neg Hx    Schizophrenia Neg Hx    Seizures Neg Hx    Physical abuse Neg Hx     Social History:  Social History   Socioeconomic History   Marital status: Married    Spouse name: Not on file   Number of children: Not on file   Years of education: Not on file   Highest education level: Not on file  Occupational History   Not on file  Tobacco Use   Smoking status: Never   Smokeless tobacco: Never  Substance and Sexual Activity   Alcohol use: Not Currently    Comment: occ; update 10/26 "I don't drink anymore"   Drug use: No   Sexual activity: Yes    Birth control/protection: Surgical  Other Topics Concern   Not on file  Social History Narrative   Lives at home with wife   Caffeine: coffee 1 large cup/day    Social Drivers of Corporate investment banker Strain: Not on file  Food Insecurity: Low Risk  (07/06/2023)   Received from [facility]   Hunger Vital Sign    Worried About Running Out of Food in the Last Year: Never true    Ran Out of Food in the Last Year: Never true  Transportation Needs: No Transportation Needs (07/06/2023)   Received from [facility]    In the past 12 months, has lack of reliable transportation kept you from medical appointments, meetings, work or from getting things needed for daily living? : No  Physical Activity: Not on file  Stress: Not on file  Social Connections: Not on file    Allergies: No Known Allergies  Metabolic Disorder Labs: No results found for: "HGBA1C", "MPG" No results found for: "PROLACTIN" Lab Results  Component Value Date   CHOL  06/16/2010    166        ATP III CLASSIFICATION:  <200     mg/dL   Desirable  161-096  mg/dL   Borderline High  >=045    mg/dL   High          TRIG 409 (H) 06/16/2010   HDL 29 (L) 06/16/2010   CHOLHDL 5.7 06/16/2010   VLDL 33 06/16/2010   LDLCALC (H) 06/16/2010    104        Total Cholesterol/HDL:CHD Risk Coronary Heart Disease Risk Table                     Men   Women  1/2 Average Risk   3.4   3.3  Average Risk       5.0   4.4  2 X Average Risk   9.6   7.1  3 X Average Risk  23.4   11.0        Use the calculated Patient Ratio above and the CHD Risk Table to determine the patient's CHD Risk.        ATP III CLASSIFICATION (LDL):  <100     mg/dL   Optimal  811-914  mg/dL   Near or Above                    Optimal  130-159  mg/dL   Borderline  782-956  mg/dL   High  >213     mg/dL   Very High   Lab Results  Component Value Date   TSH 11.645 (H) 12/14/2010   TSH  0.264 (L) 06/16/2010    Therapeutic Level Labs: No results found for: "LITHIUM" No results found for: "VALPROATE" No results found for: "CBMZ"  Current Medications: Current Outpatient Medications   Medication Sig Dispense Refill   amLODipine (NORVASC) 10 MG tablet Take 5 mg by mouth daily.     cyclobenzaprine (FLEXERIL) 10 MG tablet Take 10 mg by mouth 2 (two) times daily as needed for muscle spasms.     furosemide (LASIX) 40 MG tablet Take 40 mg by mouth as needed.     levocetirizine (XYZAL) 5 MG tablet Take 5 mg by mouth daily.     levothyroxine (SYNTHROID) 137 MCG tablet Take 137 mcg by mouth daily.     Multiple Vitamin (MULTIVITAMIN WITH MINERALS) TABS tablet Take 1 tablet by mouth daily.     olmesartan (BENICAR) 40 MG tablet Take 40 mg by mouth daily.     pantoprazole (PROTONIX) 40 MG tablet Take 40 mg by mouth daily.     potassium chloride SA (KLOR-CON M) 20 MEQ tablet Take 20 mEq by mouth as needed.     ALPRAZolam  (XANAX ) 1 MG tablet Take 1 tablet (1 mg total) by mouth daily as needed for anxiety. 30 tablet 3   DULoxetine  (CYMBALTA ) 60 MG capsule Take 1 capsule (60 mg total) by mouth daily. 90 capsule 1   gabapentin  (NEURONTIN ) 300 MG capsule Take 1 capsule (300 mg total) by mouth 3 (three) times daily as needed (pain). 90 capsule 3   traZODone  (DESYREL ) 100 MG tablet Take 2 tablets (200 mg total) by mouth at bedtime. 180 tablet 3   No current facility-administered medications for this visit.     Musculoskeletal: Strength & Muscle Tone: within normal limits Gait & Station: normal Patient leans: N/A  Psychiatric Specialty Exam: Review of Systems  Musculoskeletal:  Positive for neck pain.  All other systems reviewed and are negative.   Blood pressure 121/73, pulse 79, height 6' (1.829 m), weight 289 lb 12.8 oz (131.5 kg), SpO2 97%.Body mass index is 39.3 kg/m.  General Appearance: Casual and Fairly Groomed  Eye Contact:  Good  Speech:  Clear and Coherent  Volume:  Normal  Mood:  Euthymic  Affect:  Congruent  Thought Process:  Goal Directed  Orientation:  Full (Time, Place, and Person)  Thought Content: WDL   Suicidal Thoughts:  No  Homicidal Thoughts:  No   Memory:  Immediate;   Good Recent;   Good Remote;   Fair  Judgement:  Good  Insight:  Good  Psychomotor Activity:  Normal  Concentration:  Concentration: Good and Attention Span: Good  Recall:  Good  Fund of Knowledge: Good  Language: Good  Akathisia:  No  Handed:  Right  AIMS (if indicated): not done  Assets:  Communication Skills Desire for Improvement Resilience Social Support Talents/Skills  ADL's:  Intact  Cognition: WNL  Sleep:  Good   Screenings: GAD-7    Flowsheet Row Office Visit from 02/28/2024 in [facility] [place] Office Visit from 04/04/2023 in [facility] [place]  Total GAD-7 Score 0 7      Mini-Mental    Flowsheet Row Office Visit from 12/01/2020 in [facility]  Total Score (max 30 points ) 24      PHQ2-9    Flowsheet Row Office Visit from 02/28/2024 in [facility] [place] Office Visit from 04/04/2023 in [facility] [place] Video Visit from  11/06/2022 in [facility] [place] Video Visit from 09/06/2022 in [facility] Visit from 05/08/2022 in [facility] Total Score 0 1 0 2 0  PHQ-9 Total Score 2 8 -- 9 --      Flowsheet Row Video Visit from 11/06/2022 in [facility] [place] Office Visit from 05/08/2022 in [facility] [place] Admission (Discharged) from 02/16/2022 in [place] [facility]  C-SSRS RISK CATEGORY No Risk No Risk No Risk        Assessment and Plan: This patient is a 63 year old male with a history of PTSD and anxiety.  He continues to do well on his current regimen.  He will continue Cymbalta  60 mg daily for depression and Xanax  1 mg 4 times daily as needed for anxiety trazodone   200 mg at bedtime for sleep and gabapentin  300 mg 3 times daily as needed for anxiety or nerve pain.  He will return to see me in 4 months  Collaboration of Care: Collaboration of Care: Primary Care Provider AEB notes will be shared with PCP at patient's request  Patient/Guardian was advised Release of Information must be obtained prior to any record release in order to collaborate their care with an outside provider. Patient/Guardian was advised if they have not already done so to contact the registration department to sign all necessary forms in order for us  to release information regarding their care.   Consent: Patient/Guardian gives verbal consent for treatment and assignment of benefits for services provided during this visit. Patient/Guardian expressed understanding and agreed to proceed.    Alfredia Annas, MD 02/28/2024, 1:29 PM

## 2024-03-27 ENCOUNTER — Ambulatory Visit (HOSPITAL_COMMUNITY): Admitting: Psychiatry

## 2024-04-04 ENCOUNTER — Encounter: Payer: Self-pay | Admitting: Urology

## 2024-04-04 ENCOUNTER — Ambulatory Visit (INDEPENDENT_AMBULATORY_CARE_PROVIDER_SITE_OTHER): Admitting: Urology

## 2024-04-04 VITALS — BP 110/66 | HR 81

## 2024-04-04 DIAGNOSIS — R351 Nocturia: Secondary | ICD-10-CM

## 2024-04-04 DIAGNOSIS — R972 Elevated prostate specific antigen [PSA]: Secondary | ICD-10-CM | POA: Diagnosis not present

## 2024-04-04 DIAGNOSIS — N5201 Erectile dysfunction due to arterial insufficiency: Secondary | ICD-10-CM

## 2024-04-04 DIAGNOSIS — N138 Other obstructive and reflux uropathy: Secondary | ICD-10-CM

## 2024-04-04 DIAGNOSIS — N401 Enlarged prostate with lower urinary tract symptoms: Secondary | ICD-10-CM | POA: Diagnosis not present

## 2024-04-04 LAB — URINALYSIS, ROUTINE W REFLEX MICROSCOPIC
Bilirubin, UA: NEGATIVE
Glucose, UA: NEGATIVE
Ketones, UA: NEGATIVE
Leukocytes,UA: NEGATIVE
Nitrite, UA: NEGATIVE
Protein,UA: NEGATIVE
Specific Gravity, UA: 1.02 (ref 1.005–1.030)
Urobilinogen, Ur: 1 mg/dL (ref 0.2–1.0)
pH, UA: 6 (ref 5.0–7.5)

## 2024-04-04 LAB — MICROSCOPIC EXAMINATION: Bacteria, UA: NONE SEEN

## 2024-04-04 LAB — BLADDER SCAN AMB NON-IMAGING: Scan Result: 25

## 2024-04-04 MED ORDER — SILDENAFIL CITRATE 100 MG PO TABS
100.0000 mg | ORAL_TABLET | Freq: Every day | ORAL | 0 refills | Status: AC | PRN
Start: 2024-04-04 — End: ?

## 2024-04-04 MED ORDER — TAMSULOSIN HCL 0.4 MG PO CAPS
0.4000 mg | ORAL_CAPSULE | Freq: Every day | ORAL | 11 refills | Status: DC
Start: 2024-04-04 — End: 2024-08-10

## 2024-04-04 NOTE — Progress Notes (Signed)
post void residual =25

## 2024-04-04 NOTE — Progress Notes (Signed)
 04/04/2024 11:06 AM   Greg Kemp [birth year] [id number]  Referring provider: Shona Norleen PEDLAR, MD [id number] [place] Greg Kemp,  Greg Kemp [id number]  Elevated PSA   HPI: Greg Kemp is a 62yo here for evaluation of elevated PSA and difficulty urinating.  PSA 5.3 in 02/2024. No prior PSA values.  IPSS 24 QOL 5 on no BPH therapy. For over 1 year he has noted worsening LUTS including urge incontinence, weak stream, straining to urinate, and nocturia 2x. He has starting/stopping and urinary hesitancy. He has issues maintaining an erection for over 20 years. He has tried cialis which improved his erections but he had issues with his eyesight.    PMH: Past Medical History:  Diagnosis Date   Acid reflux    Anxiety    Cervical spinal stenosis    Chronic kidney disease    Stage III   Depression    Depression    GERD (gastroesophageal reflux disease)    Hyperlipidemia    Hypertension    Morbid obesity (HCC)    Sleep apnea    Thyroid  disease     Surgical History: Past Surgical History:  Procedure Laterality Date   COLONOSCOPY WITH ESOPHAGOGASTRODUODENOSCOPY (EGD)  2010   Dr. Shaaron: ulcerative/erosive reflux esophagitis, noncritical schatki's ring, multiple bulbar erosions with normal sb biopsies, colonic diverticulosis, normal TI   COLONOSCOPY WITH PROPOFOL  N/A 10/30/2019   Procedure: COLONOSCOPY WITH PROPOFOL ;  Surgeon: Shaaron Lamar HERO, MD;  [facility];  Service: Endoscopy;  Laterality: N/A;  1:30pm   COLONOSCOPY WITH PROPOFOL  N/A 02/16/2022   Procedure: COLONOSCOPY WITH PROPOFOL ;  Surgeon: Shaaron Lamar HERO, MD;  [facility];  Service: Endoscopy;  Laterality: N/A;  12:00pm   ESOPHAGOGASTRODUODENOSCOPY (EGD) WITH PROPOFOL  N/A 02/16/2022   Procedure: ESOPHAGOGASTRODUODENOSCOPY (EGD) WITH PROPOFOL ;  Surgeon: Shaaron Lamar HERO, MD;  [facility];  Service: Endoscopy;  Laterality: N/A;   POLYPECTOMY  10/30/2019   Procedure: POLYPECTOMY;  Surgeon: Shaaron Lamar HERO, MD;   [facility];  Service: Endoscopy;;   POLYPECTOMY  02/16/2022   Procedure: POLYPECTOMY INTESTINAL;  Surgeon: Shaaron Lamar HERO, MD;  [facility];  Service: Endoscopy;;   TONSILLECTOMY     Upper and Lower GI      Home Medications:  Allergies as of 04/04/2024   No Known Allergies      Medication List        Accurate as of April 04, 2024 11:06 AM. If you have any questions, ask your nurse or doctor.          ALPRAZolam  1 MG tablet Commonly known as: XANAX  Take 1 tablet (1 mg total) by mouth daily as needed for anxiety.   amLODipine 10 MG tablet Commonly known as: NORVASC Take 5 mg by mouth daily.   cyclobenzaprine 10 MG tablet Commonly known as: FLEXERIL Take 10 mg by mouth 2 (two) times daily as needed for muscle spasms.   DULoxetine  60 MG capsule Commonly known as: CYMBALTA  Take 1 capsule (60 mg total) by mouth daily.   furosemide 40 MG tablet Commonly known as: LASIX Take 40 mg by mouth as needed.   gabapentin  300 MG capsule Commonly known as: NEURONTIN  Take 1 capsule (300 mg total) by mouth 3 (three) times daily as needed (pain).   levocetirizine 5 MG tablet Commonly known as: XYZAL Take 5 mg by mouth daily.   levothyroxine 137 MCG tablet Commonly known as: SYNTHROID Take 137 mcg by mouth daily.  multivitamin with minerals Tabs tablet Take 1 tablet by mouth daily.   olmesartan 40 MG tablet Commonly known as: BENICAR Take 40 mg by mouth daily.   pantoprazole 40 MG tablet Commonly known as: PROTONIX Take 40 mg by mouth daily.   potassium chloride SA 20 MEQ tablet Commonly known as: KLOR-CON M Take 20 mEq by mouth as needed.   traZODone  100 MG tablet Commonly known as: DESYREL  Take 2 tablets (200 mg total) by mouth at bedtime.        Allergies: No Known Allergies  Family History: Family History  Problem Relation Age of Onset   Alcohol abuse Father    Colon cancer Father    COPD Father    Depression Daughter         acute   Sexual abuse Daughter    Lung disease Mother    COPD Mother    Thyroid  disease Mother    Alcohol abuse Paternal Uncle    Colon cancer Daughter 50   ADD / ADHD Neg Hx    Anxiety disorder Neg Hx    OCD Neg Hx    Drug abuse Neg Hx    Bipolar disorder Neg Hx    Dementia Neg Hx    Paranoid behavior Neg Hx    Schizophrenia Neg Hx    Seizures Neg Hx    Physical abuse Neg Hx     Social History:  reports that he has never smoked. He has never used smokeless tobacco. He reports that he does not currently use alcohol. He reports that he does not use drugs.  ROS: All other review of systems were reviewed and are negative except what is noted above in HPI  Physical Exam: BP 110/66   Pulse 81   Constitutional:  Alert and oriented, No acute distress. HEENT: [place] AT, moist mucus membranes.  Trachea midline, no masses. Cardiovascular: No clubbing, cyanosis, or edema. Respiratory: Normal respiratory effort, no increased work of breathing. GI: Abdomen is soft, nontender, nondistended, no abdominal masses GU: No CVA tenderness. Circumcised phallus. No masses/lesions on penis, testis, scrotum. Prostate 40g smooth no nodules no induration.  Lymph: No cervical or inguinal lymphadenopathy. Skin: No rashes, bruises or suspicious lesions. Neurologic: Grossly intact, no focal deficits, moving all 4 extremities. Psychiatric: Normal mood and affect.  Laboratory Data: Lab Results  Component Value Date   WBC 5.4 02/14/2022   HGB 12.6 (L) 02/14/2022   HCT 39.6 02/14/2022   MCV 83.0 02/14/2022   PLT 192 02/14/2022    Lab Results  Component Value Date   CREATININE 1.25 (H) 10/28/2019    No results found for: PSA  No results found for: TESTOSTERONE   No results found for: HGBA1C  Urinalysis No results found for: COLORURINE, APPEARANCEUR, LABSPEC, PHURINE, GLUCOSEU, HGBUR, BILIRUBINUR, KETONESUR, PROTEINUR, UROBILINOGEN, NITRITE, LEUKOCYTESUR  No results  found for: LABMICR, WBCUA, RBCUA, LABEPIT, MUCUS, BACTERIA  Pertinent Imaging:  No results found for this or any previous visit.  No results found for this or any previous visit.  No results found for this or any previous visit.  No results found for this or any previous visit.  No results found for this or any previous visit.  No results found for this or any previous visit.  No results found for this or any previous visit.  No results found for this or any previous visit.   Assessment & Plan:    1. Elevated PSA (Primary) Iso PSA, will call with results. If his IsoPSA is normal  we will recheck his PSA in 6 months. If the IsoPSA is elevated we will proceed with prostate biopsy - Urinalysis, Routine w reflex microscopic - BLADDER SCAN AMB NON-IMAGING  2. BPH with weak urinary stream -we will trial flomax 0.4mg  daily  3. Erectile dysfunction -we will trial sildenafil 100mg    No follow-ups on file.  Belvie Clara, MD  [facility] [place]

## 2024-04-04 NOTE — Patient Instructions (Signed)

## 2024-04-14 ENCOUNTER — Telehealth: Payer: Self-pay

## 2024-04-14 NOTE — Telephone Encounter (Signed)
 Patient made aware of IsoPsa testing error and the need to recollect blood sample. Appointment scheduled.

## 2024-04-17 ENCOUNTER — Ambulatory Visit

## 2024-04-17 DIAGNOSIS — R972 Elevated prostate specific antigen [PSA]: Secondary | ICD-10-CM | POA: Diagnosis not present

## 2024-04-17 NOTE — Progress Notes (Addendum)
 Patient seen today to get labs redrawn

## 2024-04-28 ENCOUNTER — Encounter: Payer: Self-pay | Admitting: Urology

## 2024-04-30 ENCOUNTER — Telehealth: Payer: Self-pay

## 2024-04-30 DIAGNOSIS — R972 Elevated prostate specific antigen [PSA]: Secondary | ICD-10-CM

## 2024-04-30 NOTE — Telephone Encounter (Signed)
-----   Message from Belvie Clara sent at 04/29/2024  8:16 AM EDT ----- Prostate MRI ----- Message ----- From: Ann Veleria SAUNDERS, CMA Sent: 04/28/2024   4:28 PM EDT To: Belvie LITTIE Clara, MD; [facility], LPN  ISO psa results

## 2024-04-30 NOTE — Telephone Encounter (Signed)
 Patient is aware of MD recommendation.  Prostate MRI order in and patient aware someone will be reaching out to schedule this once authorized.

## 2024-05-01 DIAGNOSIS — E039 Hypothyroidism, unspecified: Secondary | ICD-10-CM | POA: Diagnosis not present

## 2024-05-01 DIAGNOSIS — E782 Mixed hyperlipidemia: Secondary | ICD-10-CM | POA: Diagnosis not present

## 2024-05-01 DIAGNOSIS — R7301 Impaired fasting glucose: Secondary | ICD-10-CM | POA: Diagnosis not present

## 2024-05-01 DIAGNOSIS — R972 Elevated prostate specific antigen [PSA]: Secondary | ICD-10-CM | POA: Diagnosis not present

## 2024-05-05 ENCOUNTER — Other Ambulatory Visit: Payer: Self-pay

## 2024-05-05 ENCOUNTER — Emergency Department (HOSPITAL_COMMUNITY)

## 2024-05-05 ENCOUNTER — Emergency Department (HOSPITAL_COMMUNITY)
Admission: EM | Admit: 2024-05-05 | Discharge: 2024-05-05 | Disposition: A | Discharge status: Home / Self Care | Attending: Emergency Medicine | Admitting: Emergency Medicine

## 2024-05-05 DIAGNOSIS — G9389 Other specified disorders of brain: Secondary | ICD-10-CM | POA: Diagnosis not present

## 2024-05-05 DIAGNOSIS — R61 Generalized hyperhidrosis: Secondary | ICD-10-CM | POA: Diagnosis not present

## 2024-05-05 DIAGNOSIS — T675XXA Heat exhaustion, unspecified, initial encounter: Secondary | ICD-10-CM | POA: Diagnosis not present

## 2024-05-05 DIAGNOSIS — R5383 Other fatigue: Secondary | ICD-10-CM | POA: Diagnosis not present

## 2024-05-05 DIAGNOSIS — I509 Heart failure, unspecified: Secondary | ICD-10-CM | POA: Insufficient documentation

## 2024-05-05 DIAGNOSIS — I10 Essential (primary) hypertension: Secondary | ICD-10-CM | POA: Diagnosis not present

## 2024-05-05 DIAGNOSIS — N179 Acute kidney failure, unspecified: Secondary | ICD-10-CM | POA: Insufficient documentation

## 2024-05-05 DIAGNOSIS — Z79899 Other long term (current) drug therapy: Secondary | ICD-10-CM | POA: Diagnosis not present

## 2024-05-05 DIAGNOSIS — I110 Hypertensive heart disease with heart failure: Secondary | ICD-10-CM | POA: Diagnosis not present

## 2024-05-05 DIAGNOSIS — R252 Cramp and spasm: Secondary | ICD-10-CM | POA: Diagnosis present

## 2024-05-05 DIAGNOSIS — E860 Dehydration: Secondary | ICD-10-CM | POA: Diagnosis not present

## 2024-05-05 DIAGNOSIS — I959 Hypotension, unspecified: Secondary | ICD-10-CM | POA: Diagnosis not present

## 2024-05-05 DIAGNOSIS — R0902 Hypoxemia: Secondary | ICD-10-CM | POA: Diagnosis not present

## 2024-05-05 LAB — URINALYSIS, ROUTINE W REFLEX MICROSCOPIC
Bacteria, UA: NONE SEEN
Bilirubin Urine: NEGATIVE
Glucose, UA: NEGATIVE mg/dL
Hgb urine dipstick: NEGATIVE
Ketones, ur: NEGATIVE mg/dL
Leukocytes,Ua: NEGATIVE
Nitrite: NEGATIVE
Protein, ur: 30 mg/dL — AB
Specific Gravity, Urine: 1.018 (ref 1.005–1.030)
pH: 6 (ref 5.0–8.0)

## 2024-05-05 LAB — COMPREHENSIVE METABOLIC PANEL WITH GFR
ALT: 20 U/L (ref 0–44)
AST: 31 U/L (ref 15–41)
Albumin: 4 g/dL (ref 3.5–5.0)
Alkaline Phosphatase: 66 U/L (ref 38–126)
Anion gap: 13 (ref 5–15)
BUN: 17 mg/dL (ref 8–23)
CO2: 20 mmol/L — ABNORMAL LOW (ref 22–32)
Calcium: 9.8 mg/dL (ref 8.9–10.3)
Chloride: 105 mmol/L (ref 98–111)
Creatinine, Ser: 2.5 mg/dL — ABNORMAL HIGH (ref 0.61–1.24)
GFR, Estimated: 28 mL/min — ABNORMAL LOW (ref >60)
Glucose, Bld: 92 mg/dL (ref 70–99)
Potassium: 4 mmol/L (ref 3.5–5.1)
Sodium: 138 mmol/L (ref 135–145)
Total Bilirubin: 0.9 mg/dL (ref 0.0–1.2)
Total Protein: 7.5 g/dL (ref 6.5–8.1)

## 2024-05-05 LAB — BRAIN NATRIURETIC PEPTIDE: B Natriuretic Peptide: 109 pg/mL — ABNORMAL HIGH (ref 0.0–100.0)

## 2024-05-05 LAB — CBC WITH DIFFERENTIAL/PLATELET
Abs Immature Granulocytes: 0.04 K/uL (ref 0.00–0.07)
Basophils Absolute: 0.1 K/uL (ref 0.0–0.1)
Basophils Relative: 1 %
Eosinophils Absolute: 0 K/uL (ref 0.0–0.5)
Eosinophils Relative: 0 %
HCT: 39.5 % (ref 39.0–52.0)
Hemoglobin: 12.8 g/dL — ABNORMAL LOW (ref 13.0–17.0)
Immature Granulocytes: 1 %
Lymphocytes Relative: 13 %
Lymphs Abs: 1.1 K/uL (ref 0.7–4.0)
MCH: 26.1 pg (ref 26.0–34.0)
MCHC: 32.4 g/dL (ref 30.0–36.0)
MCV: 80.6 fL (ref 80.0–100.0)
Monocytes Absolute: 0.7 K/uL (ref 0.1–1.0)
Monocytes Relative: 8 %
Neutro Abs: 6.5 K/uL (ref 1.7–7.7)
Neutrophils Relative %: 77 %
Platelets: 216 K/uL (ref 150–400)
RBC: 4.9 MIL/uL (ref 4.22–5.81)
RDW: 15.8 % — ABNORMAL HIGH (ref 11.5–15.5)
WBC: 8.4 K/uL (ref 4.0–10.5)
nRBC: 0 % (ref 0.0–0.2)

## 2024-05-05 LAB — TROPONIN I (HIGH SENSITIVITY)
Troponin I (High Sensitivity): 14 ng/L (ref <18)
Troponin I (High Sensitivity): 22 ng/L — ABNORMAL HIGH (ref <18)

## 2024-05-05 LAB — MAGNESIUM: Magnesium: 2.4 mg/dL (ref 1.7–2.4)

## 2024-05-05 LAB — CK: Total CK: 138 U/L (ref 49–397)

## 2024-05-05 MED ORDER — LACTATED RINGERS IV BOLUS
1000.0000 mL | Freq: Once | INTRAVENOUS | Status: AC
  Administered 2024-05-05: 1000 mL via INTRAVENOUS

## 2024-05-05 MED ORDER — ACETAMINOPHEN 325 MG PO TABS
650.0000 mg | ORAL_TABLET | Freq: Once | ORAL | Status: AC
  Administered 2024-05-05: 650 mg via ORAL
  Filled 2024-05-05: qty 2

## 2024-05-05 MED ORDER — ONDANSETRON HCL 4 MG/2ML IJ SOLN: 4.0000 mg | Freq: Four times a day (QID) | INTRAMUSCULAR | Status: DC | PRN

## 2024-05-05 NOTE — ED Provider Notes (Signed)
 [place] [facility] AT [facility] Provider Note   CSN: [id number] Arrival date & time: 05/05/24  1745     Patient presents with: Heat Exposure   Greg Kemp is a 63 y.o. male.   HPI Patient presents for heat exhaustion.  Medical history includes HTN, HLD, CHF, GERD, anxiety, bipolar disorder, depression.  Today, he was working outside throughout the day.  He estimates he was outside for approximately 6 hours.  He developed diffuse muscle cramps.  When EMS arrived on scene, fire had already placed passive cooling measures with frozen vegetables from his freezer.  His blood pressures were initially low but improved with IV fluids.  He received 700 cc of IVF prior to arrival.  He states his blood pressure runs low at baseline.  He was previously on blood pressure medications but has been taken off of these.  He was able to ambulate with EMS to the stretcher.  He ambulated slow due to the muscle cramping but did not seem off balance.  He was normothermic with EMS.    Prior to Admission medications   Medication Sig Start Date End Date Taking? Authorizing Provider  ALPRAZolam  (XANAX ) 1 MG tablet Take 1 tablet (1 mg total) by mouth daily as needed for anxiety. 02/28/24   Okey Barnie SAUNDERS, MD  amLODipine (NORVASC) 10 MG tablet Take 5 mg by mouth daily. 06/15/23   [provider]  cyclobenzaprine (FLEXERIL) 10 MG tablet Take 10 mg by mouth 2 (two) times daily as needed for muscle spasms. 09/06/21   [provider]  DULoxetine  (CYMBALTA ) 60 MG capsule Take 1 capsule (60 mg total) by mouth daily. 02/28/24   Okey Barnie SAUNDERS, MD  furosemide (LASIX) 40 MG tablet Take 40 mg by mouth as needed. 11/01/23   [provider]  gabapentin  (NEURONTIN ) 300 MG capsule Take 1 capsule (300 mg total) by mouth 3 (three) times daily as needed (pain). 02/28/24   Okey Barnie SAUNDERS, MD  levocetirizine (XYZAL) 5 MG tablet Take 5 mg by mouth daily. 06/08/23   [provider]   levothyroxine (SYNTHROID) 137 MCG tablet Take 137 mcg by mouth daily. 06/15/23   [provider]  Multiple Vitamin (MULTIVITAMIN WITH MINERALS) TABS tablet Take 1 tablet by mouth daily.    [provider]  olmesartan (BENICAR) 40 MG tablet Take 40 mg by mouth daily. 11/01/21   [provider]  pantoprazole (PROTONIX) 40 MG tablet Take 40 mg by mouth daily.    [provider]  potassium chloride SA (KLOR-CON M) 20 MEQ tablet Take 20 mEq by mouth as needed. 08/31/23   [provider]  sildenafil  (VIAGRA ) 100 MG tablet Take 1 tablet (100 mg total) by mouth daily as needed for erectile dysfunction. 04/04/24   McKenzie, Belvie CROME, MD  tamsulosin  (FLOMAX ) 0.4 MG CAPS capsule Take 1 capsule (0.4 mg total) by mouth daily after supper. 04/04/24   McKenzie, Belvie CROME, MD  traZODone  (DESYREL ) 100 MG tablet Take 2 tablets (200 mg total) by mouth at bedtime. 02/28/24   Okey Barnie SAUNDERS, MD    Allergies: Patient has no known allergies.    Review of Systems  Constitutional:  Positive for fatigue.  Musculoskeletal:  Positive for myalgias.  Neurological:  Positive for weakness (Generalized).  All other systems reviewed and are negative.   Updated Vital Signs BP (!) 140/79   Pulse 67   Temp 97.7 F (36.5 C) (Oral)   Resp 14   Ht 6' (  1.829 m)   Wt 131 kg   SpO2 97%   BMI 39.17 kg/m   Physical Exam Vitals and nursing note reviewed.  Constitutional:      General: He is not in acute distress.    Appearance: Normal appearance. He is well-developed. He is not ill-appearing, toxic-appearing or diaphoretic.  HENT:     Head: Normocephalic and atraumatic.     Right Ear: External ear normal.     Left Ear: External ear normal.     Nose: Nose normal.     Mouth/Throat:     Mouth: Mucous membranes are moist.  Eyes:     Extraocular Movements: Extraocular movements intact.     Conjunctiva/sclera: Conjunctivae normal.  Cardiovascular:     Rate and Rhythm: Normal  rate and regular rhythm.  Pulmonary:     Effort: Pulmonary effort is normal. No respiratory distress.  Abdominal:     General: There is no distension.     Palpations: Abdomen is soft.     Tenderness: There is no abdominal tenderness.  Musculoskeletal:        General: No swelling. Normal range of motion.     Cervical back: Normal range of motion and neck supple.  Skin:    General: Skin is warm and dry.  Neurological:     General: No focal deficit present.     Mental Status: He is alert and oriented to person, place, and time.     Cranial Nerves: No cranial nerve deficit.     Sensory: No sensory deficit.     Motor: No weakness.     Coordination: Coordination normal.     Comments: Slight tremor present in bilateral upper extremities.  Psychiatric:        Mood and Affect: Mood normal.        Behavior: Behavior normal.     (all labs ordered are listed, but only abnormal results are displayed) Labs Reviewed  COMPREHENSIVE METABOLIC PANEL WITH GFR - Abnormal; Notable for the following components:      Result Value   CO2 20 (*)    Creatinine, Ser 2.50 (*)    GFR, Estimated 28 (*)    All other components within normal limits  BRAIN NATRIURETIC PEPTIDE - Abnormal; Notable for the following components:   B Natriuretic Peptide 109.0 (*)    All other components within normal limits  CBC WITH DIFFERENTIAL/PLATELET - Abnormal; Notable for the following components:   Hemoglobin 12.8 (*)    RDW 15.8 (*)    All other components within normal limits  URINALYSIS, ROUTINE W REFLEX MICROSCOPIC - Abnormal; Notable for the following components:   APPearance HAZY (*)    Protein, ur 30 (*)    All other components within normal limits  TROPONIN I (HIGH SENSITIVITY) - Abnormal; Notable for the following components:   Troponin I (High Sensitivity) 22 (*)    All other components within normal limits  MAGNESIUM  CK  TROPONIN I (HIGH SENSITIVITY)    EKG: EKG Interpretation Date/Time:  Monday  May 05 2024 18:47:55 EDT Ventricular Rate:  71 PR Interval:  190 QRS Duration:  92 QT Interval:  386 QTC Calculation: 420 R Axis:   72  Text Interpretation: Sinus rhythm Anteroseptal infarct, age indeterminate Lateral leads are also involved Confirmed by Melvenia Motto [phone]) on 05/05/2024 7:15:07 PM  Radiology: CT Head Wo Contrast Result Date: 05/05/2024 CLINICAL DATA:  Working in the yard for 6 hours when he started cramping all over. EXAM: CT HEAD  WITHOUT CONTRAST TECHNIQUE: Contiguous axial images were obtained from the base of the skull through the vertex without intravenous contrast. RADIATION DOSE REDUCTION: This exam was performed according to the departmental dose-optimization program which includes automated exposure control, adjustment of the mA and/or kV according to patient size and/or use of iterative reconstruction technique. COMPARISON:  May 25, 2023 FINDINGS: Brain: There is generalized cerebral atrophy with widening of the extra-axial spaces and ventricular dilatation. There are areas of decreased attenuation within the white matter tracts of the supratentorial brain, consistent with microvascular disease changes. Vascular: No hyperdense vessel or unexpected calcification. Skull: Normal. Negative for fracture or focal lesion. Sinuses/Orbits: No acute finding. Other: None. IMPRESSION: 1. Generalized cerebral atrophy and microvascular disease changes of the supratentorial brain. 2. No acute intracranial abnormality. Electronically Signed   By: Suzen Dials M.D.   On: 05/05/2024 19:35   DG Chest Portable 1 View Result Date: 05/05/2024 CLINICAL DATA:  Fatigue EXAM: PORTABLE CHEST 1 VIEW COMPARISON:  Chest x-ray 06/15/2010 FINDINGS: The heart size and mediastinal contours are within normal limits. Both lungs are clear. The visualized skeletal structures are unremarkable. IMPRESSION: No active disease. Electronically Signed   By: Greig Pique M.D.   On: 05/05/2024 19:07      Procedures   Medications Ordered in the ED  ondansetron  (ZOFRAN ) injection 4 mg (has no administration in time range)  lactated ringers  bolus 1,000 mL (0 mLs Intravenous Stopped 05/05/24 2144)  acetaminophen  (TYLENOL ) tablet 650 mg (650 mg Oral Given 05/05/24 2103)  lactated ringers  bolus 1,000 mL (0 mLs Intravenous Stopped 05/05/24 2144)                                    Medical Decision Making Amount and/or Complexity of Data Reviewed Labs: ordered. Radiology: ordered.  Risk OTC drugs. Prescription drug management.   This patient presents to the ED for concern of heat exhaustion, this involves an extensive number of treatment options, and is a complaint that carries with it a high risk of complications and morbidity.  The differential diagnosis includes heat exhaustion, heat stroke, dehydration, metabolic derangements   Co morbidities / Chronic conditions that complicate the patient evaluation  HTN, HLD, CHF, GERD, anxiety, bipolar disorder, depression   Additional history obtained:  Additional history obtained from EMR External records from outside source obtained and reviewed including N/A   Lab Tests:  I Ordered, and personally interpreted labs.  The pertinent results include: AKI is present.  Lab work is otherwise unremarkable.   Imaging Studies ordered:  I ordered imaging studies including chest x-ray, CT head I independently visualized and interpreted imaging which showed no acute findings I agree with the radiologist interpretation   Cardiac Monitoring: / EKG:  The patient was maintained on a cardiac monitor.  I personally viewed and interpreted the cardiac monitored which showed an underlying rhythm of: Sinus rhythm   Problem List / ED Course / Critical interventions / Medication management  Patient presenting for heat exhaustion.  After working all day outside in the heat, he did about diffuse muscle cramps.  He was normothermic with EMS.  He  received 700 cc of IVF prior to arrival.  On arrival he is alert and oriented.  Breathing is unlabored.  He has no focal deficits on exam.  He does have a mild tremor in his bilateral upper extremities.  Patient states on monitor.  Workup was initiated.  Additional  IV fluids were ordered.  Lab work notable for AKI.  Creatinine today is 2.5.  Per chart review, it was 1.83 in April.  Further IV fluids were ordered.  On reassessment, patient feels much improved.  He endorses a mild headache and Tylenol  was ordered.  Patient was informed of lab results.  He was offered admission for ongoing IV hydration.  Patient states that he prefers to hydrate at home.  He states that he has a PCP appointment tomorrow and can get repeat lab work at that time.  Repeat troponin increased slightly from 14 to 22.  Not consistent with endorgan damage.  Patient was discharged in stable condition. I ordered medication including IV fluids for hydration Reevaluation of the patient after these medicines showed that the patient improved I have reviewed the patients home medicines and have made adjustments as needed  Social Determinants of Health:  Has PCP, including appointment tomorrow     Final diagnoses:  Dehydration  AKI (acute kidney injury) [facility])    ED Discharge Orders     None          Melvenia Motto, MD 05/05/24 2213

## 2024-05-05 NOTE — Discharge Instructions (Signed)
 Avoid prolonged strenuous activity in the heat.  This can be a major strain on your heart.  It can also quickly lead to dehydration.  Your lab work today shows decreased kidney function from dehydration.  You were given IV fluids in the emergency department.  It is important that you continue to stay hydrated at home.  Follow-up with your PCP tomorrow for repeat lab work.  Return to the emergency department for any new or worsening symptoms of concern.

## 2024-05-05 NOTE — ED Triage Notes (Signed)
 Pt BIB [facility] for heat exposure. Pt was working in the yard for 6 hours when he started cramping all over.

## 2024-05-06 ENCOUNTER — Encounter: Payer: Self-pay | Admitting: Internal Medicine

## 2024-05-06 DIAGNOSIS — E782 Mixed hyperlipidemia: Secondary | ICD-10-CM | POA: Diagnosis not present

## 2024-05-06 DIAGNOSIS — I10 Essential (primary) hypertension: Secondary | ICD-10-CM | POA: Diagnosis not present

## 2024-05-06 DIAGNOSIS — R600 Localized edema: Secondary | ICD-10-CM | POA: Diagnosis not present

## 2024-05-06 DIAGNOSIS — D509 Iron deficiency anemia, unspecified: Secondary | ICD-10-CM | POA: Diagnosis not present

## 2024-05-06 DIAGNOSIS — I130 Hypertensive heart and chronic kidney disease with heart failure and stage 1 through stage 4 chronic kidney disease, or unspecified chronic kidney disease: Secondary | ICD-10-CM | POA: Diagnosis not present

## 2024-05-06 DIAGNOSIS — I509 Heart failure, unspecified: Secondary | ICD-10-CM | POA: Diagnosis not present

## 2024-05-06 DIAGNOSIS — R002 Palpitations: Secondary | ICD-10-CM | POA: Diagnosis not present

## 2024-05-06 DIAGNOSIS — N1831 Chronic kidney disease, stage 3a: Secondary | ICD-10-CM | POA: Diagnosis not present

## 2024-05-06 DIAGNOSIS — E860 Dehydration: Secondary | ICD-10-CM | POA: Diagnosis not present

## 2024-05-07 ENCOUNTER — Ambulatory Visit (HOSPITAL_COMMUNITY)
Admission: RE | Admit: 2024-05-07 | Discharge: 2024-05-07 | Disposition: A | Discharge status: Home / Self Care | Source: Ambulatory Visit | Attending: Urology | Admitting: Urology

## 2024-05-07 DIAGNOSIS — R972 Elevated prostate specific antigen [PSA]: Secondary | ICD-10-CM | POA: Diagnosis not present

## 2024-05-07 DIAGNOSIS — N400 Enlarged prostate without lower urinary tract symptoms: Secondary | ICD-10-CM | POA: Diagnosis not present

## 2024-05-07 MED ORDER — GADOBUTROL 1 MMOL/ML IV SOLN
10.0000 mL | Freq: Once | INTRAVENOUS | Status: AC | PRN
  Administered 2024-05-07: 10 mL via INTRAVENOUS

## 2024-05-27 ENCOUNTER — Ambulatory Visit: Payer: Self-pay

## 2024-05-27 DIAGNOSIS — R972 Elevated prostate specific antigen [PSA]: Secondary | ICD-10-CM

## 2024-05-27 NOTE — Telephone Encounter (Signed)
-----   Message from Greg Kemp sent at 05/27/2024 10:36 AM EDT ----- MRI shows a small lesion that has a 6% chance of cancer. [facility] to [facility] for [facility] biopsy ----- Message ----- From: Interface, Rad Results In Sent: 05/07/2024  11:00 AM EDT To: Greg LITTIE Clara, MD

## 2024-05-27 NOTE — Telephone Encounter (Signed)
 Wife called and made aware of referral to [facility] for biopsy. All information faxed over to [facility] with confirmation.

## 2024-06-10 DIAGNOSIS — R972 Elevated prostate specific antigen [PSA]: Secondary | ICD-10-CM | POA: Diagnosis not present

## 2024-06-10 DIAGNOSIS — C61 Malignant neoplasm of prostate: Secondary | ICD-10-CM | POA: Diagnosis not present

## 2024-06-12 ENCOUNTER — Encounter: Payer: Self-pay | Admitting: Urology

## 2024-06-20 ENCOUNTER — Encounter: Payer: Self-pay | Admitting: Urology

## 2024-06-20 ENCOUNTER — Ambulatory Visit: Admitting: Urology

## 2024-06-20 DIAGNOSIS — C61 Malignant neoplasm of prostate: Secondary | ICD-10-CM

## 2024-06-20 NOTE — Progress Notes (Signed)
 06/20/2024 1:20 PM   Greg Kemp [birth year] [id number]  Referring provider: Shona Norleen PEDLAR, MD [address] [place],  [place] [address]  Followup after prostate biopsy   HPI: Greg Kemp is 63yo here for followup after prostate biopsy. Biopsy revealed Gleason 3+4=7 in 1/12 cores and Gleason 3+3=6 in 2/12 cores. PSA 5.3. Prostate volume 43.1cc. He has moderate LUTs and is currently on flomax  0.4mg  daily   PMH: Past Medical History:  Diagnosis Date   Acid reflux    Anxiety    Cervical spinal stenosis    Chronic kidney disease    Stage III   Depression    Depression    GERD (gastroesophageal reflux disease)    Hyperlipidemia    Hypertension    Morbid obesity (HCC)    Sleep apnea    Thyroid  disease     Surgical History: Past Surgical History:  Procedure Laterality Date   COLONOSCOPY WITH ESOPHAGOGASTRODUODENOSCOPY (EGD)  2010   Dr. Shaaron: ulcerative/erosive reflux esophagitis, noncritical schatki's ring, multiple bulbar erosions with normal sb biopsies, colonic diverticulosis, normal TI   COLONOSCOPY WITH PROPOFOL  N/A 10/30/2019   Procedure: COLONOSCOPY WITH PROPOFOL ;  Surgeon: Shaaron Lamar HERO, MD;  [facility];  Service: Endoscopy;  Laterality: N/A;  1:30pm   COLONOSCOPY WITH PROPOFOL  N/A 02/16/2022   Procedure: COLONOSCOPY WITH PROPOFOL ;  Surgeon: Shaaron Lamar HERO, MD;  Location: [facility];  Service: Endoscopy;  Laterality: N/A;  12:00pm   ESOPHAGOGASTRODUODENOSCOPY (EGD) WITH PROPOFOL  N/A 02/16/2022   Procedure: ESOPHAGOGASTRODUODENOSCOPY (EGD) WITH PROPOFOL ;  Surgeon: Shaaron Lamar HERO, MD;  Location: [facility];  Service: Endoscopy;  Laterality: N/A;   POLYPECTOMY  10/30/2019   Procedure: POLYPECTOMY;  Surgeon: Shaaron Lamar HERO, MD;  [facility];  Service: Endoscopy;;   POLYPECTOMY  02/16/2022   Procedure: POLYPECTOMY INTESTINAL;  Surgeon: Shaaron Lamar HERO, MD;  [facility]: [facility];  Service: Endoscopy;;   TONSILLECTOMY     Upper and Lower  GI      Home Medications:  Allergies as of 06/20/2024   No Known Allergies      Medication List        Accurate as of June 20, 2024  1:20 PM. If you have any questions, ask your nurse or doctor.          ALPRAZolam  1 MG tablet Commonly known as: XANAX  Take 1 tablet (1 mg total) by mouth daily as needed for anxiety.   amLODipine 10 MG tablet Commonly known as: NORVASC Take 5 mg by mouth daily.   cyclobenzaprine 10 MG tablet Commonly known as: FLEXERIL Take 10 mg by mouth 2 (two) times daily as needed for muscle spasms.   DULoxetine  60 MG capsule Commonly known as: CYMBALTA  Take 1 capsule (60 mg total) by mouth daily.   furosemide 40 MG tablet Commonly known as: LASIX Take 40 mg by mouth as needed.   gabapentin  300 MG capsule Commonly known as: NEURONTIN  Take 1 capsule (300 mg total) by mouth 3 (three) times daily as needed (pain).   levocetirizine 5 MG tablet Commonly known as: XYZAL Take 5 mg by mouth daily.   levothyroxine 137 MCG tablet Commonly known as: SYNTHROID Take 137 mcg by mouth daily.   multivitamin with minerals Tabs tablet Take 1 tablet by mouth daily.   olmesartan 40 MG tablet Commonly known as: BENICAR Take 40 mg by mouth daily.   pantoprazole 40 MG tablet Commonly known as: PROTONIX Take 40 mg by mouth  daily.   potassium chloride SA 20 MEQ tablet Commonly known as: KLOR-CON M Take 20 mEq by mouth as needed.   sildenafil  100 MG tablet Commonly known as: VIAGRA  Take 1 tablet (100 mg total) by mouth daily as needed for erectile dysfunction.   tamsulosin  0.4 MG Caps capsule Commonly known as: FLOMAX  Take 1 capsule (0.4 mg total) by mouth daily after supper.   traZODone  100 MG tablet Commonly known as: DESYREL  Take 2 tablets (200 mg total) by mouth at bedtime.        Allergies: No Known Allergies  Family History: Family History  Problem Relation Age of Onset   Alcohol abuse Father    Colon cancer Father    COPD  Father    Depression Daughter        acute   Sexual abuse Daughter    Lung disease Mother    COPD Mother    Thyroid  disease Mother    Alcohol abuse Paternal Uncle    Colon cancer Daughter 84   ADD / ADHD Neg Hx    Anxiety disorder Neg Hx    OCD Neg Hx    Drug abuse Neg Hx    Bipolar disorder Neg Hx    Dementia Neg Hx    Paranoid behavior Neg Hx    Schizophrenia Neg Hx    Seizures Neg Hx    Physical abuse Neg Hx     Social History:  reports that he has never smoked. He has never used smokeless tobacco. He reports that he does not currently use alcohol. He reports that he does not use drugs.  ROS: All other review of systems were reviewed and are negative except what is noted above in HPI  Physical Exam: There were no vitals taken for this visit.  Constitutional:  Alert and oriented, No acute distress. HEENT: [place] AT, moist mucus membranes.  Trachea midline, no masses. Cardiovascular: No clubbing, cyanosis, or edema. Respiratory: Normal respiratory effort, no increased work of breathing. GI: Abdomen is soft, nontender, nondistended, no abdominal masses GU: No CVA tenderness.  Lymph: No cervical or inguinal lymphadenopathy. Skin: No rashes, bruises or suspicious lesions. Neurologic: Grossly intact, no focal deficits, moving all 4 extremities. Psychiatric: Normal mood and affect.  Laboratory Data: Lab Results  Component Value Date   WBC 8.4 05/05/2024   HGB 12.8 (L) 05/05/2024   HCT 39.5 05/05/2024   MCV 80.6 05/05/2024   PLT 216 05/05/2024    Lab Results  Component Value Date   CREATININE 2.50 (H) 05/05/2024    No results found for: PSA  No results found for: TESTOSTERONE   No results found for: HGBA1C  Urinalysis    Component Value Date/Time   COLORURINE YELLOW 05/05/2024 1925   APPEARANCEUR HAZY (A) 05/05/2024 1925   APPEARANCEUR Clear 04/04/2024 1103   LABSPEC 1.018 05/05/2024 1925   PHURINE 6.0 05/05/2024 1925   GLUCOSEU NEGATIVE 05/05/2024 1925    HGBUR NEGATIVE 05/05/2024 1925   BILIRUBINUR NEGATIVE 05/05/2024 1925   BILIRUBINUR Negative 04/04/2024 1103   KETONESUR NEGATIVE 05/05/2024 1925   PROTEINUR 30 (A) 05/05/2024 1925   NITRITE NEGATIVE 05/05/2024 1925   LEUKOCYTESUR NEGATIVE 05/05/2024 1925    Lab Results  Component Value Date   LABMICR See below: 04/04/2024   WBCUA 0-5 04/04/2024   LABEPIT 0-10 04/04/2024   BACTERIA NONE SEEN 05/05/2024    Pertinent Imaging: MRi prostate: Images reviewed and discussed with the patient  No results found for this or any previous visit.  No  results found for this or any previous visit.  No results found for this or any previous visit.  No results found for this or any previous visit.  No results found for this or any previous visit.  No results found for this or any previous visit.  No results found for this or any previous visit.  No results found for this or any previous visit.   Assessment & Plan:    1. Prostate cancer (HCC) (Primary) I discussed the natural history of intermediate risk prostate cancer with the patient and the various treatment options including active surveillance, RALP, IMRT, brachytherapy, cryotherapy, HIFU and ADT. After discussing the options the patient elects for Radical prostatectomy. I will refer him to Dr. Alvaro.      No follow-ups on file.  Belvie Clara, MD  [facility] [place]

## 2024-06-20 NOTE — Patient Instructions (Signed)
Prostate Cancer  The prostate is a small gland that produces fluid that makes up semen (seminal fluid). It is located below the bladder in men, in front of the rectum. Prostate cancer is the abnormal growth of cells in the prostate gland. What are the causes? The exact cause of this condition is not known. What increases the risk? You are more likely to develop this condition if: You are 65 years of age or older. You have a family history of prostate cancer. You have a family history of breast and ovarian cancer. You have genes that are passed from parent to child (inherited), such as BRCA1 and BRCA2. You have Lynch syndrome. African American men and men of African descent are diagnosed with prostate cancer at higher rates than other men. The reasons for this are not well understood and are likely due to a combination of genetic and environmental factors. What are the signs or symptoms? Symptoms of this condition include: Problems with urination. This may include: A weak or interrupted flow of urine. Trouble starting or stopping urination. Trouble emptying the bladder all the way. The need to urinate more often, especially at night. Blood in urine or semen. Persistent pain or discomfort in the lower back, lower abdomen, or hips. Trouble getting an erection. Weakness or numbness in the legs or feet. How is this diagnosed? This condition can be diagnosed with: A digital rectal exam. For this exam, a health care provider inserts a gloved finger into the rectum to feel the prostate gland. A blood test called a prostate-specific antigen (PSA) test. A procedure in which a sample of tissue is taken from the prostate and checked under a microscope (prostate biopsy). An imaging test called transrectal ultrasonography. Once the condition is diagnosed, tests will be done to determine how far the cancer has spread. This is called staging the cancer. Staging may involve imaging tests, such as a bone  scan, CT scan, PET scan, or MRI. Stages of prostate cancer The stages of prostate cancer are as follows: Stage 1 (I). At this stage, the cancer is found in the prostate only. The cancer is not visible on imaging tests, and it is usually found by accident, such as during prostate surgery. Stage 2 (II). At this stage, the cancer is more advanced than it is in stage 1, but the cancer has not spread outside the prostate. Stage 3 (III). At this stage, the cancer has spread beyond the outer layer of the prostate to nearby tissues. The cancer may be found in the seminal vesicles, which are near the bladder and the prostate. Stage 4 (IV). At this stage, the cancer has spread to other parts of the body, such as the lymph nodes, bones, bladder, rectum, liver, or lungs. Prostate cancer grading Prostate cancer is also graded according to how the cancer cells look under a microscope. This is called the Gleason score and the total score can range from 6-10, indicating how likely it is that the cancer will spread (metastasize) to other parts of the body. The higher the score, the greater the likelihood that the cancer will spread. Gleason 6 or lower: This indicates that the cancer cells look similar to normal prostate cells (well differentiated). Gleason 7: This indicates that the cancer cells look somewhat similar to normal prostate cells (moderately differentiated). Gleason 8, 9, or 10: This indicates that the cancer cells look very different than normal prostate cells (poorly differentiated). How is this treated? Treatment for this condition depends on several  factors, including the stage of the cancer, your age, personal preferences, and your overall health. Talk with your health care provider about treatment options that are recommended for you. Common treatments include: Observation for early stage prostate cancer (active surveillance). This involves having exams, blood tests, and in some cases, more biopsies.  For some men, this is the only treatment needed. Surgery. Types of surgeries include: Open surgery (radical prostatectomy). In this surgery, a larger incision is made to remove the prostate. A laparoscopic radical prostatectomy. This is a surgery to remove the prostate and lymph nodes through several small incisions. It is often referred to as a minimally invasive surgery. A robotic radical prostatectomy. This is laparoscopic surgery to remove the prostate and lymph nodes with the help of robotic arms that are controlled by the surgeon. Cryoablation. This is surgery to freeze and destroy cancer cells. Radiation treatment. Types of radiation treatment include: External beam radiation. This type aims beams of radiation from outside the body at the prostate to destroy cancerous cells. Brachytherapy. This type uses radioactive needles, seeds, wires, or tubes that are implanted into the prostate gland. Like external beam radiation, brachytherapy destroys cancerous cells. An advantage is that this type of radiation limits the damage to surrounding tissue and has fewer side effects. Chemotherapy. This treatment kills cancer cells or stops them from multiplying. It kills both cancer cells and normal cells. Targeted therapy. This treatment uses medicines to kill cancer cells without damaging normal cells. Hormone treatment. This treatment involves taking medicines that act on testosterone, one of the male hormones, by: Stopping your body from producing testosterone. Blocking testosterone from reaching cancer cells. Follow these instructions at home: Lifestyle Do not use any products that contain nicotine or tobacco. These products include cigarettes, chewing tobacco, and vaping devices, such as e-cigarettes. If you need help quitting, ask your health care provider. Eat a healthy diet. To do this: Eat foods that are high in fiber. These include beans, whole grains, and fresh fruits and vegetables. Limit  foods that are high in fat and sugar. These include fried or sweet foods. Treatment for prostate cancer may affect sexual function. If you have a partner, continue to have intimate moments. This may include touching, holding, hugging, and caressing your partner. Get plenty of sleep. Consider joining a support group for men who have prostate cancer. Meeting with a support group may help you learn to manage the stress of having cancer. General instructions Take over-the-counter and prescription medicines only as told by your health care provider. If you have to go to the hospital, notify your cancer specialist (oncologist). Keep all follow-up visits. This is important. Where to find more information American Cancer Society: www.cancer.[place] [employer]: www.cancer.net [employer]: www.cancer.gov Contact a health care provider if: You have new or increasing trouble urinating. You have new or increasing blood in your urine. You have new or increasing pain in your hips, back, or chest. Get help right away if: You have weakness or numbness in your legs. You cannot control urination or your bowel movements (incontinence). You have chills or a fever. Summary The prostate is a small gland that is involved in the production of semen. It is located below a man's bladder, in front of the rectum. Prostate cancer is the abnormal growth of cells in the prostate gland. Treatment for this condition depends on the stage of the cancer, your age, personal preferences, and your overall health. Talk with your health care provider about  treatment options that are recommended for you. Consider joining a support group for men who have prostate cancer. Meeting with a support group may help you learn to manage the stress of having cancer. This information is not intended to replace advice given to you by your health care provider. Make sure you discuss any questions you have with  your health care provider. Document Revised: 12/22/2020 Document Reviewed: 12/22/2020 Elsevier Patient Education  2024 [employer].

## 2024-06-24 ENCOUNTER — Telehealth (HOSPITAL_COMMUNITY): Payer: Self-pay

## 2024-06-24 NOTE — Telephone Encounter (Signed)
 Laquita pt's wife called in stating that pt was just diagnosed with pancreatic cancer and has been taking his xanax  more frequently at least twice daily. Next rx due to fill 06/29/24 and he is out due to tking more. They are wanting to see about getting a rx to increase

## 2024-06-25 ENCOUNTER — Other Ambulatory Visit (HOSPITAL_COMMUNITY): Payer: Self-pay | Admitting: Psychiatry

## 2024-06-25 ENCOUNTER — Telehealth: Payer: Self-pay | Admitting: Radiation Oncology

## 2024-06-25 MED ORDER — ALPRAZOLAM 1 MG PO TABS: 1.0000 mg | ORAL_TABLET | Freq: Three times a day (TID) | ORAL | 2 refills | Status: DC | PRN

## 2024-06-25 NOTE — Telephone Encounter (Signed)
 Spoke with laquita pt's wife advised of Dr Nada message she verbalized understanding

## 2024-06-25 NOTE — Telephone Encounter (Signed)
 9/17 Received voicemail from patient's spouse to get

## 2024-06-25 NOTE — Telephone Encounter (Signed)
 Just for the record, he has prostate cancer not pancreatic. I did increase the xanax  to one tid

## 2024-06-30 ENCOUNTER — Ambulatory Visit (HOSPITAL_COMMUNITY): Admitting: Psychiatry

## 2024-07-01 DIAGNOSIS — R972 Elevated prostate specific antigen [PSA]: Secondary | ICD-10-CM | POA: Diagnosis not present

## 2024-07-01 DIAGNOSIS — C61 Malignant neoplasm of prostate: Secondary | ICD-10-CM | POA: Diagnosis not present

## 2024-07-03 ENCOUNTER — Other Ambulatory Visit: Payer: Self-pay | Admitting: Urology

## 2024-07-03 DIAGNOSIS — R972 Elevated prostate specific antigen [PSA]: Secondary | ICD-10-CM | POA: Diagnosis not present

## 2024-07-03 DIAGNOSIS — C61 Malignant neoplasm of prostate: Secondary | ICD-10-CM | POA: Diagnosis not present

## 2024-07-04 ENCOUNTER — Ambulatory Visit: Admitting: Urology

## 2024-07-31 NOTE — Patient Instructions (Signed)
 SURGICAL WAITING ROOM VISITATION  Patients having surgery or a procedure may have no more than 2 support people in the waiting area - these visitors may rotate.    Children under the age of 31 must have an adult with them who is not the patient.  Visitors with respiratory illnesses are discouraged from visiting and should remain at home.  If the patient needs to stay at the hospital during part of their recovery, the visitor guidelines for inpatient rooms apply. Pre-op nurse will coordinate an appropriate time for 1 support person to accompany patient in pre-op.  This support person may not rotate.    Please refer to the [facility] website for the visitor guidelines for Inpatients (after your surgery is over and you are in a regular room).       Your procedure is scheduled on: 08/08/24   Report to [facility] Main Entrance    Report to admitting at : 8:45 AM   Call this number if you have problems the morning of surgery [phone]   .Clear liquids starting the day before surgery until : 8:00 AM DAY OF SURGERY  Water Non-Citrus Juices (without pulp, NO RED-Apple, White grape, White cranberry) Black Coffee (NO MILK/CREAM OR CREAMERS, sugar ok)  Clear Tea (NO MILK/CREAM OR CREAMERS, sugar ok) regular and decaf                             Plain Jell-O (NO RED)                                           Fruit ices (not with fruit pulp, NO RED)                                     Popsicles (NO RED)                                                               Sports drinks like Gatorade (NO RED)        FOLLOW BOWEL PREP AND ANY ADDITIONAL PRE OP INSTRUCTIONS YOU RECEIVED FROM YOUR SURGEON'S OFFICE!!!   Oral Hygiene is also important to reduce your risk of infection.                                    Remember - BRUSH YOUR TEETH THE MORNING OF SURGERY WITH YOUR REGULAR TOOTHPASTE  DENTURES WILL BE REMOVED PRIOR TO SURGERY PLEASE DO NOT APPLY Poly grip OR  ADHESIVES!!!   Do NOT smoke after Midnight   Stop all vitamins and herbal supplements 7 days before surgery.   Take these medicines the morning of surgery with A SIP OF WATER: duloxetine ,levothyroxine,pantoprazole.Alprazolam  as needed.  Bring CPAP mask and tubing day of surgery.                              You may not have any metal on your body including  hair pins, jewelry, and body piercing             Do not wear lotions, powders, perfumes/cologne, or deodorant              Men may shave face and neck.   Do not bring valuables to the hospital. [facility] IS NOT             RESPONSIBLE   FOR VALUABLES.   Contacts, glasses, dentures or bridgework may not be worn into surgery.   Bring small overnight bag day of surgery.   DO NOT BRING YOUR HOME MEDICATIONS TO THE HOSPITAL. PHARMACY WILL DISPENSE MEDICATIONS LISTED ON YOUR MEDICATION LIST TO YOU DURING YOUR ADMISSION IN THE HOSPITAL!    Patients discharged on the day of surgery will not be allowed to drive home.  Someone NEEDS to stay with you for the first 24 hours after anesthesia.   Special Instructions: Bring a copy of your healthcare power of attorney and living will documents the day of surgery if you haven't scanned them before.              Please read over the following fact sheets you were given: IF YOU HAVE QUESTIONS ABOUT YOUR PRE-OP INSTRUCTIONS PLEASE CALL [phone].   If you received a COVID test during your pre-op visit  it is requested that you wear a mask when out in public, stay away from anyone that may not be feeling well and notify your surgeon if you develop symptoms. If you test positive for Covid or have been in contact with anyone that has tested positive in the last 10 days please notify you surgeon.    [place] - Preparing for Surgery Before surgery, you can play an important role.  Because skin is not sterile, your skin needs to be as free of germs as possible.  You can reduce the number of germs on  your skin by washing with CHG (chlorahexidine gluconate) soap before surgery.  CHG is an antiseptic cleaner which kills germs and bonds with the skin to continue killing germs even after washing. Please DO NOT use if you have an allergy to CHG or antibacterial soaps.  If your skin becomes reddened/irritated stop using the CHG and inform your nurse when you arrive at Short Stay. Do not shave (including legs and underarms) for at least 48 hours prior to the first CHG shower.  You may shave your face/neck.  Please follow these instructions carefully:  1.  Shower with CHG Soap the night before surgery ONLY (DO NOT USE THE SOAP THE MORNING OF SURGERY).  2.  If you choose to wash your hair, wash your hair first as usual with your normal  shampoo.  3.  After you shampoo, rinse your hair and body thoroughly to remove the shampoo.                             4.  Use CHG as you would any other liquid soap.  You can apply chg directly to the skin and wash.  Gently with a scrungie or clean washcloth.  5.  Apply the CHG Soap to your body ONLY FROM THE NECK DOWN.   Do   not use on face/ open                           Wound or open sores. Avoid contact with  eyes, ears mouth and   genitals (private parts).                       Wash face,  Genitals (private parts) with your normal soap.             6.  Wash thoroughly, paying special attention to the area where your    surgery  will be performed.  7.  Thoroughly rinse your body with warm water from the neck down.  8.  DO NOT shower/wash with your normal soap after using and rinsing off the CHG Soap.                9.  Pat yourself dry with a clean towel.            10.  Wear clean pajamas.            11.  Place clean sheets on your bed the night of your first shower and do not  sleep with pets. Day of Surgery : Do not apply any CHG, lotions/deodorants the morning of surgery.  Please wear clean clothes to the hospital/surgery center.  FAILURE TO FOLLOW THESE  INSTRUCTIONS MAY RESULT IN THE CANCELLATION OF YOUR SURGERY  PATIENT SIGNATURE_________________________________  NURSE SIGNATURE__________________________________  ________________________________________________________________________ WHAT IS A BLOOD TRANSFUSION? Blood Transfusion Information  A transfusion is the replacement of blood or some of its parts. Blood is made up of multiple cells which provide different functions. Red blood cells carry oxygen and are used for blood loss replacement. White blood cells fight against infection. Platelets control bleeding. Plasma helps clot blood. Other blood products are available for specialized needs, such as hemophilia or other clotting disorders. BEFORE THE TRANSFUSION  Who gives blood for transfusions?  Healthy volunteers who are fully evaluated to make sure their blood is safe. This is blood bank blood. Transfusion therapy is the safest it has ever been in the practice of medicine. Before blood is taken from a donor, a complete history is taken to make sure that person has no history of diseases nor engages in risky social behavior (examples are intravenous drug use or sexual activity with multiple partners). The donor's travel history is screened to minimize risk of transmitting infections, such as malaria. The donated blood is tested for signs of infectious diseases, such as HIV and hepatitis. The blood is then tested to be sure it is compatible with you in order to minimize the chance of a transfusion reaction. If you or a relative donates blood, this is often done in anticipation of surgery and is not appropriate for emergency situations. It takes many days to process the donated blood. RISKS AND COMPLICATIONS Although transfusion therapy is very safe and saves many lives, the main dangers of transfusion include:  Getting an infectious disease. Developing a transfusion reaction. This is an allergic reaction to something in the blood you were  given. Every precaution is taken to prevent this. The decision to have a blood transfusion has been considered carefully by your caregiver before blood is given. Blood is not given unless the benefits outweigh the risks. AFTER THE TRANSFUSION Right after receiving a blood transfusion, you will usually feel much better and more energetic. This is especially true if your red blood cells have gotten low (anemic). The transfusion raises the level of the red blood cells which carry oxygen, and this usually causes an energy increase. The nurse administering the transfusion will monitor you carefully for complications. HOME  CARE INSTRUCTIONS  No special instructions are needed after a transfusion. You may find your energy is better. Speak with your caregiver about any limitations on activity for underlying diseases you may have. SEEK MEDICAL CARE IF:  Your condition is not improving after your transfusion. You develop redness or irritation at the intravenous (IV) site. SEEK IMMEDIATE MEDICAL CARE IF:  Any of the following symptoms occur over the next 12 hours: Shaking chills. You have a temperature by mouth above 102 F (38.9 C), not controlled by medicine. Chest, back, or muscle pain. People around you feel you are not acting correctly or are confused. Shortness of breath or difficulty breathing. Dizziness and fainting. You get a rash or develop hives. You have a decrease in urine output. Your urine turns a dark color or changes to pink, red, or brown. Any of the following symptoms occur over the next 10 days: You have a temperature by mouth above 102 F (38.9 C), not controlled by medicine. Shortness of breath. Weakness after normal activity. The white part of the eye turns yellow (jaundice). You have a decrease in the amount of urine or are urinating less often. Your urine turns a dark color or changes to pink, red, or brown. Document Released: 09/22/2000 Document Revised: 12/18/2011  Document Reviewed: 05/11/2008 [facility] Patient Information 2014 [place], [place].  _______________________________________________________________________

## 2024-08-01 ENCOUNTER — Encounter (HOSPITAL_COMMUNITY)
Admission: RE | Admit: 2024-08-01 | Discharge: 2024-08-01 | Disposition: A | Discharge status: Home / Self Care | Source: Ambulatory Visit | Attending: Urology | Admitting: Urology

## 2024-08-01 ENCOUNTER — Encounter (HOSPITAL_COMMUNITY): Payer: Self-pay

## 2024-08-01 ENCOUNTER — Other Ambulatory Visit: Payer: Self-pay

## 2024-08-01 DIAGNOSIS — F419 Anxiety disorder, unspecified: Secondary | ICD-10-CM | POA: Diagnosis not present

## 2024-08-01 DIAGNOSIS — K219 Gastro-esophageal reflux disease without esophagitis: Secondary | ICD-10-CM | POA: Insufficient documentation

## 2024-08-01 DIAGNOSIS — C61 Malignant neoplasm of prostate: Secondary | ICD-10-CM | POA: Insufficient documentation

## 2024-08-01 DIAGNOSIS — Z01812 Encounter for preprocedural laboratory examination: Secondary | ICD-10-CM | POA: Diagnosis not present

## 2024-08-01 DIAGNOSIS — N183 Chronic kidney disease, stage 3 unspecified: Secondary | ICD-10-CM | POA: Insufficient documentation

## 2024-08-01 DIAGNOSIS — I130 Hypertensive heart and chronic kidney disease with heart failure and stage 1 through stage 4 chronic kidney disease, or unspecified chronic kidney disease: Secondary | ICD-10-CM | POA: Insufficient documentation

## 2024-08-01 DIAGNOSIS — I5032 Chronic diastolic (congestive) heart failure: Secondary | ICD-10-CM | POA: Diagnosis not present

## 2024-08-01 DIAGNOSIS — G4733 Obstructive sleep apnea (adult) (pediatric): Secondary | ICD-10-CM | POA: Diagnosis not present

## 2024-08-01 DIAGNOSIS — F329 Major depressive disorder, single episode, unspecified: Secondary | ICD-10-CM | POA: Diagnosis not present

## 2024-08-01 HISTORY — DX: Hypothyroidism, unspecified: E03.9

## 2024-08-01 HISTORY — DX: Malignant (primary) neoplasm, unspecified: C80.1

## 2024-08-01 HISTORY — DX: Heart failure, unspecified: I50.9

## 2024-08-01 LAB — CBC
HCT: 40.6 % (ref 39.0–52.0)
Hemoglobin: 12.4 g/dL — ABNORMAL LOW (ref 13.0–17.0)
MCH: 25.3 pg — ABNORMAL LOW (ref 26.0–34.0)
MCHC: 30.5 g/dL (ref 30.0–36.0)
MCV: 82.9 fL (ref 80.0–100.0)
Platelets: 202 K/uL (ref 150–400)
RBC: 4.9 MIL/uL (ref 4.22–5.81)
RDW: 17.2 % — ABNORMAL HIGH (ref 11.5–15.5)
WBC: 5 K/uL (ref 4.0–10.5)
nRBC: 0 % (ref 0.0–0.2)

## 2024-08-01 LAB — BASIC METABOLIC PANEL WITH GFR
Anion gap: 9 (ref 5–15)
BUN: 24 mg/dL — ABNORMAL HIGH (ref 8–23)
CO2: 26 mmol/L (ref 22–32)
Calcium: 9.3 mg/dL (ref 8.9–10.3)
Chloride: 103 mmol/L (ref 98–111)
Creatinine, Ser: 1.86 mg/dL — ABNORMAL HIGH (ref 0.61–1.24)
GFR, Estimated: 40 mL/min — ABNORMAL LOW (ref >60)
Glucose, Bld: 105 mg/dL — ABNORMAL HIGH (ref 70–99)
Potassium: 4.4 mmol/L (ref 3.5–5.1)
Sodium: 137 mmol/L (ref 135–145)

## 2024-08-01 NOTE — Progress Notes (Signed)
 Lab. Results: Creatinine: 1.86

## 2024-08-01 NOTE — Progress Notes (Signed)
 For Anesthesia: PCP - Shona Norleen PEDLAR, MD  Cardiologist - Stacia Diannah SQUIBB, MD . ARNETTA: 01/14/24  Bowel Prep reminder:  Chest x-ray - 05/05/24   CT Cardiac: 02/23/23 EKG - 05/05/24 Stress Test -  ECHO - 01/28/24 Cardiac Cath -  Pacemaker/ICD device last checked: Pacemaker orders received: Device Rep notified:  Spinal Cord Stimulator:N/A  Sleep Study - Yes CPAP - NO  Fasting Blood Sugar - N/A Checks Blood Sugar _____ times a day Date and result of last Hgb A1c-  Last dose of GLP1 agonist- N/A GLP1 instructions: Hold 7 days prior to schedule (Hold 24 hours-daily)   Last dose of SGLT-2 inhibitors- N/A SGLT-2 instructions: Hold 72 hours prior to surgery  Blood Thinner Instructions:N/A Last Dose: Time last taken:  Aspirin Instructions:N/A Last Dose: Time last taken:  Activity level: Can go up a flight of stairs and activities of daily living without stopping and without chest pain and/or shortness of breath   Able to exercise without chest pain and/or shortness of breath    Anesthesia review: Hx: HTN,CKD III,CHF,OSA (NO CPAP).  Patient denies shortness of breath, fever, cough and chest pain at PAT appointment   Patient verbalized understanding of instructions that were reviewed over the telephone.

## 2024-08-04 ENCOUNTER — Encounter (HOSPITAL_COMMUNITY): Payer: Self-pay

## 2024-08-04 NOTE — Progress Notes (Signed)
 Case: [id number] Date/Time: 08/08/24 1045   Procedures:      ROBOTIC ASSISTED LAPAROSCOPIC RADICAL PROSTATECTOMY     CYSTOSCOPY WITH INDOCYANINE GREEN IMAGING (ICG)     LYMPHADENECTOMY, PELVIS, ROBOT-ASSISTED   Anesthesia type: General   Diagnosis: Prostate cancer (HCC) [C61]   Pre-op diagnosis: PROSTATE CANCER   Location: [facility]OM 03 / [facility]   Surgeons: Alvaro Ricardo KATHEE Mickey., MD       DISCUSSION: Greg Kemp is a 63 yo male with PMH of HFpEF, HTN, OSA (no CPAP use), GERD, CKD3, hypothyroid, depression, anxiety.  Evaluated by Cardiology on 01/14/24 in clinic for peripheral edema. Echo was obtained on 01/28/24 and showed normal LVEF, grade I DD, no significant valvular disease. CT calcium scoring in 2024 showed no CAD. F/u scheduled for 08/25/24. Pt reports at PAT visit he can go up a flight of stairs and activities of daily living without stopping and without chest pain and/or shortness of breath.  Hx of CKD3. Had ED visit on 05/05/24 for dehydration and AKI. Kidney function improved from that visit and at baseline.    VS: BP 136/73   Pulse 70   Temp 36.9 C (Oral)   Ht 6' (1.829 m)   Wt 121.1 kg   SpO2 98%   BMI 36.21 kg/m   PROVIDERS: Shona Norleen PEDLAR, MD   LABS: Labs reviewed: Acceptable for surgery. (all labs ordered are listed, but only abnormal results are displayed)  Labs Reviewed  CBC - Abnormal; Notable for the following components:      Result Value   Hemoglobin 12.4 (*)    MCH 25.3 (*)    RDW 17.2 (*)    All other components within normal limits  BASIC METABOLIC PANEL WITH GFR - Abnormal; Notable for the following components:   Glucose, Bld 105 (*)    BUN 24 (*)    Creatinine, Ser 1.86 (*)    GFR, Estimated 40 (*)    All other components within normal limits  TYPE AND SCREEN     Echo 01/28/24:  IMPRESSIONS    1. No LV thrombus by Definity . Left ventricular ejection fraction, by estimation, is 60 to 65%. The left ventricle has normal function. The  left ventricle has no regional wall motion abnormalities. There is mild left ventricular hypertrophy. Left ventricular diastolic parameters are consistent with Grade I diastolic dysfunction (impaired relaxation).  2. Right ventricular systolic function is normal. The right ventricular size is mildly enlarged. Tricuspid regurgitation signal is inadequate for assessing PA pressure.  3. The mitral valve is normal in structure. No evidence of mitral valve regurgitation. No evidence of mitral stenosis.  4. The aortic valve is tricuspid. Aortic valve regurgitation is not visualized. No aortic stenosis is present.  5. Aortic dilatation noted. There is borderline dilatation of the aortic root, measuring 39 mm.  Past Medical History:  Diagnosis Date   Acid reflux    Anxiety    Cancer (HCC)    Cervical spinal stenosis    CHF (congestive heart failure) (HCC)    Chronic kidney disease    Stage III   Depression    Depression    GERD (gastroesophageal reflux disease)    Hyperlipidemia    Hypertension    Hypothyroidism    Morbid obesity (HCC)    Sleep apnea    Thyroid  disease     Past Surgical History:  Procedure Laterality Date   COLONOSCOPY WITH ESOPHAGOGASTRODUODENOSCOPY (EGD)  2010   Dr. Shaaron: ulcerative/erosive reflux esophagitis, noncritical  schatki's ring, multiple bulbar erosions with normal sb biopsies, colonic diverticulosis, normal TI   COLONOSCOPY WITH PROPOFOL  N/A 10/30/2019   Procedure: COLONOSCOPY WITH PROPOFOL ;  Surgeon: Shaaron Lamar HERO, MD;  [facility];  Service: Endoscopy;  Laterality: N/A;  1:30pm   COLONOSCOPY WITH PROPOFOL  N/A 02/16/2022   Procedure: COLONOSCOPY WITH PROPOFOL ;  Surgeon: Shaaron Lamar HERO, MD;  [facility];  Service: Endoscopy;  Laterality: N/A;  12:00pm   ESOPHAGOGASTRODUODENOSCOPY (EGD) WITH PROPOFOL  N/A 02/16/2022   Procedure: ESOPHAGOGASTRODUODENOSCOPY (EGD) WITH PROPOFOL ;  Surgeon: Shaaron Lamar HERO, MD;  [facility];  Service: Endoscopy;  Laterality: N/A;   POLYPECTOMY  10/30/2019   Procedure: POLYPECTOMY;  Surgeon: Shaaron Lamar HERO, MD;  [facility];  Service: Endoscopy;;   POLYPECTOMY  02/16/2022   Procedure: POLYPECTOMY INTESTINAL;  Surgeon: Shaaron Lamar HERO, MD;  [facility];  Service: Endoscopy;;   TONSILLECTOMY     Upper and Lower GI     WISDOM TOOTH EXTRACTION      MEDICATIONS:  ALPRAZolam  (XANAX ) 1 MG tablet   cyclobenzaprine (FLEXERIL) 10 MG tablet   diphenhydrAMINE (BENADRYL) 25 MG tablet   DULoxetine  (CYMBALTA ) 60 MG capsule   gabapentin  (NEURONTIN ) 300 MG capsule   GLUCOSAMINE-CHONDROITIN PO   levothyroxine (SYNTHROID) 137 MCG tablet   pantoprazole (PROTONIX) 40 MG tablet   sildenafil  (VIAGRA ) 100 MG tablet   tamsulosin  (FLOMAX ) 0.4 MG CAPS capsule   traZODone  (DESYREL ) 100 MG tablet   No current facility-administered medications for this encounter.   Burnard HERO Odis DEVONNA [facility] Short Stay/Anesthesiology [facility] Phone [phone] 08/04/2024 1:21 PM

## 2024-08-04 NOTE — Anesthesia Preprocedure Evaluation (Signed)
 Anesthesia Evaluation  Patient identified by MRN, date of birth, ID band Patient awake    Reviewed: Allergy & Precautions, H&P , NPO status , Patient's Chart, lab work & pertinent test results  History of Anesthesia Complications Negative for: history of anesthetic complications  Airway Mallampati: II  TM Distance: >3 FB Neck ROM: Full    Dental no notable dental hx.    Pulmonary sleep apnea    Pulmonary exam normal breath sounds clear to auscultation       Cardiovascular hypertension, +CHF  Normal cardiovascular exam Rhythm:Regular Rate:Normal  TTE: IMPRESSIONS     1. No LV thrombus by Definity . Left ventricular ejection fraction, by  estimation, is 60 to 65%. The left ventricle has normal function. The left  ventricle has no regional wall motion abnormalities. There is mild left  ventricular hypertrophy. Left  ventricular diastolic parameters are consistent with Grade I diastolic  dysfunction (impaired relaxation).   2. Right ventricular systolic function is normal. The right ventricular  size is mildly enlarged. Tricuspid regurgitation signal is inadequate for  assessing PA pressure.   3. The mitral valve is normal in structure. No evidence of mitral valve  regurgitation. No evidence of mitral stenosis.   4. The aortic valve is tricuspid. Aortic valve regurgitation is not  visualized. No aortic stenosis is present.   5. Aortic dilatation noted. There is borderline dilatation of the aortic  root, measuring 39 mm.     Neuro/Psych neg Seizures PSYCHIATRIC DISORDERS Anxiety Depression Bipolar Disorder   negative neurological ROS     GI/Hepatic Neg liver ROS,GERD  ,,  Endo/Other  Hypothyroidism    Renal/GU Renal disease   Prostate cancer     Musculoskeletal negative musculoskeletal ROS (+)    Abdominal   Peds negative pediatric ROS (+)  Hematology negative hematology ROS (+)   Anesthesia Other  Findings   Reproductive/Obstetrics negative OB ROS                              Anesthesia Physical Anesthesia Plan  ASA: 3  Anesthesia Plan: General   Post-op Pain Management: Tylenol  PO (pre-op)*   Induction: Intravenous  PONV Risk Score and Plan: 2 and Ondansetron , Dexamethasone  and Treatment may vary due to age or medical condition  Airway Management Planned: Oral ETT  Additional Equipment: None  Intra-op Plan:   Post-operative Plan: Extubation in OR  Informed Consent: I have reviewed the patients History and Physical, chart, labs and discussed the procedure including the risks, benefits and alternatives for the proposed anesthesia with the patient or authorized representative who has indicated his/her understanding and acceptance.     Dental advisory given  Plan Discussed with: CRNA  Anesthesia Plan Comments: (See PAT note from 10/24  Greg Kemp is a 63 yo male with PMH of HFpEF, HTN, OSA (no CPAP use), GERD, CKD3, hypothyroid, depression, anxiety.   Evaluated by Cardiology on 01/14/24 in clinic for peripheral edema. Echo was obtained on 01/28/24 and showed normal LVEF, grade I DD, no significant valvular disease. CT calcium scoring in 2024 showed no CAD. F/u scheduled for 08/25/24. Pt reports at PAT visit he can go up a flight of stairs and activities of daily living without stopping and without chest pain and/or shortness of breath.   Hx of CKD3. Had ED visit on 05/05/24 for dehydration and AKI. Kidney function improved from that visit and at baseline.  )  Anesthesia Quick Evaluation

## 2024-08-05 ENCOUNTER — Telehealth (HOSPITAL_COMMUNITY): Payer: Self-pay

## 2024-08-05 NOTE — Telephone Encounter (Signed)
 Pts wife called asking for follow up appt. She states last appt was in September, however pt no showed for appt. Would like to schedule follow up. Called pt and no answer. Vague message left.

## 2024-08-08 ENCOUNTER — Encounter (HOSPITAL_COMMUNITY): Payer: Self-pay | Admitting: Urology

## 2024-08-08 ENCOUNTER — Other Ambulatory Visit: Payer: Self-pay

## 2024-08-08 ENCOUNTER — Encounter (HOSPITAL_COMMUNITY): Payer: Self-pay | Admitting: Medical

## 2024-08-08 ENCOUNTER — Ambulatory Visit (HOSPITAL_COMMUNITY): Admitting: Certified Registered Nurse Anesthetist

## 2024-08-08 ENCOUNTER — Observation Stay (HOSPITAL_COMMUNITY)
Admission: RE | Admit: 2024-08-08 | Discharge: 2024-08-10 | Disposition: A | Discharge status: Home / Self Care | Source: Ambulatory Visit | Attending: Urology | Admitting: Urology

## 2024-08-08 ENCOUNTER — Encounter (HOSPITAL_COMMUNITY)
Admission: RE | Disposition: A | Discharge status: Home / Self Care | Payer: Self-pay | Source: Ambulatory Visit | Attending: Urology

## 2024-08-08 DIAGNOSIS — I130 Hypertensive heart and chronic kidney disease with heart failure and stage 1 through stage 4 chronic kidney disease, or unspecified chronic kidney disease: Secondary | ICD-10-CM | POA: Diagnosis not present

## 2024-08-08 DIAGNOSIS — C61 Malignant neoplasm of prostate: Secondary | ICD-10-CM

## 2024-08-08 DIAGNOSIS — I110 Hypertensive heart disease with heart failure: Secondary | ICD-10-CM

## 2024-08-08 DIAGNOSIS — I509 Heart failure, unspecified: Secondary | ICD-10-CM

## 2024-08-08 DIAGNOSIS — E039 Hypothyroidism, unspecified: Secondary | ICD-10-CM

## 2024-08-08 DIAGNOSIS — N183 Chronic kidney disease, stage 3 unspecified: Secondary | ICD-10-CM | POA: Insufficient documentation

## 2024-08-08 HISTORY — PX: ROBOT ASSISTED LAPAROSCOPIC RADICAL PROSTATECTOMY: SHX5141

## 2024-08-08 HISTORY — PX: CYSTOSCOPY WITH INDOCYANINE GREEN IMAGING (ICG): SHX7549

## 2024-08-08 LAB — HEMOGLOBIN AND HEMATOCRIT, BLOOD
HCT: 39.8 % (ref 39.0–52.0)
Hemoglobin: 12.4 g/dL — ABNORMAL LOW (ref 13.0–17.0)

## 2024-08-08 LAB — ABO/RH: ABO/RH(D): O NEG

## 2024-08-08 LAB — HIV ANTIBODY (ROUTINE TESTING W REFLEX): HIV Screen 4th Generation wRfx: NONREACTIVE

## 2024-08-08 SURGERY — ROBOTIC ASSISTED LAPAROSCOPIC RADICAL PROSTATECTOMY
Anesthesia: General

## 2024-08-08 MED ORDER — PANTOPRAZOLE SODIUM 40 MG PO TBEC
40.0000 mg | DELAYED_RELEASE_TABLET | Freq: Every day | ORAL | Status: DC
  Administered 2024-08-08 – 2024-08-10 (×3): 40 mg via ORAL
  Filled 2024-08-08 (×3): qty 1

## 2024-08-08 MED ORDER — FENTANYL CITRATE (PF) 50 MCG/ML IJ SOSY: 25.0000 ug | PREFILLED_SYRINGE | INTRAMUSCULAR | Status: DC | PRN

## 2024-08-08 MED ORDER — CEFAZOLIN SODIUM-DEXTROSE 1-4 GM/50ML-% IV SOLN
1.0000 g | Freq: Three times a day (TID) | INTRAVENOUS | Status: AC
  Administered 2024-08-08 – 2024-08-09 (×2): 1 g via INTRAVENOUS
  Filled 2024-08-08 (×2): qty 50

## 2024-08-08 MED ORDER — BUPIVACAINE LIPOSOME 1.3 % IJ SUSP
INTRAMUSCULAR | Status: AC
  Filled 2024-08-08: qty 20

## 2024-08-08 MED ORDER — SODIUM CHLORIDE (PF) 0.9 % IJ SOLN
INTRAMUSCULAR | Status: DC | PRN
  Administered 2024-08-08: 20 mL

## 2024-08-08 MED ORDER — CEFAZOLIN SODIUM-DEXTROSE 3-4 GM/150ML-% IV SOLN
3.0000 g | INTRAVENOUS | Status: AC
  Administered 2024-08-08: 3 g via INTRAVENOUS
  Filled 2024-08-08: qty 150

## 2024-08-08 MED ORDER — ONDANSETRON HCL 4 MG/2ML IJ SOLN
INTRAMUSCULAR | Status: AC
  Filled 2024-08-08: qty 2

## 2024-08-08 MED ORDER — POLYETHYLENE GLYCOL 3350 17 G PO PACK: 17.0000 g | PACK | Freq: Every day | ORAL | Status: DC | PRN

## 2024-08-08 MED ORDER — OXYCODONE HCL 5 MG PO TABS: 5.0000 mg | ORAL_TABLET | Freq: Once | ORAL | Status: DC | PRN

## 2024-08-08 MED ORDER — ROCURONIUM BROMIDE 10 MG/ML (PF) SYRINGE
PREFILLED_SYRINGE | INTRAVENOUS | Status: DC | PRN
  Administered 2024-08-08: 70 mg via INTRAVENOUS
  Administered 2024-08-08 (×2): 10 mg via INTRAVENOUS
  Administered 2024-08-08: 20 mg via INTRAVENOUS

## 2024-08-08 MED ORDER — LACTATED RINGERS IV SOLN
INTRAVENOUS | Status: AC
Start: 2024-08-08 — End: 2024-08-09

## 2024-08-08 MED ORDER — FENTANYL CITRATE (PF) 100 MCG/2ML IJ SOLN
INTRAMUSCULAR | Status: DC | PRN
  Administered 2024-08-08: 25 ug via INTRAVENOUS
  Administered 2024-08-08 (×2): 50 ug via INTRAVENOUS

## 2024-08-08 MED ORDER — ACETAMINOPHEN 10 MG/ML IV SOLN: 1000.0000 mg | Freq: Once | INTRAVENOUS | Status: DC | PRN

## 2024-08-08 MED ORDER — TRAZODONE HCL 100 MG PO TABS
200.0000 mg | ORAL_TABLET | Freq: Every day | ORAL | Status: DC
  Administered 2024-08-08 – 2024-08-09 (×2): 200 mg via ORAL
  Filled 2024-08-08 (×2): qty 2

## 2024-08-08 MED ORDER — BUPIVACAINE LIPOSOME 1.3 % IJ SUSP
INTRAMUSCULAR | Status: DC | PRN
  Administered 2024-08-08: 20 mL

## 2024-08-08 MED ORDER — DROPERIDOL 2.5 MG/ML IJ SOLN
0.6250 mg | Freq: Once | INTRAMUSCULAR | Status: DC | PRN
Start: 2024-08-08 — End: 2024-08-08

## 2024-08-08 MED ORDER — ROCURONIUM BROMIDE 10 MG/ML (PF) SYRINGE
PREFILLED_SYRINGE | INTRAVENOUS | Status: AC
  Filled 2024-08-08: qty 10

## 2024-08-08 MED ORDER — EPHEDRINE 5 MG/ML INJ
INTRAVENOUS | Status: AC
  Filled 2024-08-08: qty 5

## 2024-08-08 MED ORDER — SODIUM CHLORIDE 0.9 % IV BOLUS
1000.0000 mL | Freq: Once | INTRAVENOUS | Status: AC
  Administered 2024-08-08: 1000 mL via INTRAVENOUS

## 2024-08-08 MED ORDER — GABAPENTIN 300 MG PO CAPS: 300.0000 mg | ORAL_CAPSULE | Freq: Three times a day (TID) | ORAL | Status: DC | PRN

## 2024-08-08 MED ORDER — ONDANSETRON HCL 4 MG/2ML IJ SOLN
4.0000 mg | INTRAMUSCULAR | Status: DC | PRN
  Administered 2024-08-08: 4 mg via INTRAVENOUS
  Filled 2024-08-08: qty 2

## 2024-08-08 MED ORDER — PHENYLEPHRINE HCL (PRESSORS) 10 MG/ML IV SOLN
INTRAVENOUS | Status: DC | PRN
  Administered 2024-08-08 (×2): 80 ug via INTRAVENOUS
  Administered 2024-08-08: 160 ug via INTRAVENOUS
  Administered 2024-08-08 (×2): 80 ug via INTRAVENOUS
  Administered 2024-08-08 (×2): 160 ug via INTRAVENOUS

## 2024-08-08 MED ORDER — CHLORHEXIDINE GLUCONATE 0.12 % MT SOLN
15.0000 mL | Freq: Once | OROMUCOSAL | Status: AC
  Administered 2024-08-08: 15 mL via OROMUCOSAL

## 2024-08-08 MED ORDER — SUGAMMADEX SODIUM 200 MG/2ML IV SOLN
INTRAVENOUS | Status: DC | PRN
  Administered 2024-08-08: 200 mg via INTRAVENOUS

## 2024-08-08 MED ORDER — SODIUM CHLORIDE (PF) 0.9 % IJ SOLN
INTRAMUSCULAR | Status: AC
  Filled 2024-08-08: qty 20

## 2024-08-08 MED ORDER — OXYCODONE HCL 5 MG PO TABS
5.0000 mg | ORAL_TABLET | ORAL | Status: DC | PRN
  Administered 2024-08-08 – 2024-08-09 (×3): 5 mg via ORAL
  Filled 2024-08-08 (×3): qty 1

## 2024-08-08 MED ORDER — CYCLOBENZAPRINE HCL 10 MG PO TABS
10.0000 mg | ORAL_TABLET | Freq: Two times a day (BID) | ORAL | Status: DC | PRN
Start: 2024-08-08 — End: 2024-08-10
  Administered 2024-08-09 (×2): 10 mg via ORAL
  Filled 2024-08-08 (×2): qty 1

## 2024-08-08 MED ORDER — ACETAMINOPHEN 10 MG/ML IV SOLN
INTRAVENOUS | Status: DC | PRN
  Administered 2024-08-08: 1000 mg via INTRAVENOUS

## 2024-08-08 MED ORDER — OXYCODONE HCL 5 MG/5ML PO SOLN: 5.0000 mg | Freq: Once | ORAL | Status: DC | PRN

## 2024-08-08 MED ORDER — SENNOSIDES-DOCUSATE SODIUM 8.6-50 MG PO TABS: 1.0000 | ORAL_TABLET | Freq: Two times a day (BID) | ORAL | 0 refills | Status: AC

## 2024-08-08 MED ORDER — LIDOCAINE HCL (PF) 2 % IJ SOLN
INTRAMUSCULAR | Status: AC
  Filled 2024-08-08: qty 5

## 2024-08-08 MED ORDER — DIPHENHYDRAMINE HCL 25 MG PO CAPS
50.0000 mg | ORAL_CAPSULE | Freq: Every day | ORAL | Status: DC
  Administered 2024-08-08 – 2024-08-09 (×2): 50 mg via ORAL
  Filled 2024-08-08 (×3): qty 2

## 2024-08-08 MED ORDER — ALPRAZOLAM 0.5 MG PO TABS
1.0000 mg | ORAL_TABLET | Freq: Three times a day (TID) | ORAL | Status: DC | PRN
  Administered 2024-08-09 (×2): 1 mg via ORAL
  Filled 2024-08-08 (×2): qty 2

## 2024-08-08 MED ORDER — FENTANYL CITRATE (PF) 100 MCG/2ML IJ SOLN
INTRAMUSCULAR | Status: AC
  Filled 2024-08-08: qty 2

## 2024-08-08 MED ORDER — CHLORHEXIDINE GLUCONATE CLOTH 2 % EX PADS
6.0000 | MEDICATED_PAD | Freq: Every day | CUTANEOUS | Status: DC
  Administered 2024-08-08 – 2024-08-10 (×2): 6 via TOPICAL

## 2024-08-08 MED ORDER — ORAL CARE MOUTH RINSE
15.0000 mL | Freq: Once | OROMUCOSAL | Status: AC
Start: 2024-08-08 — End: 2024-08-08

## 2024-08-08 MED ORDER — OXYCODONE-ACETAMINOPHEN 5-325 MG PO TABS: 1.0000 | ORAL_TABLET | Freq: Four times a day (QID) | ORAL | 0 refills | Status: AC | PRN

## 2024-08-08 MED ORDER — SULFAMETHOXAZOLE-TRIMETHOPRIM 800-160 MG PO TABS: 1.0000 | ORAL_TABLET | Freq: Two times a day (BID) | ORAL | 0 refills | Status: AC

## 2024-08-08 MED ORDER — LEVOTHYROXINE SODIUM 25 MCG PO TABS
137.0000 ug | ORAL_TABLET | Freq: Every day | ORAL | Status: DC
  Administered 2024-08-09 – 2024-08-10 (×2): 137 ug via ORAL
  Filled 2024-08-08 (×2): qty 1

## 2024-08-08 MED ORDER — MIDAZOLAM HCL 2 MG/2ML IJ SOLN
INTRAMUSCULAR | Status: AC
  Filled 2024-08-08: qty 2

## 2024-08-08 MED ORDER — MIDAZOLAM HCL 5 MG/5ML IJ SOLN
INTRAMUSCULAR | Status: DC | PRN
  Administered 2024-08-08: 2 mg via INTRAVENOUS

## 2024-08-08 MED ORDER — LACTATED RINGERS IV SOLN
INTRAVENOUS | Status: DC
Start: 2024-08-08 — End: 2024-08-08

## 2024-08-08 MED ORDER — SUGAMMADEX SODIUM 200 MG/2ML IV SOLN
INTRAVENOUS | Status: AC
  Filled 2024-08-08: qty 2

## 2024-08-08 MED ORDER — HYDROMORPHONE HCL 1 MG/ML IJ SOLN: 0.5000 mg | INTRAMUSCULAR | Status: DC | PRN

## 2024-08-08 MED ORDER — EPHEDRINE SULFATE (PRESSORS) 25 MG/5ML IV SOSY
PREFILLED_SYRINGE | INTRAVENOUS | Status: DC | PRN
  Administered 2024-08-08: 10 mg via INTRAVENOUS

## 2024-08-08 MED ORDER — PROPOFOL 10 MG/ML IV BOLUS
INTRAVENOUS | Status: DC | PRN
  Administered 2024-08-08: 150 mg via INTRAVENOUS

## 2024-08-08 MED ORDER — DULOXETINE HCL 60 MG PO CPEP
60.0000 mg | ORAL_CAPSULE | Freq: Every day | ORAL | Status: DC
  Administered 2024-08-09 – 2024-08-10 (×2): 60 mg via ORAL
  Filled 2024-08-08 (×2): qty 1

## 2024-08-08 MED ORDER — PROPOFOL 10 MG/ML IV BOLUS
INTRAVENOUS | Status: AC
  Filled 2024-08-08: qty 20

## 2024-08-08 MED ORDER — ACETAMINOPHEN 500 MG PO TABS
1000.0000 mg | ORAL_TABLET | Freq: Three times a day (TID) | ORAL | Status: DC
  Administered 2024-08-08 – 2024-08-10 (×6): 1000 mg via ORAL
  Filled 2024-08-08 (×6): qty 2

## 2024-08-08 MED ORDER — LIDOCAINE HCL (CARDIAC) PF 100 MG/5ML IV SOSY
PREFILLED_SYRINGE | INTRAVENOUS | Status: DC | PRN
  Administered 2024-08-08: 60 mg via INTRAVENOUS

## 2024-08-08 MED ORDER — ACETAMINOPHEN 10 MG/ML IV SOLN
INTRAVENOUS | Status: AC
  Filled 2024-08-08: qty 100

## 2024-08-08 MED ORDER — DEXAMETHASONE SODIUM PHOSPHATE 4 MG/ML IJ SOLN
INTRAMUSCULAR | Status: DC | PRN
  Administered 2024-08-08: 10 mg via INTRAVENOUS

## 2024-08-08 MED ORDER — ONDANSETRON HCL 4 MG/2ML IJ SOLN
INTRAMUSCULAR | Status: DC | PRN
Start: 2024-08-08 — End: 2024-08-08
  Administered 2024-08-08: 4 mg via INTRAVENOUS

## 2024-08-08 SURGICAL SUPPLY — 62 items
APPLICATOR COTTON TIP 6 STRL (MISCELLANEOUS) ×1 IMPLANT
BAG COUNTER SPONGE SURGICOUNT (BAG) IMPLANT
CATH FOLEY 2WAY SLVR 18FR 30CC (CATHETERS) ×1 IMPLANT
CATH TIEMANN FOLEY 18FR 5CC (CATHETERS) ×1 IMPLANT
CHLORAPREP W/TINT 26 (MISCELLANEOUS) ×1 IMPLANT
CLIP LIGATING HEM O LOK PURPLE (MISCELLANEOUS) ×2 IMPLANT
CNTNR URN SCR LID CUP LEK RST (MISCELLANEOUS) ×1 IMPLANT
COVER SURGICAL LIGHT HANDLE (MISCELLANEOUS) ×1 IMPLANT
COVER TIP SHEARS 8 DVNC (MISCELLANEOUS) ×1 IMPLANT
CUTTER ECHEON FLEX ENDO 45 340 (ENDOMECHANICALS) ×1 IMPLANT
DERMABOND ADVANCED .7 DNX12 (GAUZE/BANDAGES/DRESSINGS) ×1 IMPLANT
DRAPE ARM DVNC X/XI (DISPOSABLE) ×4 IMPLANT
DRAPE COLUMN DVNC XI (DISPOSABLE) ×1 IMPLANT
DRAPE INCISE IOBAN 66X45 STRL (DRAPES) IMPLANT
DRAPE SURG IRRIG POUCH 19X23 (DRAPES) ×1 IMPLANT
DRIVER NDL LRG 8 DVNC XI (INSTRUMENTS) ×2 IMPLANT
DRIVER NDLE LRG 8 DVNC XI (INSTRUMENTS) ×2 IMPLANT
DRSG TEGADERM 4X4.75 (GAUZE/BANDAGES/DRESSINGS) ×1 IMPLANT
ELECT PENCIL ROCKER SW 15FT (MISCELLANEOUS) ×1 IMPLANT
ELECT REM PT RETURN 15FT ADLT (MISCELLANEOUS) ×1 IMPLANT
FORCEPS BPLR LNG DVNC XI (INSTRUMENTS) ×1 IMPLANT
FORCEPS PROGRASP DVNC XI (FORCEP) ×1 IMPLANT
GAUZE 4X4 16PLY ~~LOC~~+RFID DBL (SPONGE) IMPLANT
GAUZE SPONGE 2X2 8PLY STRL LF (GAUZE/BANDAGES/DRESSINGS) IMPLANT
GAUZE SPONGE 4X4 12PLY STRL (GAUZE/BANDAGES/DRESSINGS) ×1 IMPLANT
GLOVE BIO SURGEON STRL SZ 6.5 (GLOVE) ×1 IMPLANT
GLOVE BIOGEL PI IND STRL 6.5 (GLOVE) IMPLANT
GLOVE BIOGEL PI IND STRL 7.5 (GLOVE) ×1 IMPLANT
GLOVE SS BIOGEL STRL SZ 6 (GLOVE) IMPLANT
GLOVE SURG LX STRL 7.5 STRW (GLOVE) ×2 IMPLANT
GOWN STRL REUS W/ TWL XL LVL3 (GOWN DISPOSABLE) ×2 IMPLANT
GOWN STRL SURGICAL XL XLNG (GOWN DISPOSABLE) ×1 IMPLANT
HOLDER FOLEY CATH W/STRAP (MISCELLANEOUS) ×1 IMPLANT
IRRIGATION SUCT STRKRFLW 2 WTP (MISCELLANEOUS) ×1 IMPLANT
IV LACTATED RINGERS 1000ML (IV SOLUTION) ×1 IMPLANT
KIT PROCEDURE DVNC SI (MISCELLANEOUS) ×1 IMPLANT
KIT TURNOVER KIT A (KITS) ×1 IMPLANT
NDL INSUFFLATION 14GA 120MM (NEEDLE) ×1 IMPLANT
NDL SPNL 22GX7 QUINCKE BK (NEEDLE) ×1 IMPLANT
NEEDLE INSUFFLATION 14GA 120MM (NEEDLE) ×1 IMPLANT
NEEDLE SPNL 22GX7 QUINCKE BK (NEEDLE) ×1 IMPLANT
PACK ROBOT UROLOGY CUSTOM (CUSTOM PROCEDURE TRAY) ×1 IMPLANT
PAD POSITIONING PINK XL (MISCELLANEOUS) ×1 IMPLANT
PLUG CATH AND CAP STRL 200 (CATHETERS) ×1 IMPLANT
PORT ACCESS TROCAR AIRSEAL 12 (TROCAR) ×1 IMPLANT
RELOAD STAPLE 45 4.1 GRN THCK (STAPLE) ×1 IMPLANT
SCISSORS LAP 5X45 EPIX DISP (ENDOMECHANICALS) IMPLANT
SCISSORS MNPLR CVD DVNC XI (INSTRUMENTS) ×1 IMPLANT
SEAL UNIV 5-12 XI (MISCELLANEOUS) ×4 IMPLANT
SET TRI-LUMEN FLTR TB AIRSEAL (TUBING) ×1 IMPLANT
SOL PREP POV-IOD 4OZ 10% (MISCELLANEOUS) ×1 IMPLANT
SOLUTION ELECTROSURG ANTI STCK (MISCELLANEOUS) ×1 IMPLANT
SPIKE FLUID TRANSFER (MISCELLANEOUS) ×1 IMPLANT
SPONGE T-LAP 4X18 ~~LOC~~+RFID (SPONGE) ×1 IMPLANT
SUT ETHILON 3 0 PS 1 (SUTURE) ×1 IMPLANT
SUT MNCRL AB 4-0 PS2 18 (SUTURE) ×2 IMPLANT
SUT PDS AB 1 CT1 27 (SUTURE) ×2 IMPLANT
SUT VIC AB 2-0 SH 27X BRD (SUTURE) ×1 IMPLANT
SUT VICRYL 0 UR6 27IN ABS (SUTURE) ×1 IMPLANT
SUTURE VLOC BRB 180 ABS3/0GR12 (SUTURE) ×3 IMPLANT
SYR 27GX1/2 1ML LL SAFETY (SYRINGE) ×1 IMPLANT
WATER STERILE IRR 1000ML POUR (IV SOLUTION) ×1 IMPLANT

## 2024-08-08 NOTE — Op Note (Signed)
 NAMEGEORGES, Greg Kemp MEDICAL RECORD NO: [id number] ACCOUNT NO: [id number] DATE OF BIRTH: 1960-12-28 FACILITY: [facility] LOCATION: [id number] PHYSICIAN: Ricardo Likens, MD  Operative Report   DATE OF PROCEDURE: 08/08/2024  SURGEON: Ricardo Likens, MD  PREOPERATIVE DIAGNOSIS: Moderate risk prostate cancer.  PROCEDURES PERFORMED: 1. Robotic-assisted laparoscopic radical prostatectomy. 2. Bilateral pelvic lymph node dissection. 3. Sentinel lymph node mapping.  ASSISTANT: Maurilio Agar, MD  ESTIMATED BLOOD LOSS: 100 mL.  COMPLICATIONS: None.  SPECIMENS: 1. Periprostatic fat. 2. Radical prostatectomy. 3. Right external iliac lymph nodes. 4. Right obturator lymph nodes. 5. Left external iliac lymph nodes. 6. Left obturator lymph nodes, sentinel.  FINDINGS: 1. Sentinel lymph nodes in the left obturator group. 2. Large volume of subcutaneous fat.  INDICATIONS: The patient is a very pleasant 63 year old man who has a markedly elevated PSA and multifocal adenocarcinoma of the prostate, moderate risk.  Staging imaging was negative for distant disease. Options were discussed for management including  surveillance protocols versus ablative therapy versus surgical extirpation and he elected to proceed with prostatectomy with curative intent. He presents for this today. Informed consent was obtained and placed on the medical record.  DESCRIPTION OF PROCEDURE:  The patient being identified and verified and the procedure being radical prostatectomy with lymph node dissection was confirmed.  Procedure timeout was performed.  Intravenous antibiotics were administered.  General  endotracheal anesthesia was induced.  The patient was placed into a low lithotomy position.  A sterile field was created, prepped and draped the patient's penis, perineum and proximal thighs using iodine and his infraxiphoid abdomen using chlorhexidine   gluconate after clipper shaving and after he was further fastened to the  operative table using a 3-inch tape over foam padding across the supraxiphoid chest, a test of steep Trendelenburg position was performed and found to be suitably positioned.  The  arms were tucked to the side and padded with gel rolls.  An LAVH type drape was placed. A Foley catheter was placed to a straight drain.  Next, a high flow, low pressure pneumoperitoneum was created using the Veress technique in a supraumbilical midline after having passed the aspiration drop test.  An 8 mm robotic camera port was then placed in same location.   Laparoscopic examination of  the peritoneal cavity revealed no significant adhesions. No visceral injury. There was a significant amount of subcutaneous fat; however, intra-abdominal fat was more typical.  Additional ports were then placed as follows: Right paramedian 8-mm robotic port, right far lateral 12-mm AirSeal assist port, right paramedian 5-mm suction port, left paramedian 8-mm robotic port, and left far lateral 8-mm robotic port.  The robot was  docked and passed the electronic checks.  Attention was directed at the development of the space of Retzius.  An incision was made lateral to the right medial umbilical ligament from the midline towards the internal ring, coursing along the iliac vessels towards the area of the right ureter. The  right vas deferens was encountered, ligated, and used as a medial bucket handle.  Right bladder wall was swept away from the pelvic sidewall towards the area of the endopelvic fascia on the right side. Mirror image dissection was performed on the left  side. Anterior attachments were taken down with cautery scissors, exposing the anterior base of the prostate. It was defatted to better delineate the bladder neck-prostate junction.  This fatty tissue was set aside labeled as periprostatic fat.   Next, 0.2 mL of indocyanine green dye was injected in each lobe of the  prostate using a percutaneously placed robotically guided  spinal needle for sentinel lymph node angiography.  The endopelvic fascia was then identified and swept away from the lateral  aspect of the prostate in a base to apex orientation.  This exposed the dorsal venous complex, which was carefully controlled using a green load stapler, taking exquisite care to avoid membranous urethral injury, it did not occur.  It had been  approximately 10 minutes post dye injection.  The pelvis was inspected under near-infrared fluorescent light.  Sentinel lymph node angiography revealed excellent parenchymal uptake of the prostate and multiple sentinel lymph node channels coursing towards the pelvic lymph nodes on the left side, mostly towards the left obturator group respectively. As such, a  standard template lymphadenectomy was performed on the right side first, the right external iliac group with boundaries being right external iliac artery and vein, and pelvic sidewall.  Lymphostasis was achieved with cold clips and set aside labeled  right external iliac lymph nodes. Next, the right obturator group was dissected free with boundaries being the external iliac vein, pelvic sidewall, and obturator nerve. Lymphostasis was achieved with cold clips and set aside labeled right obturator  lymph nodes. The right obturator nerve was inspected following this and the nerve was found to be uninjured.  A mirror image lymph node dissection was performed on the left side of the left external iliac and left obturator group respectively.  Notably,  there were several sentinel lymph nodes within the obturator group.  These were labeled as such and the left obturator nerve was also inspected and found to be uninjured.  Attention was directed to the bladder neck dissection. The bladder neck was identified by moving the Foley catheter back and forth and a very aggressive lateral release was performed on each side to better delineate the bladder neck-prostate junction and  the bladder neck  was carefully separated away from the prostate in an anterior-to-posterior direction keeping what appeared to be a rim of circular muscle fibers at each plane of dissection, performing essentially complete bladder neck preservation.   Posterior dissection was performed by incising approximately 7 mm inferior and posterior to the posterior lip of the prostate, entering the plane of Denonvilliers.  Bilateral vas deferens were encountered and dissected for a distance of approximately 4  cm, ligated, and placed on gentle superior traction.  Bilateral seminal vesicles were dissected free to their tips and placed on gentle superior traction. Dissection was then continued inferiorly towards the area of the apex of the prostate, which was  delineated by its anterior curvature.   Attention was then directed at the pedicle dissection, first on the right side and a very purposeful wide dissection was performed using a sequential clipping technique and base to apex orientation, performing essentially no nerve sparing on the right  side given the patient's predominant right-sided and right lateral disease.   Left-sided pedicle dissection was performed, but performing moderately aggressive nerve sparing on the left given the paucity of disease on this side.   Final apical dissection was performed in the anterior plane, placing the prostate on superior traction, transecting the membranous urethra coldly.  This completely freed up the prostate specimen and it was placed in an EndoCatch bag for later retrieval.     Next, a digital rectal exam was then performed using an indicator glove, and no evidence of rectal violation was noted.  Posterior reconstruction was performed by reapproximating the posterior bladder neck and the posterior urethral plate using  a running  3-0 V-Loc. That brought the structures into tension-free apposition. The mucosal-to mucosa anastomosis was performed from the 6 o'clock to 12 o'clock  position circumferentially, which revealed an excellent tension-free apposition.   The new Foley catheter was placed and irrigated qualitatively.  Sponge and needle counts were correct. Hemostasis was excellent.  We achieved the goal as the extirpative portion of the procedure today.  A closed-suction drain was brought through the left lateralmost robotic port site into the area of the peritoneal cavity. The previous right lateralmost assistant port site was closed at the level of the fascia using a Carter-Thomason suture passer with  0 Vicryl.  The specimen was retrieved by extending the previous camera port site inferiorly to a total distance of approximately 4 cm, removing the prostatectomy specimen and setting aside for permanent pathology.  The extraction site was closed to the  level of the fascia using figure-of-eight PDS x 4, followed by reapproximation of Scarpa fascia with running Vicryl.  All incision sites were infiltrated with dilute liposomal Marcaine and closed at the level of the skin using subcuticular Monocryl  followed by Dermabond.   The procedure was terminated.  The patient tolerated the procedure well. No immediate periprocedural complications.  The patient was taken to the postanesthesia care unit in stable condition.  Plan for observation and admission.   MUK D: 08/08/2024 2:01:59 pm T: 08/08/2024 9:54:00 pm  JOB: [id number]/ [id number]

## 2024-08-08 NOTE — Brief Op Note (Signed)
 08/08/2024  1:54 PM  PATIENT:  Greg Kemp  63 y.o. male  PRE-OPERATIVE DIAGNOSIS:  PROSTATE CANCER  POST-OPERATIVE DIAGNOSIS:  PROSTATE CANCER  PROCEDURE:  Procedure(s): ROBOTIC ASSISTED LAPAROSCOPIC RADICAL PROSTATECTOMY (N/A) CYSTOSCOPY WITH INDOCYANINE GREEN IMAGING (ICG) (N/A) LYMPHADENECTOMY, PELVIS, ROBOT-ASSISTED (N/A)  SURGEON:  Surgeons and Role:    * Manny, Ricardo KATHEE Raddle., MD - Primary  PHYSICIAN ASSISTANT:   ASSISTANTS: Maurilio Agar MD   ANESTHESIA:   local and general  EBL:  100 mL   BLOOD ADMINISTERED:none  DRAINS: 1- JP to bulb; 2- Foley to graviry   LOCAL MEDICATIONS USED:  MARCAINE     SPECIMEN:  Source of Specimen:  1 - periprostatic fat; 2- pelvic lymph nodes; 3 - prostatectomy  DISPOSITION OF SPECIMEN:  PATHOLOGY  COUNTS:  YES  TOURNIQUET:  * No tourniquets in log *  DICTATION: .Other Dictation: Dictation Number [id number]  PLAN OF CARE: Admit for overnight observation  PATIENT DISPOSITION:  PACU - hemodynamically stable.   Delay start of Pharmacological VTE agent (>24hrs) due to surgical blood loss or risk of bleeding: yes

## 2024-08-08 NOTE — Discharge Instructions (Signed)
Activity:  You are encouraged to ambulate frequently (about every hour during waking hours) to help prevent blood clots from forming in your legs or lungs.  However, you should not engage in any heavy lifting (> 10-15 lbs), strenuous activity, or straining. Diet: You should continue a clear liquid diet until passing gas from below.  Once this occurs, you may advance your diet to a soft diet that would be easy to digest (i.e soups, scrambled eggs, mashed potatoes, etc.) for 24 hours just as you would if getting over a bad stomach flu.  If tolerating this diet well for 24 hours, you may then begin eating regular food.  It will be normal to have some amount of bloating, nausea, and abdominal discomfort intermittently. Prescriptions:  You will be provided a prescription for pain medication to take as needed.  If your pain is not severe enough to require the prescription pain medication, you may take extra strength Tylenol instead.  You should also take an over the counter stool softener (Colace 100 mg twice daily) to avoid straining with bowel movements as the pain medication may constipate you. Finally, you will also be provided a prescription for an antibiotic to begin the day prior to your return visit in the office for catheter removal. Catheter care: You will be taught how to take care of the catheter by the nursing staff prior to discharge from the hospital.  You may use both a leg bag and the larger bedside bag but it is recommended to at least use the bigger bedside bag at nighttime as the leg bag is small and will fill up overnight and also does not drain as well when lying flat. You may periodically feel a strong urge to void with the catheter in place.  This is a bladder spasm and most often can occur when having a bowel movement or when you are moving around. It is typically self-limited and usually will stop after a few minutes.  You may use some Vaseline or Neosporin around the tip of the catheter to  reduce friction at the tip of the penis. Incisions: You may remove your dressing bandages the 2nd day after surgery.  You most likely will have a few small staples in each of the incisions and once the bandages are removed, the incisions may stay open to air.  You may start showering (not soaking or bathing in water) 48 hours after surgery and the incisions simply need to be patted dry after the shower.  No additional care is needed. What to call us about: You should call the office ([phone]) if you develop fever > 101, persistent vomiting, or the catheter stops draining. Also, feel free to call with any other questions you may have and remember the handout that was provided to you as a reference preoperatively which answers many of the common questions that arise after surgery.  

## 2024-08-08 NOTE — H&P (Signed)
 Greg Kemp is an 63 y.o. male.    Chief Complaint: Pre-OP Radical Prostatectomy / Node Dissection  HPI:   1 - Moderate Risk Prostate Cancer - 4/14 cores up to 60% mix grade 1/2 cancer on eval PSA 5.3 2025. TRUS 43mL no median. MRI low/mod risk with Rt P3 lesion during eval wit Dr. Sherrilee.   2 - Lower Urinary Tract Symptoms - on tamsulosin  at basline. PVR 06/2024 223 mL.   PMH sig for obesity (has lost significant weight), TNA. No chest / abd surgeries. NO ischemic CV disease. REtired [employer] then [employer]. Wife Greg Kemp is involved. HIs PCP is [facility] in [place].   Today Greg Kemp is seen to proceed with prostatectomy / node dissection. No interval fevers. Hgb 12.4, Cr 1.8 most recently.   Past Medical History:  Diagnosis Date   Acid reflux    Anxiety    Cancer (HCC)    Cervical spinal stenosis    CHF (congestive heart failure) (HCC)    Chronic kidney disease    Stage III   Depression    Depression    GERD (gastroesophageal reflux disease)    Hyperlipidemia    Hypertension    Hypothyroidism    Morbid obesity (HCC)    Sleep apnea    Thyroid  disease     Past Surgical History:  Procedure Laterality Date   COLONOSCOPY WITH ESOPHAGOGASTRODUODENOSCOPY (EGD)  2010   Dr. Shaaron: ulcerative/erosive reflux esophagitis, noncritical schatki's ring, multiple bulbar erosions with normal sb biopsies, colonic diverticulosis, normal TI   COLONOSCOPY WITH PROPOFOL  N/A 10/30/2019   Procedure: COLONOSCOPY WITH PROPOFOL ;  Surgeon: Shaaron Lamar HERO, MD;  [facility];  Service: Endoscopy;  Laterality: N/A;  1:30pm   COLONOSCOPY WITH PROPOFOL  N/A 02/16/2022   Procedure: COLONOSCOPY WITH PROPOFOL ;  Surgeon: Shaaron Lamar HERO, MD;  [facility];  Service: Endoscopy;  Laterality: N/A;  12:00pm   ESOPHAGOGASTRODUODENOSCOPY (EGD) WITH PROPOFOL  N/A 02/16/2022   Procedure: ESOPHAGOGASTRODUODENOSCOPY (EGD) WITH PROPOFOL ;  Surgeon: Shaaron Lamar HERO, MD;  [facility];   Service: Endoscopy;  Laterality: N/A;   POLYPECTOMY  10/30/2019   Procedure: POLYPECTOMY;  Surgeon: Shaaron Lamar HERO, MD;  [facility];  Service: Endoscopy;;   POLYPECTOMY  02/16/2022   Procedure: POLYPECTOMY INTESTINAL;  Surgeon: Shaaron Lamar HERO, MD;  [facility];  Service: Endoscopy;;   TONSILLECTOMY     Upper and Lower GI     WISDOM TOOTH EXTRACTION      Family History  Problem Relation Age of Onset   Alcohol abuse Father    Colon cancer Father    COPD Father    Depression Daughter        acute   Sexual abuse Daughter    Lung disease Mother    COPD Mother    Thyroid  disease Mother    Alcohol abuse Paternal Uncle    Colon cancer Daughter 23   ADD / ADHD Neg Hx    Anxiety disorder Neg Hx    OCD Neg Hx    Drug abuse Neg Hx    Bipolar disorder Neg Hx    Dementia Neg Hx    Paranoid behavior Neg Hx    Schizophrenia Neg Hx    Seizures Neg Hx    Physical abuse Neg Hx    Social History:  reports that he has never smoked. He has never used smokeless tobacco. He reports that he does not currently use alcohol. He reports that he  does not use drugs.  Allergies: No Known Allergies  No medications prior to admission.    No results found for this or any previous visit (from the past 48 hours). No results found.  Review of Systems  Constitutional:  Negative for chills and fever.  All other systems reviewed and are negative.   There were no vitals taken for this visit. Physical Exam Vitals reviewed.  Eyes:     Pupils: Pupils are equal, round, and reactive to light.  Cardiovascular:     Rate and Rhythm: Normal rate.  Pulmonary:     Effort: Pulmonary effort is normal.  Abdominal:     General: Abdomen is flat.  Genitourinary:    Comments: No CVAT at present Musculoskeletal:        General: Normal range of motion.  Skin:    General: Skin is warm.  Neurological:     General: No focal deficit present.     Mental Status: He is alert.  Psychiatric:         Mood and Affect: Mood normal.      Assessment/Plan  Proceed as planned with prostatectomy / node dissection. Risks, benefits, alternatives, expected peri-op course discussed previously and reiterated today.   Greg Kemp., MD 08/08/2024, 6:42 AM

## 2024-08-08 NOTE — Transfer of Care (Signed)
 Immediate Anesthesia Transfer of Care Note  Patient: Greg Kemp  Procedure(s) Performed: ROBOTIC ASSISTED LAPAROSCOPIC RADICAL PROSTATECTOMY CYSTOSCOPY WITH INDOCYANINE GREEN IMAGING (ICG) LYMPHADENECTOMY, PELVIS, ROBOT-ASSISTED  Patient Location: PACU  Anesthesia Type:General  Level of Consciousness: awake, alert , and oriented  Airway & Oxygen Therapy: Patient Spontanous Breathing and Patient connected to face mask oxygen  Post-op Assessment: Report given to RN and Post -op Vital signs reviewed and stable  Post vital signs: Reviewed and stable  Last Vitals:  Vitals Value Taken Time  BP 141/70 08/08/24 14:11  Temp    Pulse 80 08/08/24 14:14  Resp 16 08/08/24 14:14  SpO2 98 % 08/08/24 14:14  Vitals shown include unfiled device data.  Last Pain:  Vitals:   08/08/24 0909  TempSrc: Oral         Complications: No notable events documented.

## 2024-08-08 NOTE — Anesthesia Procedure Notes (Signed)
 Procedure Name: Intubation Date/Time: 08/08/2024 10:57 AM  Performed by: Buster Catheryn SAUNDERS, CRNAPre-anesthesia Checklist: Patient identified, Emergency Drugs available, Suction available and Patient being monitored Patient Re-evaluated:Patient Re-evaluated prior to induction Oxygen Delivery Method: Circle system utilized Preoxygenation: Pre-oxygenation with 100% oxygen Induction Type: IV induction Ventilation: Mask ventilation without difficulty Laryngoscope Size: Mac and 4 Tube type: Oral Tube size: 7.0 mm Number of attempts: 1 Airway Equipment and Method: Stylet and Oral airway Placement Confirmation: ETT inserted through vocal cords under direct vision, positive ETCO2 and breath sounds checked- equal and bilateral Secured at: 24 cm Tube secured with: Tape Dental Injury: Teeth and Oropharynx as per pre-operative assessment

## 2024-08-08 NOTE — Plan of Care (Signed)

## 2024-08-08 NOTE — Anesthesia Postprocedure Evaluation (Signed)
 Anesthesia Post Note  Patient: Greg Kemp  Procedure(s) Performed: ROBOTIC ASSISTED LAPAROSCOPIC RADICAL PROSTATECTOMY CYSTOSCOPY WITH INDOCYANINE GREEN IMAGING (ICG) LYMPHADENECTOMY, PELVIS, ROBOT-ASSISTED     Patient location during evaluation: PACU Anesthesia Type: General Level of consciousness: awake and alert Pain management: pain level controlled Vital Signs Assessment: post-procedure vital signs reviewed and stable Respiratory status: spontaneous breathing, nonlabored ventilation, respiratory function stable and patient connected to nasal cannula oxygen Cardiovascular status: blood pressure returned to baseline and stable Postop Assessment: no apparent nausea or vomiting Anesthetic complications: no   No notable events documented.  Last Vitals:  Vitals:   08/08/24 1500 08/08/24 1545  BP: 130/75 105/60  Pulse: 82 77  Resp: 17 16  Temp:  36.4 C  SpO2: 97% 98%    Last Pain:  Vitals:   08/08/24 1545  TempSrc: Oral  PainSc: 0-No pain                 Thom JONELLE Peoples

## 2024-08-09 ENCOUNTER — Encounter (HOSPITAL_COMMUNITY): Payer: Self-pay | Admitting: Urology

## 2024-08-09 DIAGNOSIS — C61 Malignant neoplasm of prostate: Secondary | ICD-10-CM | POA: Diagnosis not present

## 2024-08-09 LAB — CBC
HCT: 40.1 % (ref 39.0–52.0)
Hemoglobin: 12.3 g/dL — ABNORMAL LOW (ref 13.0–17.0)
MCH: 25.6 pg — ABNORMAL LOW (ref 26.0–34.0)
MCHC: 30.7 g/dL (ref 30.0–36.0)
MCV: 83.5 fL (ref 80.0–100.0)
Platelets: 219 K/uL (ref 150–400)
RBC: 4.8 MIL/uL (ref 4.22–5.81)
RDW: 16.7 % — ABNORMAL HIGH (ref 11.5–15.5)
WBC: 13.6 K/uL — ABNORMAL HIGH (ref 4.0–10.5)
nRBC: 0 % (ref 0.0–0.2)

## 2024-08-09 LAB — BASIC METABOLIC PANEL WITH GFR
Anion gap: 9 (ref 5–15)
BUN: 15 mg/dL (ref 8–23)
CO2: 25 mmol/L (ref 22–32)
Calcium: 8.8 mg/dL — ABNORMAL LOW (ref 8.9–10.3)
Chloride: 103 mmol/L (ref 98–111)
Creatinine, Ser: 1.55 mg/dL — ABNORMAL HIGH (ref 0.61–1.24)
GFR, Estimated: 49 mL/min — ABNORMAL LOW (ref >60)
Glucose, Bld: 147 mg/dL — ABNORMAL HIGH (ref 70–99)
Potassium: 4.6 mmol/L (ref 3.5–5.1)
Sodium: 137 mmol/L (ref 135–145)

## 2024-08-09 NOTE — Progress Notes (Signed)
   Interval: S/p RALP + BPLND 08/08/24 Cr 1.55 (from 1.8 pre-op) JP 250cc overnight 400cc UOP, although another ~300-400cc in bag on exam  Sitting upright in chair this AM, has already x2 ambulation. Family at bedside Feeling well, no major complaints Mild bloating sensation, burping, no flatus, no BM Low appetite, drinking PO fluids well   Physical Exam: BP 129/68 (BP Location: Right Arm)   Pulse 87   Temp 98.2 F (36.8 C) (Oral)   Resp 17   Ht 6' (1.829 m)   Wt 121.1 kg   SpO2 99%   BMI 36.21 kg/m    Constitutional:  Alert and oriented, No acute distress. Respiratory: Normal respiratory effort, no increased work of breathing. GI: Abdomen is soft, non-tender, non-distended. Port incisions c/d/i GU: 40F catheter in place, clear to light pink urine in tubing  Drains: JP with s/s in bulb, ~additional 100cc in bulb  Laboratory Data: Cr 1.55 (from 1.8 pre-op)  Pertinent Imaging: N/A  Assessment & Plan:   POD1 RALP  Overall doing very well, expected recovery   - lower UOP, but with progressing PO intake. Will leave on mIVF today, may DC this evening - close monitoring on JP, likely peritoneal- consider JP Cr if >300-400cc tomorrow  - Regular diet - up to chair, ambulate 3-5x daily  - Plan to keep in house through today, expect DC tomorrow AM  Penne JONELLE Skye, MD

## 2024-08-09 NOTE — Care Management Obs Status (Signed)
 MEDICARE OBSERVATION STATUS NOTIFICATION   Patient Details  Name: CRESTON KLAS MRN: [id number] Date of Birth: [birth year]   Medicare Observation Status Notification Given:  Yes    Jon ONEIDA Anon, RN 08/09/2024, 3:58 PM

## 2024-08-09 NOTE — Progress Notes (Signed)
   08/09/24 1622  TOC Brief Assessment  Insurance and Status Reviewed  Patient has primary care physician Yes  Home environment has been reviewed Home with spouse  Prior level of function: Independent with ADL's  Prior/Current Home Services No current home services  Social Drivers of Health Review SDOH reviewed no interventions necessary  Readmission risk has been reviewed Yes  Transition of care needs no transition of care needs at this time

## 2024-08-10 DIAGNOSIS — C61 Malignant neoplasm of prostate: Secondary | ICD-10-CM | POA: Diagnosis not present

## 2024-08-10 LAB — CREATININE, FLUID (PLEURAL, PERITONEAL, JP DRAINAGE): Creat, Fluid: 1.5 mg/dL

## 2024-08-10 NOTE — Discharge Summary (Signed)
 Date of admission: 08/08/2024  Date of discharge: 08/10/2024  Admission diagnosis: prostate Ca  Discharge diagnosis: prostate Ca  Secondary diagnoses:  Patient Active Problem List   Diagnosis Date Noted   Prostate cancer (HCC) 08/08/2024   Leg swelling 01/14/2024   Congestive heart failure (CHF) (HCC) 01/14/2024   HTN (hypertension) 01/14/2024   Constipation 08/11/2019   FH: colon cancer in first degree relative <66 years old 08/11/2019   Memory difficulties 01/23/2013   Insomnia due to mental disorder 08/13/2012   Anxiety 08/13/2012   Pain 08/13/2012   Bipolar 1 disorder (HCC) 06/28/2010   CHEST PAIN 06/28/2010   IRRITABLE BOWEL SYNDROME 05/14/2009   MELENA, HX OF 02/23/2009   Hypothyroidism 11/17/2008   BRONCHITIS 11/17/2008   GERD 11/17/2008   NAUSEA AND VOMITING 11/17/2008   Diarrhea 11/17/2008   ABDOMINAL PAIN, LEFT LOWER QUADRANT 11/17/2008   TONSILLECTOMY, HX OF 11/17/2008    History and Physical: For full details, please see admission history and physical. Briefly, Greg Kemp is a 63 y.o. year old patient S/p RALP + BPLND 08/08/24.   Hospital Course: Patient tolerated the procedure well.  He was then transferred to the floor after an uneventful PACU stay.  His hospital course was uncomplicated.  On POD#2 he had met discharge criteria: was eating a regular diet, was up and ambulating independently,  pain was well controlled,. JP creatinine normal- JP drain removed prior to discharge.    Laboratory values:  Recent Labs    08/08/24 1437 08/09/24 0352  WBC  --  13.6*  HGB 12.4* 12.3*  HCT 39.8 40.1   Recent Labs    08/09/24 0352  NA 137  K 4.6  CL 103  CO2 25  GLUCOSE 147*  BUN 15  CREATININE 1.55*  CALCIUM 8.8*   No results for input(s): LABPT, INR in the last 72 hours. No results for input(s): LABURIN in the last 72 hours. Results for orders placed or performed in visit on 04/04/24  Microscopic Examination     Status: None   Collection  Time: 04/04/24 11:03 AM   Urine  Result Value Ref Range Status   WBC, UA 0-5 0 - 5 /hpf Final   RBC, Urine 0-2 0 - 2 /hpf Final   Epithelial Cells (non renal) 0-10 0 - 10 /hpf Final   Bacteria, UA None seen None seen/Few Final    Disposition: Home  Discharge instruction: The patient was instructed to be ambulatory but told to refrain from heavy lifting, strenuous activity, or driving.   What to call us  about: You should call the office [phone]) if you develop fever > 101 or develop persistent vomiting. Activity:  You are encouraged to ambulate frequently (about every hour during waking hours) to help prevent blood clots from forming in your legs or lungs.  However, you should not engage in any heavy lifting (> 10-15 lbs), strenuous activity, or straining.  Discharge medications:  Allergies as of 08/10/2024   No Known Allergies      Medication List     STOP taking these medications    tamsulosin  0.4 MG Caps capsule Commonly known as: FLOMAX        TAKE these medications    ALPRAZolam  1 MG tablet Commonly known as: XANAX  Take 1 tablet (1 mg total) by mouth 3 (three) times daily as needed for anxiety.   cyclobenzaprine 10 MG tablet Commonly known as: FLEXERIL Take 10 mg by mouth 2 (two) times daily as needed for muscle spasms.  diphenhydrAMINE 25 MG tablet Commonly known as: BENADRYL Take 50 mg by mouth at bedtime.   DULoxetine  60 MG capsule Commonly known as: CYMBALTA  Take 1 capsule (60 mg total) by mouth daily.   gabapentin  300 MG capsule Commonly known as: NEURONTIN  Take 1 capsule (300 mg total) by mouth 3 (three) times daily as needed (pain).   GLUCOSAMINE-CHONDROITIN PO Take 2 tablets by mouth daily.   levothyroxine 137 MCG tablet Commonly known as: SYNTHROID Take 137 mcg by mouth daily before breakfast.   oxyCODONE-acetaminophen  5-325 MG tablet Commonly known as: Percocet Take 1 tablet by mouth every 6 (six) hours as needed for moderate pain  (pain score 4-6) or severe pain (pain score 7-10) (post-operatively).   pantoprazole 40 MG tablet Commonly known as: PROTONIX Take 40 mg by mouth daily.   senna-docusate 8.6-50 MG tablet Commonly known as: Senokot-S Take 1 tablet by mouth 2 (two) times daily. While taking strong pain meds to prevent constipation.   sildenafil  100 MG tablet Commonly known as: VIAGRA  Take 1 tablet (100 mg total) by mouth daily as needed for erectile dysfunction.   sulfamethoxazole-trimethoprim 800-160 MG tablet Commonly known as: BACTRIM DS Take 1 tablet by mouth 2 (two) times daily. X 2 days. Begin day before next Urology appointment.   traZODone  100 MG tablet Commonly known as: DESYREL  Take 2 tablets (200 mg total) by mouth at bedtime.        Followup:   Follow-up Information     Alvaro Ricardo KATHEE Raddle., MD Follow up on 08/18/2024.   Specialty: Urology Why: at [facility] for MD visit, pathology review, and catheter removal. Contact information: [address] [place] [address] [phone]

## 2024-08-11 LAB — TYPE AND SCREEN
ABO/RH(D): O NEG
Antibody Screen: NEGATIVE

## 2024-08-12 LAB — SURGICAL PATHOLOGY

## 2024-08-18 DIAGNOSIS — N5201 Erectile dysfunction due to arterial insufficiency: Secondary | ICD-10-CM | POA: Diagnosis not present

## 2024-08-18 DIAGNOSIS — N393 Stress incontinence (female) (male): Secondary | ICD-10-CM | POA: Diagnosis not present

## 2024-08-18 DIAGNOSIS — C61 Malignant neoplasm of prostate: Secondary | ICD-10-CM | POA: Diagnosis not present

## 2024-08-20 DIAGNOSIS — Z6837 Body mass index (BMI) 37.0-37.9, adult: Secondary | ICD-10-CM | POA: Diagnosis not present

## 2024-08-20 DIAGNOSIS — E039 Hypothyroidism, unspecified: Secondary | ICD-10-CM | POA: Diagnosis not present

## 2024-08-20 DIAGNOSIS — N400 Enlarged prostate without lower urinary tract symptoms: Secondary | ICD-10-CM | POA: Diagnosis not present

## 2024-08-20 DIAGNOSIS — Z8546 Personal history of malignant neoplasm of prostate: Secondary | ICD-10-CM | POA: Diagnosis not present

## 2024-08-20 DIAGNOSIS — G4700 Insomnia, unspecified: Secondary | ICD-10-CM | POA: Diagnosis not present

## 2024-08-20 DIAGNOSIS — K219 Gastro-esophageal reflux disease without esophagitis: Secondary | ICD-10-CM | POA: Diagnosis not present

## 2024-08-20 DIAGNOSIS — M62838 Other muscle spasm: Secondary | ICD-10-CM | POA: Diagnosis not present

## 2024-08-20 DIAGNOSIS — R32 Unspecified urinary incontinence: Secondary | ICD-10-CM | POA: Diagnosis not present

## 2024-08-20 DIAGNOSIS — F411 Generalized anxiety disorder: Secondary | ICD-10-CM | POA: Diagnosis not present

## 2024-08-20 DIAGNOSIS — I10 Essential (primary) hypertension: Secondary | ICD-10-CM | POA: Diagnosis not present

## 2024-08-20 DIAGNOSIS — N529 Male erectile dysfunction, unspecified: Secondary | ICD-10-CM | POA: Diagnosis not present

## 2024-08-20 DIAGNOSIS — E785 Hyperlipidemia, unspecified: Secondary | ICD-10-CM | POA: Diagnosis not present

## 2024-08-25 ENCOUNTER — Ambulatory Visit: Attending: Internal Medicine | Admitting: Internal Medicine

## 2024-08-25 VITALS — BP 138/72 | HR 82 | Ht 72.0 in | Wt 278.8 lb

## 2024-08-25 DIAGNOSIS — I509 Heart failure, unspecified: Secondary | ICD-10-CM

## 2024-08-25 DIAGNOSIS — I10 Essential (primary) hypertension: Secondary | ICD-10-CM | POA: Diagnosis not present

## 2024-08-25 NOTE — Progress Notes (Signed)
 Cardiology Office Note  Date: 08/25/2024   ID: Greg Kemp, Greg Kemp [birth year], MRN [id number]  PCP:  Shona Norleen PEDLAR, MD  Cardiologist:  None Electrophysiologist:  None   History of Present Illness: Greg Kemp is a 63 y.o. male known to have HTN is here for follow-up visit of ADHF.  Initially referred to cardiology clinic for evaluation of ADHF symptoms.  He had DOE, bilateral leg swelling and weight gain in the late 2024 and early 2025.  The symptoms resolved with p.o. Lasix.  Thereafter, he was on as needed Lasix.  He reported that he was under a lot of stress when he had ADHD symptoms.  Now he is dealing with family stress better.  And hence not experiencing any more symptoms.  No DOE or angina.  No leg swelling.  Active at baseline.  He does a lot of yard work.  Past Medical History:  Diagnosis Date   Acid reflux    Anxiety    Cancer (HCC)    Cervical spinal stenosis    CHF (congestive heart failure) (HCC)    Chronic kidney disease    Stage III   Depression    Depression    GERD (gastroesophageal reflux disease)    Hyperlipidemia    Hypertension    Hypothyroidism    Morbid obesity (HCC)    Sleep apnea    Thyroid  disease     Past Surgical History:  Procedure Laterality Date   COLONOSCOPY WITH ESOPHAGOGASTRODUODENOSCOPY (EGD)  2010   Dr. Shaaron: ulcerative/erosive reflux esophagitis, noncritical schatki's ring, multiple bulbar erosions with normal sb biopsies, colonic diverticulosis, normal TI   COLONOSCOPY WITH PROPOFOL  N/A 10/30/2019   Procedure: COLONOSCOPY WITH PROPOFOL ;  Surgeon: Shaaron Lamar HERO, MD;  [facility];  Service: Endoscopy;  Laterality: N/A;  1:30pm   COLONOSCOPY WITH PROPOFOL  N/A 02/16/2022   Procedure: COLONOSCOPY WITH PROPOFOL ;  Surgeon: Shaaron Lamar HERO, MD;  [facility];  Service: Endoscopy;  Laterality: N/A;  12:00pm   CYSTOSCOPY WITH INDOCYANINE GREEN IMAGING (ICG) N/A 08/08/2024   Procedure: CYSTOSCOPY WITH INDOCYANINE  GREEN IMAGING (ICG);  Surgeon: Alvaro Ricardo KATHEE Mickey., MD;  Location: [facility];  Service: Urology;  Laterality: N/A;   ESOPHAGOGASTRODUODENOSCOPY (EGD) WITH PROPOFOL  N/A 02/16/2022   Procedure: ESOPHAGOGASTRODUODENOSCOPY (EGD) WITH PROPOFOL ;  Surgeon: Shaaron Lamar HERO, MD;  [facility];  Service: Endoscopy;  Laterality: N/A;   POLYPECTOMY  10/30/2019   Procedure: POLYPECTOMY;  Surgeon: Shaaron Lamar HERO, MD;  [facility];  Service: Endoscopy;;   POLYPECTOMY  02/16/2022   Procedure: POLYPECTOMY INTESTINAL;  Surgeon: Shaaron Lamar HERO, MD;  [facility];  Service: Endoscopy;;   ROBOT ASSISTED LAPAROSCOPIC RADICAL PROSTATECTOMY N/A 08/08/2024   Procedure: ROBOTIC ASSISTED LAPAROSCOPIC RADICAL PROSTATECTOMY;  Surgeon: Alvaro Ricardo KATHEE Mickey., MD;  Location: [facility];  Service: Urology;  Laterality: N/A;   TONSILLECTOMY     Upper and Lower GI     WISDOM TOOTH EXTRACTION      Current Outpatient Medications  Medication Sig Dispense Refill   ALPRAZolam  (XANAX ) 1 MG tablet Take 1 tablet (1 mg total) by mouth 3 (three) times daily as needed for anxiety. 90 tablet 2   cyclobenzaprine (FLEXERIL) 10 MG tablet Take 10 mg by mouth 2 (two) times daily as needed for muscle spasms.     diphenhydrAMINE (BENADRYL) 25 MG tablet Take 50 mg by mouth at bedtime.     DULoxetine  (CYMBALTA ) 60 MG capsule Take 1  capsule (60 mg total) by mouth daily. 90 capsule 1   gabapentin  (NEURONTIN ) 300 MG capsule Take 1 capsule (300 mg total) by mouth 3 (three) times daily as needed (pain). 90 capsule 3   GLUCOSAMINE-CHONDROITIN PO Take 2 tablets by mouth daily.     levothyroxine (SYNTHROID) 137 MCG tablet Take 137 mcg by mouth daily before breakfast.     oxyCODONE-acetaminophen  (PERCOCET) 5-325 MG tablet Take 1 tablet by mouth every 6 (six) hours as needed for moderate pain (pain score 4-6) or severe pain (pain score 7-10) (post-operatively). 15 tablet 0   pantoprazole (PROTONIX) 40 MG tablet Take 40 mg by  mouth daily.     senna-docusate (SENOKOT-S) 8.6-50 MG tablet Take 1 tablet by mouth 2 (two) times daily. While taking strong pain meds to prevent constipation. 10 tablet 0   sildenafil  (VIAGRA ) 100 MG tablet Take 1 tablet (100 mg total) by mouth daily as needed for erectile dysfunction. 6 tablet 0   sulfamethoxazole-trimethoprim (BACTRIM DS) 800-160 MG tablet Take 1 tablet by mouth 2 (two) times daily. X 2 days. Begin day before next Urology appointment. 6 tablet 0   traZODone  (DESYREL ) 100 MG tablet Take 2 tablets (200 mg total) by mouth at bedtime. 180 tablet 3   No current facility-administered medications for this visit.   Allergies:  Patient has no known allergies.   Social History: The patient  reports that he has never smoked. He has never used smokeless tobacco. He reports that he does not currently use alcohol. He reports that he does not use drugs.   Family History: The patient's family history includes Alcohol abuse in his father and paternal uncle; COPD in his father and mother; Colon cancer in his father; Colon cancer (age of onset: 65) in his daughter; Depression in his daughter; Lung disease in his mother; Sexual abuse in his daughter; Thyroid  disease in his mother.   ROS:  Please see the history of present illness. Otherwise, complete review of systems is positive for none  All other systems are reviewed and negative.   Physical Exam: VS:  There were no vitals taken for this visit., BMI There is no height or weight on file to calculate BMI.  Wt Readings from Last 3 Encounters:  08/08/24 267 lb (121.1 kg)  08/01/24 267 lb (121.1 kg)  05/05/24 288 lb 12.8 oz (131 kg)    General: Patient appears comfortable at rest. HEENT: Conjunctiva and lids normal, oropharynx clear with moist mucosa. Neck: Supple, no elevated JVP or carotid bruits, no thyromegaly. Lungs: Clear to auscultation, nonlabored breathing at rest. Cardiac: Regular rate and rhythm, no S3 or significant systolic  murmur, no pericardial rub. Abdomen: Soft, nontender, no hepatomegaly, bowel sounds present, no guarding or rebound. Extremities: No pitting edema, distal pulses 2+. Skin: Warm and dry. Musculoskeletal: No kyphosis. Neuropsychiatric: Alert and oriented x3, affect grossly appropriate.  Recent Labwork: 05/05/2024: ALT 20; AST 31; B Natriuretic Peptide 109.0; Magnesium 2.4 08/09/2024: BUN 15; Creatinine, Ser 1.55; Hemoglobin 12.3; Platelets 219; Potassium 4.6; Sodium 137     Component Value Date/Time   CHOL  06/16/2010 0510    166        ATP III CLASSIFICATION:  <200     mg/dL   Desirable  799-760  mg/dL   Borderline High  >=759    mg/dL   High          TRIG 833 (H) 06/16/2010 0510   HDL 29 (L) 06/16/2010 0510   CHOLHDL 5.7 06/16/2010 0510  VLDL 33 06/16/2010 0510   LDLCALC (H) 06/16/2010 0510    104        Total Cholesterol/HDL:CHD Risk Coronary Heart Disease Risk Table                     Men   Women  1/2 Average Risk   3.4   3.3  Average Risk       5.0   4.4  2 X Average Risk   9.6   7.1  3 X Average Risk  23.4   11.0        Use the calculated Patient Ratio above and the CHD Risk Table to determine the patient's CHD Risk.        ATP III CLASSIFICATION (LDL):  <100     mg/dL   Optimal  899-870  mg/dL   Near or Above                    Optimal  130-159  mg/dL   Borderline  839-810  mg/dL   High  >809     mg/dL   Very High     Assessment and Plan:   Congestive heart failure: Patient had symptoms of DOE, bilateral leg swelling and weight gain (during late 2024 and early 2025) that resolved after starting p.o. Lasix 40 mg once daily for couple of months.  Takes Lasix as needed now.  Echo normal.  CAC score 0.  No recurrences of ADHF or angina since then.  He reported that he was under a lot of stress at that time.  Probably he had transient Takotsubo cardiomyopathy at the time leading to heart failure symptoms.  If he has any recurrence of ADHF or angina, he will need a  repeat echocardiogram and PET/CT stress.  He verbalized understanding.  Not a candidate for CT cardiac due to CKD.  HTN, controlled: He lost a lot of weight.  BP down.  Taken off antihypertensive medications.  Follow with PCP.  20 minutes spent in reviewing prior medical records, more than 3 labs, discussion and documentation.  Medication Adjustments/Labs and Tests Ordered: Current medicines are reviewed at length with the patient today.  Concerns regarding medicines are outlined above.    Disposition:  Follow up 1 year  Signed Ladonte Verstraete Arleta Maywood, MD, 08/25/2024 10:51 AM    [facility] [address] [place], [place], [place] [address]

## 2024-08-25 NOTE — Patient Instructions (Signed)
 Medication Instructions:  Your physician recommends that you continue on your current medications as directed. Please refer to the Current Medication list given to you today.  *If you need a refill on your cardiac medications before your next appointment, please call your pharmacy*  Lab Work: None If you have labs (blood work) drawn today and your tests are completely normal, you will receive your results only by: MyChart Message (if you have MyChart) OR A paper copy in the mail If you have any lab test that is abnormal or we need to change your treatment, we will call you to review the results.  Testing/Procedures: Nonde  Follow-Up: At [facility], you and your health needs are our priority.  As part of our continuing mission to provide you with exceptional heart care, our providers are all part of one team.  This team includes your primary Cardiologist (physician) and Advanced Practice Providers or APPs (Physician Assistants and Nurse Practitioners) who all work together to provide you with the care you need, when you need it.  Your next appointment:   1 year(s)  Provider:   You may see Vishnu Mallipeddi, MD or one of the following Advanced Practice Providers on your designated Care Team:   Brittany Strader, PA-C  [place], [place] Olivia [place], [place]     We recommend signing up for the patient portal called MyChart.  Sign up information is provided on this After Visit Summary.  MyChart is used to connect with patients for Virtual Visits (Telemedicine).  Patients are able to view lab/test results, encounter notes, upcoming appointments, etc.  Non-urgent messages can be sent to your provider as well.   To learn more about what you can do with MyChart, go to [email or web address].   Other Instructions

## 2024-09-02 ENCOUNTER — Ambulatory Visit: Admitting: Internal Medicine

## 2024-09-10 ENCOUNTER — Ambulatory Visit (HOSPITAL_COMMUNITY): Admitting: Psychiatry

## 2024-09-16 ENCOUNTER — Other Ambulatory Visit (HOSPITAL_COMMUNITY): Payer: Self-pay | Admitting: Psychiatry

## 2024-09-16 NOTE — Telephone Encounter (Signed)
 Call for appt

## 2024-09-22 NOTE — Telephone Encounter (Signed)
 Called no answer left vm

## 2024-10-19 ENCOUNTER — Other Ambulatory Visit (HOSPITAL_COMMUNITY): Payer: Self-pay | Admitting: Psychiatry

## 2024-10-20 ENCOUNTER — Encounter (HOSPITAL_COMMUNITY): Payer: Self-pay | Admitting: Psychiatry

## 2024-10-20 ENCOUNTER — Ambulatory Visit (HOSPITAL_COMMUNITY): Admitting: Psychiatry

## 2024-10-20 VITALS — BP 153/83 | HR 66 | Ht 72.0 in | Wt 283.6 lb

## 2024-10-20 DIAGNOSIS — F5105 Insomnia due to other mental disorder: Secondary | ICD-10-CM

## 2024-10-20 DIAGNOSIS — F329 Major depressive disorder, single episode, unspecified: Secondary | ICD-10-CM

## 2024-10-20 DIAGNOSIS — F411 Generalized anxiety disorder: Secondary | ICD-10-CM | POA: Diagnosis not present

## 2024-10-20 DIAGNOSIS — G479 Sleep disorder, unspecified: Secondary | ICD-10-CM

## 2024-10-20 DIAGNOSIS — F332 Major depressive disorder, recurrent severe without psychotic features: Secondary | ICD-10-CM

## 2024-10-20 DIAGNOSIS — F419 Anxiety disorder, unspecified: Secondary | ICD-10-CM

## 2024-10-20 MED ORDER — TRAZODONE HCL 100 MG PO TABS: 200.0000 mg | ORAL_TABLET | Freq: Every day | ORAL | 3 refills | Status: AC

## 2024-10-20 MED ORDER — GABAPENTIN 300 MG PO CAPS: 300.0000 mg | ORAL_CAPSULE | Freq: Three times a day (TID) | ORAL | 3 refills | Status: AC | PRN

## 2024-10-20 MED ORDER — DULOXETINE HCL 60 MG PO CPEP: 60.0000 mg | ORAL_CAPSULE | Freq: Every day | ORAL | 1 refills | Status: AC

## 2024-10-20 MED ORDER — ALPRAZOLAM 1 MG PO TABS: 1.0000 mg | ORAL_TABLET | Freq: Three times a day (TID) | ORAL | 3 refills | Status: AC | PRN

## 2024-10-20 NOTE — Progress Notes (Signed)
 [facility] MD/PA/NP OP Progress Note  10/20/2024 11:42 AM Greg Kemp  MRN:  [id number]  Chief Complaint:  Chief Complaint  Patient presents with   Anxiety   Depression   Follow-up   HPI: This patient is a 64 year old white male who lives with his wife in [place].  He is on disability.  The patient returns for follow-up after 7 months regarding his major depression and generalized anxiety disorder.  He told me last time that he had an elevated PSA.  Since then he was diagnosed with adenocarcinoma in the prostate.  In October he had his prostate removed.  All of this has been stressful but he feels better now that it is over.  He still having some urinary incontinence but is working on Kegel exercises and going to the bathroom on a routine schedule.  He is back to doing a lot of things around his house again and getting a bit of exercise.  He and his wife are trying to eat healthy.  In terms of his depression he states he is doing well.  He was depressed before the surgery but now feels much better.  The Xanax  continues to help with his anxiety and trazodone  is helping with sleep.  He denies any thoughts of suicide or self-harm. Visit Diagnosis:    ICD-10-CM   1. Major depressive disorder, recurrent, severe without psychotic features (HCC)  F33.2     2. Anxiety  F41.9     3. Insomnia due to mental disorder  F51.05       Past Psychiatric History: Psychiatric hospitalization in 2012  Past Medical History:  Past Medical History:  Diagnosis Date   Acid reflux    Anxiety    Cancer (HCC)    Cervical spinal stenosis    CHF (congestive heart failure) (HCC)    Chronic kidney disease    Stage III   Depression    Depression    GERD (gastroesophageal reflux disease)    Hyperlipidemia    Hypertension    Hypothyroidism    Morbid obesity (HCC)    Sleep apnea    Thyroid  disease     Past Surgical History:  Procedure Laterality Date   COLONOSCOPY WITH ESOPHAGOGASTRODUODENOSCOPY (EGD)   2010   Dr. Shaaron: ulcerative/erosive reflux esophagitis, noncritical schatki's ring, multiple bulbar erosions with normal sb biopsies, colonic diverticulosis, normal TI   COLONOSCOPY WITH PROPOFOL  N/A 10/30/2019   Procedure: COLONOSCOPY WITH PROPOFOL ;  Surgeon: Shaaron Lamar HERO, MD;  [facility];  Service: Endoscopy;  Laterality: N/A;  1:30pm   COLONOSCOPY WITH PROPOFOL  N/A 02/16/2022   Procedure: COLONOSCOPY WITH PROPOFOL ;  Surgeon: Shaaron Lamar HERO, MD;  [facility];  Service: Endoscopy;  Laterality: N/A;  12:00pm   CYSTOSCOPY WITH INDOCYANINE GREEN  IMAGING (ICG) N/A 08/08/2024   Procedure: CYSTOSCOPY WITH INDOCYANINE GREEN  IMAGING (ICG);  Surgeon: Alvaro Ricardo KATHEE Mickey., MD;  Location: [facility];  Service: Urology;  Laterality: N/A;   ESOPHAGOGASTRODUODENOSCOPY (EGD) WITH PROPOFOL  N/A 02/16/2022   Procedure: ESOPHAGOGASTRODUODENOSCOPY (EGD) WITH PROPOFOL ;  Surgeon: Shaaron Lamar HERO, MD;  [facility];  Service: Endoscopy;  Laterality: N/A;   POLYPECTOMY  10/30/2019   Procedure: POLYPECTOMY;  Surgeon: Shaaron Lamar HERO, MD;  [facility];  Service: Endoscopy;;   POLYPECTOMY  02/16/2022   Procedure: POLYPECTOMY INTESTINAL;  Surgeon: Shaaron Lamar HERO, MD;  [facility];  Service: Endoscopy;;   ROBOT ASSISTED LAPAROSCOPIC RADICAL PROSTATECTOMY N/A 08/08/2024   Procedure: ROBOTIC ASSISTED  LAPAROSCOPIC RADICAL PROSTATECTOMY;  Surgeon: Alvaro Ricardo KATHEE Mickey., MD;  Location: [facility];  Service: Urology;  Laterality: N/A;   TONSILLECTOMY     Upper and Lower GI     WISDOM TOOTH EXTRACTION      Family Psychiatric History: See below  Family History:  Family History  Problem Relation Age of Onset   Alcohol abuse Father    Colon cancer Father    COPD Father    Depression Daughter        acute   Sexual abuse Daughter    Lung disease Mother    COPD Mother    Thyroid  disease Mother    Alcohol abuse Paternal Uncle    Colon cancer Daughter 35   ADD /  ADHD Neg Hx    Anxiety disorder Neg Hx    OCD Neg Hx    Drug abuse Neg Hx    Bipolar disorder Neg Hx    Dementia Neg Hx    Paranoid behavior Neg Hx    Schizophrenia Neg Hx    Seizures Neg Hx    Physical abuse Neg Hx     Social History:  Social History   Socioeconomic History   Marital status: Married    Spouse name: Not on file   Number of children: Not on file   Years of education: Not on file   Highest education level: Not on file  Occupational History   Not on file  Tobacco Use   Smoking status: Never   Smokeless tobacco: Never  Vaping Use   Vaping status: Never Used  Substance and Sexual Activity   Alcohol use: Not Currently    Comment: occ; update 10/26 I don't drink anymore   Drug use: No   Sexual activity: Yes    Birth control/protection: Surgical  Other Topics Concern   Not on file  Social History Narrative   Lives at home with wife   Caffeine: coffee 1 large cup/day   Social Drivers of Health   Tobacco Use: Low Risk (10/20/2024)   Patient History    Smoking Tobacco Use: Never    Smokeless Tobacco Use: Never    Passive Exposure: Not on file  Financial Resource Strain: Not on file  Food Insecurity: No Food Insecurity (08/08/2024)   Epic    Worried About Programme Researcher, Broadcasting/film/video in the Last Year: Never true    Ran Out of Food in the Last Year: Never true  Transportation Needs: No Transportation Needs (08/08/2024)   Epic    Lack of Transportation (Medical): No    Lack of Transportation (Non-Medical): No  Physical Activity: Not on file  Stress: Not on file  Social Connections: Moderately Integrated (08/08/2024)   Social Connection and Isolation Panel    Frequency of Communication with Friends and Family: More than three times a week    Frequency of Social Gatherings with Friends and Family: Three times a week    Attends Religious Services: 1 to 4 times per year    Active Member of Clubs or Organizations: No    Attends Banker Meetings:  Never    Marital Status: Married  Depression (PHQ2-9): Low Risk (10/20/2024)   Depression (PHQ2-9)    PHQ-2 Score: 4  Alcohol Screen: Not on file  Housing: Low Risk (08/08/2024)   Epic    Unable to Pay for Housing in the Last Year: No    Number of Times Moved in the Last Year: 0    Homeless in  the Last Year: No  Utilities: Not At Risk (08/08/2024)   Epic    Threatened with loss of utilities: No  Health Literacy: Not on file    Allergies: Allergies[1]  Metabolic Disorder Labs: No results found for: HGBA1C, MPG No results found for: PROLACTIN Lab Results  Component Value Date   CHOL  06/16/2010    166        ATP III CLASSIFICATION:  <200     mg/dL   Desirable  799-760  mg/dL   Borderline High  >=759    mg/dL   High          TRIG 833 (H) 06/16/2010   HDL 29 (L) 06/16/2010   CHOLHDL 5.7 06/16/2010   VLDL 33 06/16/2010   LDLCALC (H) 06/16/2010    104        Total Cholesterol/HDL:CHD Risk Coronary Heart Disease Risk Table                     Men   Women  1/2 Average Risk   3.4   3.3  Average Risk       5.0   4.4  2 X Average Risk   9.6   7.1  3 X Average Risk  23.4   11.0        Use the calculated Patient Ratio above and the CHD Risk Table to determine the patient's CHD Risk.        ATP III CLASSIFICATION (LDL):  <100     mg/dL   Optimal  899-870  mg/dL   Near or Above                    Optimal  130-159  mg/dL   Borderline  839-810  mg/dL   High  >809     mg/dL   Very High   Lab Results  Component Value Date   TSH 11.645 (H) 12/14/2010   TSH 0.264 (L) 06/16/2010    Therapeutic Level Labs: No results found for: LITHIUM No results found for: VALPROATE No results found for: CBMZ  Current Medications: Current Outpatient Medications  Medication Sig Dispense Refill   cyclobenzaprine  (FLEXERIL ) 10 MG tablet Take 10 mg by mouth 2 (two) times daily as needed for muscle spasms.     diphenhydrAMINE  (BENADRYL ) 25 MG tablet Take 50 mg by mouth at  bedtime.     gabapentin  (NEURONTIN ) 300 MG capsule Take 1 capsule (300 mg total) by mouth 3 (three) times daily as needed (pain). 90 capsule 3   levothyroxine  (SYNTHROID ) 137 MCG tablet Take 137 mcg by mouth daily before breakfast.     oxyCODONE -acetaminophen  (PERCOCET) 5-325 MG tablet Take 1 tablet by mouth every 6 (six) hours as needed for moderate pain (pain score 4-6) or severe pain (pain score 7-10) (post-operatively). 15 tablet 0   pantoprazole  (PROTONIX ) 40 MG tablet Take 40 mg by mouth daily.     senna-docusate (SENOKOT-S) 8.6-50 MG tablet Take 1 tablet by mouth 2 (two) times daily. While taking strong pain meds to prevent constipation. 10 tablet 0   sildenafil  (VIAGRA ) 100 MG tablet Take 1 tablet (100 mg total) by mouth daily as needed for erectile dysfunction. 6 tablet 0   sulfamethoxazole -trimethoprim  (BACTRIM  DS) 800-160 MG tablet Take 1 tablet by mouth 2 (two) times daily. X 2 days. Begin day before next Urology appointment. 6 tablet 0   ALPRAZolam  (XANAX ) 1 MG tablet Take 1 tablet (1 mg total) by mouth 3 (three)  times daily as needed for anxiety. 90 tablet 3   DULoxetine  (CYMBALTA ) 60 MG capsule Take 1 capsule (60 mg total) by mouth daily. 90 capsule 1   GLUCOSAMINE-CHONDROITIN PO Take 2 tablets by mouth daily.     traZODone  (DESYREL ) 100 MG tablet Take 2 tablets (200 mg total) by mouth at bedtime. 180 tablet 3   No current facility-administered medications for this visit.     Musculoskeletal: Strength & Muscle Tone: within normal limits Gait & Station: normal Patient leans: N/A  Psychiatric Specialty Exam: Review of Systems  Genitourinary:  Positive for enuresis.  All other systems reviewed and are negative.   Blood pressure (!) 153/83, pulse 66, height 6' (1.829 m), weight 283 lb 9.6 oz (128.6 kg), SpO2 98%.Body mass index is 38.46 kg/m.  General Appearance: Casual and Fairly Groomed  Eye Contact:  Good  Speech:  Clear and Coherent  Volume:  Normal  Mood:  Euthymic   Affect:  Congruent  Thought Process:  Goal Directed  Orientation:  Full (Time, Place, and Person)  Thought Content: WDL   Suicidal Thoughts:  No  Homicidal Thoughts:  No  Memory:  Immediate;   Good Recent;   Good Remote;   Good  Judgement:  Good  Insight:  Good  Psychomotor Activity:  Normal  Concentration:  Concentration: Good and Attention Span: Good  Recall:  Good  Fund of Knowledge: Good  Language: Good  Akathisia:  No  Handed:  Right  AIMS (if indicated): not done  Assets:  Communication Skills Desire for Improvement Resilience Social Support Talents/Skills  ADL's:  Intact  Cognition: WNL  Sleep:  Good   Screenings: GAD-7    Flowsheet Row Office Visit from 10/20/2024 in [facility] [place] Office Visit from 02/28/2024 in [facility] [place] Office Visit from 04/04/2023 in [facility] [place]  Total GAD-7 Score 0 0 7   Mini-Mental    Flowsheet Row Office Visit from 12/01/2020 in [facility]  Total Score (max 30 points ) 24   PHQ2-9    Flowsheet Row Office Visit from 10/20/2024 in [facility] [place] Office Visit from 02/28/2024 in [facility] [place] Office Visit from 04/04/2023 in [facility] [place] Video Visit from 11/06/2022 in [facility] at [place] Video Visit from 09/06/2022 in [facility] at [facility] Total Score 0 0 1 0 2  PHQ-9 Total Score 4 2 8  -- 9   Flowsheet Row Admission (Discharged) from 08/08/2024 in [place] [facility] 4TH FLOOR [facility] UROLOGY ED from 05/05/2024 in [facility] Emergency Department at [facility] Video Visit from 11/06/2022 in [facility] at [place]  C-SSRS RISK CATEGORY No Risk No Risk No  Risk     Assessment and Plan: This patient is a 64 year old male with a history of posttraumatic stress disorder and generalized anxiety disorder.  He continues to do well on his current regimen.  He will continue Cymbalta  60 mg daily for depression Xanax  1 mg up to 4 times daily as needed for generalized anxiety and trazodone  200 mg at bedtime for sleep and gabapentin  300 mg tid as needed for anxiety.  He will return to see me in 4 months  Collaboration of Care: Collaboration of Care: Primary Care Provider AEB notes will be shared with PCP at patient's  request  Patient/Guardian was advised Release of Information must be obtained prior to any record release in order to collaborate their care with an outside provider. Patient/Guardian was advised if they have not already done so to contact the registration department to sign all necessary forms in order for us  to release information regarding their care.   Consent: Patient/Guardian gives verbal consent for treatment and assignment of benefits for services provided during this visit. Patient/Guardian expressed understanding and agreed to proceed.    Barnie Gull, MD 10/20/2024, 11:42 AM     [1] No Known Allergies

## 2024-11-05 ENCOUNTER — Other Ambulatory Visit (HOSPITAL_COMMUNITY): Payer: Self-pay | Admitting: Psychiatry

## 2024-11-12 ENCOUNTER — Encounter: Payer: Self-pay | Admitting: Internal Medicine

## 2025-02-17 ENCOUNTER — Ambulatory Visit (HOSPITAL_COMMUNITY): Admitting: Psychiatry
